# Patient Record
Sex: Male | Born: 1960 | Race: White | Hispanic: No | Marital: Married | Smoking: Former smoker
Health system: Southern US, Community
[De-identification: ages and names within clinical notes are randomized; demographics above are authoritative.]

## PROBLEM LIST (undated history)

## (undated) DIAGNOSIS — E785 Hyperlipidemia, unspecified: Secondary | ICD-10-CM

## (undated) DIAGNOSIS — I10 Essential (primary) hypertension: Secondary | ICD-10-CM

## (undated) DIAGNOSIS — G4730 Sleep apnea, unspecified: Secondary | ICD-10-CM

## (undated) HISTORY — PX: KNEE ARTHROSCOPY: SHX127

## (undated) HISTORY — PX: CARDIOVASCULAR STRESS TEST: SHX262

## (undated) HISTORY — PX: BACK SURGERY: SHX140

## (undated) HISTORY — DX: Sleep apnea, unspecified: G47.30

## (undated) HISTORY — PX: JOINT REPLACEMENT: SHX530

## (undated) HISTORY — PX: KNEE ARTHROSCOPY WITH PATELLAR TENDON REPAIR: SHX5656

---
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
## 2004-11-25 ENCOUNTER — Ambulatory Visit: Payer: Self-pay | Admitting: Cardiology

## 2004-12-01 ENCOUNTER — Ambulatory Visit: Payer: Self-pay

## 2004-12-01 ENCOUNTER — Encounter: Payer: Self-pay | Admitting: Cardiology

## 2005-08-19 ENCOUNTER — Emergency Department (HOSPITAL_COMMUNITY): Admission: EM | Admit: 2005-08-19 | Discharge: 2005-08-19 | Payer: Self-pay | Admitting: Emergency Medicine

## 2006-01-16 HISTORY — PX: OTHER SURGICAL HISTORY: SHX169

## 2006-11-13 ENCOUNTER — Ambulatory Visit: Payer: Self-pay | Admitting: Cardiology

## 2006-11-13 ENCOUNTER — Inpatient Hospital Stay (HOSPITAL_COMMUNITY): Admission: EM | Admit: 2006-11-13 | Discharge: 2006-11-15 | Payer: Self-pay | Admitting: Emergency Medicine

## 2006-11-21 ENCOUNTER — Ambulatory Visit: Payer: Self-pay | Admitting: Cardiology

## 2006-12-06 ENCOUNTER — Ambulatory Visit: Payer: Self-pay | Admitting: Cardiology

## 2007-01-24 ENCOUNTER — Ambulatory Visit: Payer: Self-pay | Admitting: Cardiology

## 2007-03-05 ENCOUNTER — Ambulatory Visit: Payer: Self-pay | Admitting: Cardiology

## 2007-07-09 ENCOUNTER — Ambulatory Visit: Payer: Self-pay | Admitting: Cardiology

## 2007-11-05 ENCOUNTER — Ambulatory Visit: Payer: Self-pay | Admitting: Cardiology

## 2008-01-15 ENCOUNTER — Inpatient Hospital Stay (HOSPITAL_COMMUNITY): Admission: RE | Admit: 2008-01-15 | Discharge: 2008-01-18 | Payer: Self-pay | Admitting: Orthopedic Surgery

## 2008-02-06 ENCOUNTER — Ambulatory Visit: Payer: Self-pay | Admitting: Cardiology

## 2008-02-19 ENCOUNTER — Encounter: Admission: RE | Admit: 2008-02-19 | Discharge: 2008-03-18 | Payer: Self-pay | Admitting: Orthopedic Surgery

## 2008-05-05 ENCOUNTER — Ambulatory Visit: Payer: Self-pay | Admitting: Cardiology

## 2008-05-12 DIAGNOSIS — E785 Hyperlipidemia, unspecified: Secondary | ICD-10-CM | POA: Insufficient documentation

## 2008-05-12 DIAGNOSIS — I10 Essential (primary) hypertension: Secondary | ICD-10-CM | POA: Insufficient documentation

## 2008-11-05 ENCOUNTER — Ambulatory Visit: Payer: Self-pay | Admitting: Cardiology

## 2008-12-18 ENCOUNTER — Encounter (INDEPENDENT_AMBULATORY_CARE_PROVIDER_SITE_OTHER): Payer: Self-pay | Admitting: *Deleted

## 2009-01-16 HISTORY — PX: COLONOSCOPY: SHX174

## 2009-02-02 ENCOUNTER — Ambulatory Visit: Payer: Self-pay | Admitting: Cardiology

## 2009-03-12 ENCOUNTER — Ambulatory Visit: Payer: Self-pay | Admitting: Cardiology

## 2009-03-12 DIAGNOSIS — I214 Non-ST elevation (NSTEMI) myocardial infarction: Secondary | ICD-10-CM | POA: Insufficient documentation

## 2009-03-15 ENCOUNTER — Encounter: Payer: Self-pay | Admitting: Cardiology

## 2009-03-17 ENCOUNTER — Telehealth: Payer: Self-pay | Admitting: Cardiology

## 2009-04-13 ENCOUNTER — Encounter (INDEPENDENT_AMBULATORY_CARE_PROVIDER_SITE_OTHER): Payer: Self-pay | Admitting: *Deleted

## 2010-02-15 NOTE — Progress Notes (Signed)
Summary: questions about medication  Medications Added METOPROLOL SUCCINATE 25 MG XR24H-TAB (METOPROLOL SUCCINATE) Take one tablet by mouth daily       Phone Note Call from Patient Call back at [phone]   Caller: Patient Summary of Call: Pt have questions about medication Metoprolol 100mg  Initial call taken by: Judie Grieve,  March 17, 2009 8:43 AM  Follow-up for Phone Call        The pt states he has been taking Metoprolol succ 25mg  once daily. I explained the wrong dose may have been clicked on in the drop down box in EMR. I will correct this and send the right dose to his pharmacy. He states he will quarter the 100mg  tabs since he has already picked those up.  Follow-up by: Sherri Rad, RN, BSN,  March 17, 2009 10:59 AM    New/Updated Medications: METOPROLOL SUCCINATE 25 MG XR24H-TAB (METOPROLOL SUCCINATE) Take one tablet by mouth daily Prescriptions: METOPROLOL SUCCINATE 25 MG XR24H-TAB (METOPROLOL SUCCINATE) Take one tablet by mouth daily  #30 x 11   Entered by:   Sherri Rad, RN, BSN   Authorized by:   Lenoria Farrier, MD, [facility]   Signed by:   Sherri Rad, RN, BSN on 03/17/2009   Method used:   Electronically to        [facility]  [address] (retail)       [address]       [place], [place]  [address]       Ph: [phone]       Fax: [phone]   RxID:   [id number]

## 2010-02-15 NOTE — Miscellaneous (Signed)
  Clinical Lists Changes  Observations: Added new observation of RS STUDY: TRACER (12/18/2008 12:25)      Research Study Name: TRACER

## 2010-02-15 NOTE — Miscellaneous (Signed)
  Clinical Lists Changes  Observations: Added new observation of RS STUDY: TRACER - study completion 02/02/09 (04/13/2009 11:19)      Research Study Name: TRACER - study completion 02/02/09

## 2010-02-15 NOTE — Assessment & Plan Note (Signed)
Summary: F1Y  Medications Added LOTREL 5-10 MG CAPS (AMLODIPINE BESY-BENAZEPRIL HCL) one tab by mouth once daily ASPIRIN 81 MG TBEC (ASPIRIN) Take one tablet by mouth daily METOPROLOL SUCCINATE 100 MG XR24H-TAB (METOPROLOL SUCCINATE) Take one tablet by mouth daily        Visit Type:  Follow-up Primary Provider:  [facility]  CC:  no complaints.  History of Present Illness: Patient is 50 years old and return for management following a previous non-SLS an MI in 2008. He had catheterization at that time and was thought to have occlusion of a very small vessel. The rest of his arteries were clean. He was enrolled in the pacer trial and he completed this just recently.  Last year he developed a skin rash that we thought may be related to the statin and this was discontinued. We did not put him on another statin because his coronaries were clean.  His other major promise of hypertension.  He's been quite active. 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 He's had no symptoms related to this.  Current Medications (verified): 1)  Lotrel 5-10 Mg Caps (Amlodipine Besy-Benazepril Hcl) .... One Tab By Mouth Once Daily 2)  Aspirin 81 Mg Tbec (Aspirin) .... Take One Tablet By Mouth Daily 3)  Metoprolol Succinate 100 Mg Xr24h-Tab (Metoprolol Succinate) .... Take One Tablet By Mouth Daily  Past History:  Past Medical History: Reviewed history from 05/12/2008 and no changes required. HYPERTENSION, UNSPECIFIED (ICD-401.9) HYPERLIPIDEMIA-MIXED (ICD-272.4)    Review of Systems       ROS is negative except as outlined in HPI.   Vital Signs:  Patient profile:   50 year old male Height:      72 inches Weight:      192 pounds BMI:     26.13 Pulse rate:   69 / minute BP sitting:   116 / 75  (left arm) Cuff size:   regular  Vitals Entered By: Burnett Kanaris, CNA (March 12, 2009 10:36 AM)  Physical Exam  Additional Exam:  Gen.  Well-nourished, in no distress   Neck: No JVD, thyroid not enlarged, no carotid bruits Lungs: No tachypnea, clear without rales, rhonchi or wheezes Cardiovascular: Rhythm regular, PMI not displaced,  heart sounds  normal, no murmurs or gallops, no peripheral edema, pulses normal in all 4 extremities. Abdomen: BS normal, abdomen soft and non-tender without masses or organomegaly, no hepatosplenomegaly. 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 There is a question of occlusion of a very small vessel. He is stable I don't think he requires any further treatment. Question is whether we should treat him as secondary prevention and put him on statins or not. We decided against this previously because he had a reaction to statins. We'll plan to get repeat lab work. His updated medication list for this problem includes:    Lotrel 5-10 Mg Caps (Amlodipine besy-benazepril hcl) ..... One tab by mouth once daily    Aspirin 81 Mg Tbec (Aspirin) .Marland Kitchen... Take one tablet by mouth daily    Metoprolol Succinate 100 Mg Xr24h-tab (Metoprolol succinate) .Marland Kitchen... Take one tablet by mouth daily  Orders: EKG w/ Interpretation (93000)  Problem # 2:  HYPERTENSION, UNSPECIFIED (ICD-401.9)  This  appears controlled on current medications. His updated medication list for this problem includes:    Lotrel 5-10 Mg Caps (Amlodipine besy-benazepril hcl) ..... One tab by mouth once daily    Aspirin 81 Mg Tbec (Aspirin) .Marland Kitchen... Take one tablet by mouth daily    Metoprolol Succinate 100 Mg Xr24h-tab (Metoprolol succinate) .Marland Kitchen... Take one tablet by mouth daily  His updated medication list for this problem includes:    Lotrel 5-10 Mg Caps (Amlodipine besy-benazepril hcl) ..... One tab by mouth once daily     Aspirin 81 Mg Tbec (Aspirin) .Marland Kitchen... Take one tablet by mouth daily    Metoprolol Succinate 100 Mg Xr24h-tab (Metoprolol succinate) .Marland Kitchen... Take one tablet by mouth daily  Patient Instructions: 1)  Your physician recommends that you have FASTING lab work : lipid/liver/cbc/bmet/tsh (414.01;402.10)- @ Dr. Kathi Der office. 2)  We will see you back on an as needed basis. Prescriptions: METOPROLOL SUCCINATE 100 MG XR24H-TAB (METOPROLOL SUCCINATE) Take one tablet by mouth daily  #30 x 11   Entered by:   Sherri Rad, RN, BSN   Authorized by:   Lenoria Farrier, MD, [facility]   Signed by:   Sherri Rad, RN, BSN on 03/12/2009   Method used:   Electronically to        [facility]  [address] (retail)       [address]       [place], [place]  [address]       Ph: [phone]       Fax: [phone]   RxID:   [id number] LOTREL 5-10 MG CAPS (AMLODIPINE BESY-BENAZEPRIL HCL) one tab by mouth once daily  #30 x 11   Entered by:   Sherri Rad, RN, BSN   Authorized by:   Lenoria Farrier, MD, [facility]   Signed by:   Sherri Rad, RN, BSN on 03/12/2009   Method used:   Electronically to        [facility] [address] (retail)       [address]       [place], [place]  [address]       Ph: [phone]       Fax: [phone]   RxID:   [id number]

## 2010-04-13 ENCOUNTER — Encounter: Payer: Self-pay | Admitting: Gastroenterology

## 2010-04-19 NOTE — Letter (Signed)
Summary: Pre Visit Letter Revised  [facility]  [address] [place], [place] [address]   Phone: [phone]  Fax: [phone]        04/13/2010 MRN: [id number]  [name] [address] [place], [place]  [address]             Procedure Date:  05-23-10 9am           Dr Christella Hartigan  Direct Colon   Welcome to the Gastroenterology Division at [facility].    You are scheduled to see a nurse for your pre-procedure visit on 05-09-10 at 9am on the 3rd floor at [facility], [address]  We ask that you try to arrive at our office 15 minutes prior to your appointment time to allow for check-in.  Please take a minute to review the attached form.  If you answer "Yes" to one or more of the questions on the first page, we ask that you call the person listed at your earliest opportunity.  If you answer "No" to all of the questions, please complete the rest of the form and bring it to your appointment.    Your nurse visit will consist of discussing your medical and surgical history, your immediate family medical history, and your medications.   If you are unable to list all of your medications on the form, please bring the medication bottles to your appointment and we will list them.  We will need to be aware of both prescribed and over the counter drugs.  We will need to know exact dosage information as well.    Please be prepared to read and sign documents such as consent forms, a financial agreement, and acknowledgement forms.  If necessary, and with your consent, a friend or relative is welcome to sit-in on the nurse visit with you.  Please bring your insurance card so that we may make a copy of it.  If your insurance requires a referral to see a specialist, please bring your referral form from your primary care physician.  No co-pay is required for this nurse visit.     If you cannot keep your appointment, please call [phone] to cancel or reschedule prior to your  appointment date.  This allows [place] the opportunity to schedule an appointment for another patient in need of care.    Thank you for choosing [facility] for your medical needs.  We appreciate the opportunity to care for you.  Please visit [place] at our website  to learn more about our practice.  Sincerely, The Gastroenterology Division

## 2010-05-02 LAB — BASIC METABOLIC PANEL
BUN: 13 mg/dL (ref 6–23)
BUN: 14 mg/dL (ref 6–23)
CO2: 22 mEq/L (ref 19–32)
CO2: 26 mEq/L (ref 19–32)
Calcium: 8.4 mg/dL (ref 8.4–10.5)
Calcium: 8.5 mg/dL (ref 8.4–10.5)
Chloride: 103 mEq/L (ref 96–112)
Chloride: 105 mEq/L (ref 96–112)
Creatinine, Ser: 0.67 mg/dL (ref 0.4–1.5)
Creatinine, Ser: 0.85 mg/dL (ref 0.4–1.5)
GFR calc Af Amer: >60 mL/min (ref >60)
GFR calc Af Amer: >60 mL/min (ref >60)
GFR calc non Af Amer: >60 mL/min (ref >60)
GFR calc non Af Amer: >60 mL/min (ref >60)
Glucose, Bld: 108 mg/dL — ABNORMAL HIGH (ref 70–99)
Glucose, Bld: 98 mg/dL (ref 70–99)
Potassium: 3.7 mEq/L (ref 3.5–5.1)
Potassium: 4.1 mEq/L (ref 3.5–5.1)
Sodium: 135 mEq/L (ref 135–145)
Sodium: 135 mEq/L (ref 135–145)

## 2010-05-02 LAB — CBC
HCT: 38.5 % — ABNORMAL LOW (ref 39.0–52.0)
HCT: 39.2 % (ref 39.0–52.0)
Hemoglobin: 12.9 g/dL — ABNORMAL LOW (ref 13.0–17.0)
Hemoglobin: 13.2 g/dL (ref 13.0–17.0)
MCHC: 33.6 g/dL (ref 30.0–36.0)
MCHC: 33.7 g/dL (ref 30.0–36.0)
MCV: 90.5 fL (ref 78.0–100.0)
MCV: 91 fL (ref 78.0–100.0)
Platelets: 178 10*3/uL (ref 150–400)
Platelets: 184 10*3/uL (ref 150–400)
RBC: 4.24 MIL/uL (ref 4.22–5.81)
RBC: 4.33 MIL/uL (ref 4.22–5.81)
RDW: 12.6 % (ref 11.5–15.5)
RDW: 12.7 % (ref 11.5–15.5)
WBC: 13.9 10*3/uL — ABNORMAL HIGH (ref 4.0–10.5)
WBC: 14.4 10*3/uL — ABNORMAL HIGH (ref 4.0–10.5)

## 2010-05-02 LAB — PROTIME-INR
INR: 1.7 — ABNORMAL HIGH (ref 0.00–1.49)
INR: 2.1 — ABNORMAL HIGH (ref 0.00–1.49)
Prothrombin Time: 20.7 seconds — ABNORMAL HIGH (ref 11.6–15.2)
Prothrombin Time: 24.9 seconds — ABNORMAL HIGH (ref 11.6–15.2)

## 2010-05-09 ENCOUNTER — Encounter: Payer: BC Managed Care – PPO | Admitting: *Deleted

## 2010-05-23 ENCOUNTER — Other Ambulatory Visit: Payer: Self-pay | Admitting: Gastroenterology

## 2010-05-31 NOTE — H&P (Signed)
NAMETIANDRE, TEALL NO.:  [id number]   MEDICAL RECORD NO.:  [id number]          PATIENT TYPE:  EMS   LOCATION:  MAJO                         FACILITY:  [facility]   PHYSICIAN:  Everardo Beals. Juanda Chance, MD, FACCDATE OF BIRTH:  04-10-1960   DATE OF ADMISSION:  11/13/2006  DATE OF DISCHARGE:                              HISTORY & PHYSICAL   PRIMARY CARE PHYSICIAN:  Dr. Vernon Prey at [facility].   CHIEF COMPLAINT:  Chest pain.   CLINICAL HISTORY:  Mr. Blackwelder is 50 years old and has no prior history  of known heart disease.  While he was riding to work today, he developed  a sudden onset of substernal chest pressure associated with diaphoresis.  His coworkers called EMS, and he came to [facility] via ambulance.  He was  treated with aspirin en route.  His total pain lasted about an hour, and  he was pain free shortly after arrival at [facility] Emergency Room.   He is quite active and works out, and he worked out as recently as  yesterday with no exertional chest pain.   PAST MEDICAL HISTORY:  Hypertension.  He has no past history of diabetes  or hyperlipidemia.   CURRENT MEDICATIONS:  Only Lotrel.   FAMILY HISTORY:  Negative for heart disease.  Both his mother and father  are living and are well.   SOCIAL HISTORY:  Works in the Education officer, community.  He does not  smoke.  He is married, and his wife works as a Patent examiner in  [place].   REVIEW OF SYSTEMS:  Negative.   PHYSICAL EXAMINATION:  VITAL SIGNS:  Blood pressures 160/100 and pulse  76 and regular.  There was no vein distension.  The carotid pulses were  full, and there were no bruits.  CHEST:  Clear without rales or rhonchi.  CARDIAC:  Rhythm was regular.  Heart sounds were normal.  I hear no  murmurs or gallops.  ABDOMEN:  Soft with normal bowel sounds.  There is no  hepatosplenomegaly.  PERIPHERAL PULSES:  Full.  There is no peripheral edema.  MUSCULOSKELETAL:  No  deformities.  SKIN:  Warm and dry.  NEUROLOGIC:  No focal neurological signs.   Electrocardiogram showed 0.5 to 1 mm ST elevation in V1 and V2.   His cardiac markers were positive with an MB of 20 and a CK value with  an MB of 20.   IMPRESSION:  1. Chest pain and positive cardiac markers consistent with a non-ST      elevation myocardial infarction.  2. Hypertension.   PLAN:  We will plan to admit Mr. Jimmye Norman and will plan urgent  catheterization.  We will treat him with Plavix in addition to aspirin,  and we will add unfractionated heparin and a beta blocker.      Bruce Elvera Lennox Juanda Chance, MD, [facility]  Electronically Signed     BRB/MEDQ  D:  11/13/2006  T:  11/13/2006  Job:  [id number]   cc:   Ernestina Penna, M.D.

## 2010-05-31 NOTE — H&P (Signed)
NAMESHONDALE, QUINLEY NO.:  [id number]   MEDICAL RECORD NO.:  [id number]          PATIENT TYPE:  INP   LOCATION:  NA                           FACILITY:  [facility]   PHYSICIAN:  Almedia Balls. [name], M.D. DATE OF BIRTH:  [birth year]   DATE OF ADMISSION:  DATE OF DISCHARGE:                              HISTORY & PHYSICAL   CHIEF COMPLAINTS:  Right knee pain.   HISTORY OF PRESENT ILLNESS:  The patient is a 50 year old male with  worsening right knee pain secondary to osteoarthritis.  The patient has  elected to have a total knee arthroplasty.   PAST MEDICAL HISTORY:  Hypertension and history of possible MI back  (2008).   FAMILY MEDICAL HISTORY:  Coronary artery disease and stroke.   SOCIAL HISTORY:  The patient is married, does not smoke or use alcohol,  and patient of Dr. [name].   DRUG ALLERGIES:  None.   CURRENT MEDICATIONS:  1. Metoprolol 25 mg p.o. daily.  2. Amlodipine and benazepril 5/20 mg p.o. daily.  3. Aspirin 81 mg p.o. daily.  4. Vitamins.   REVIEW OF SYSTEMS:  Pain with ambulation.   PHYSICAL EXAMINATION:  VITAL SIGNS:  Pulse 74, respirations 16, and  blood pressure 160/88.  GENERAL:  The patient is a healthy-appearing 50 year old male in no  acute distress.  Pleasant mood and affect, oriented x3.  HEAD AND NECK:  Cranial nerves II-XII grossly intact.  He has full range  of motion of cervical spine without any tenderness.  CHEST:  Active breath sounds bilaterally.  No wheezes, rhonchi, or  rales.  HEART:  Regular rate and rhythm.  No murmur.  ABDOMEN:  Nontender and nondistended with active bowel sounds.  EXTREMITIES:  Moderate tenderness to right knee with range of motion,  especially in the medial joint line, but neurologically he is intact  with no rashes or edema.   LABORATORY DATA:  X-rays show end-stage osteoarthritis of the right  knee.   IMPRESSION:  End-stage osteoarthritis, right knee.   PLAN OF ACTION:  To have right total  knee arthroplasty by Dr. Malon Kindle.      Thomas B. Durwin Nora, P.A.      Almedia Balls. [name], M.D.  Electronically Signed    TBD/MEDQ  D:  01/02/2008  T:  01/02/2008  Job:  [id number]

## 2010-05-31 NOTE — Assessment & Plan Note (Signed)
[facility]                            CARDIOLOGY OFFICE NOTE   Nicholas, Bray BOHDI LEEDS                       MRN:          [id number]  DATE:02/06/2008                            DOB:          1960-06-29    PRIMARY CARE PHYSICIAN:  Ernestina Penna, MD   [name] is 50 years old returned for management of his coronary  heart disease.  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  In October 2008, he had an episode of diaphoresis and  positive MBs, underwent catheterization, was brought to have occlusion  of very small branch vessel that was not well identified on the  angiogram.  He thought he had a small non-ST-elevation myocardial  infarction.  He was enrolled in the tracer study and has been on tracer  study drug and has done quite well.  Three weeks ago he had total knee  replacement.  He went off his tracer study drug prior to that as well as  aspirin.   He has had no recent chest pain, shortness of breath, or palpitations.   PAST MEDICAL HISTORY:  Significant for hypertension and hyperlipidemia.   CURRENT MEDICATIONS:  1. Lotrel 5/10 daily.  2. Toprol-XL 25 mg every other day.  3. Tracer study drug, which is on hold.  4. Aspirin.  5. Warfarin.   PHYSICAL EXAMINATION:  VITAL SIGNS:  Blood pressure is 113/72 and the  pulse 71 and regular.  There is no venous tension.  Carotid pulses were  full without bruits.  CHEST:  Clear.  CARDIAC:  Rhythm was regular.  There are no murmurs or gallops.  ABDOMEN:  Soft, normal bowel sounds.  EXTREMITIES:  Peripheral pulses are full with no peripheral edema.   Electrocardiogram was normal.   IMPRESSION:  1. Small non-ST-elevation myocardial infarction with probable      occlusion of a small branch vessel by angiography, October 2008.  2. Hypertension.  3. Hyperlipidemia.   RECOMMENDATIONS:  I think [name] is doing well.  He is off the  simvastatin because he had a  rash.  We will get a lipid and liver and  decide about reinstitution of a statin drug.  His coronary arteries were  looked clean with the exception of the proximal small branch occlusion,  so we are really uncertain about whether his problem was indeed related  to cardiovascular disease.  If his cholesterols are close to normal,  then we may be able to get by without it.  If his  cholesterols are  normal, may by without a statin.  I will plan to see him back in  followup in an year and I told him after that I probably turned back  over to Dr. Christell Constant for followup for all his followup preventive care.     Bruce Elvera Lennox Juanda Chance, MD, [facility]  Electronically Signed    BRB/MedQ  DD: 02/06/2008  DT: 02/07/2008  Job #: [id number]

## 2010-05-31 NOTE — Cardiovascular Report (Signed)
[name], [name] NO.:  [id number]   MEDICAL RECORD NO.:  [id number]          PATIENT TYPE:  INP   LOCATION:  2807                         FACILITY:  [facility]   PHYSICIAN:  Veverly Fells. Excell Seltzer, MD  DATE OF BIRTH:  [birth year]   DATE OF PROCEDURE:  DATE OF DISCHARGE:                            CARDIAC CATHETERIZATION   PROCEDURE:  Left heart catheterization, selective coronary angiography,  left ventricular angiography, StarClose of the right femoral artery.   INDICATIONS:  [name] is a 50 year old gentleman who developed  substernal chest pain while driving.  He had an approximate 1 hour  episode of chest pain and diaphoresis.  He  presented to the emergency  room and had elevated CK-MBs.  Of note, he did have normal troponins.  In the setting of worrisome chest pain and abnormal cardiac enzymes, he  was referred for cardiac catheterization.   Risks and indications of procedure were reviewed with the patient and  informed consent was obtained.  Right groin was prepped, draped and  anesthetized with 1% lidocaine using modified Seldinger technique.  A 6-  French sheath was placed in the right femoral artery and 6-French  diagnostic catheters were used for coronary angiography.  An angled  pigtail catheter was inserted into the left ventricle where pressures  were recorded.  Left ventriculogram was performed.  Pullback across the  aortic valve was done at completion of the procedure.  StarClose device  was used to seal femoral arteriotomy.  There were no immediate  complications.   FINDINGS:  Aortic pressure 129/78 with a mean of 104, left ventricular  pressure 133/18.   Coronary angiography:  Left mainstem is angiographically normal and  bifurcates into the LAD and left circumflex.   The LAD is a large-caliber vessel that courses down and wraps around the  left ventricular apex.  It supplies a large first diagonal branch.  There are two perforators at the  origin of the diagonal branch.  There  is a small second diagonal from the midportion of the LAD.  There is no  significant angiographic disease throughout the LAD or diagonal  branches.   The left circumflex is also a large-caliber vessel.  It courses down and  supplies 2 small OM branches followed by a large third OM that  bifurcates into twin vessels and supplies much of the inferolateral  wall.  The AV groove circumflex is small.  There is no significant  angiographic stenosis throughout the left circumflex.   The right coronary artery is dominant and supplies a PDA branch as well  as a small right posterolateral branch.  There is also an acute marginal  branch from the midportion of the vessel.  There is no significant  angiographic stenosis throughout the right coronary artery.   Left ventricular function is normal.  The LVEF is 60%.  There is no  mitral regurgitation.   ASSESSMENT:  1. Normal coronary arteries.  2. Normal left ventricular function.   DISCUSSION:  I suspect noncardiac chest pain.  We will repeat a CPK in  the morning and continue IV fluids  throughout the day.      Veverly Fells. Excell Seltzer, MD  Electronically Signed     MDC/MEDQ  D:  11/13/2006  T:  11/13/2006  Job:  [id number]

## 2010-05-31 NOTE — Discharge Summary (Signed)
[name], [name] NO.:  [id number]   MEDICAL RECORD NO.:  [id number]          PATIENT TYPE:  INP   LOCATION:  5010                         FACILITY:  [facility]   PHYSICIAN:  Almedia Balls. [name], M.D. DATE OF BIRTH:  [birth year]   DATE OF ADMISSION:  01/15/2008  DATE OF DISCHARGE:  01/18/2008                               DISCHARGE SUMMARY   ADMITTING DIAGNOSIS:  End-stage osteoarthritis, right knee.   DISCHARGE DIAGNOSIS:  End-stage osteoarthritis, right knee.   PROCEDURE PERFORMED:  Right total knee replacement performed on January 15, 2008, by Dr. [name].   CONSULTING SERVICES:  Physical Therapy, Occupational Therapy, Discharge  Planning, and Pharmacy.   HISTORY OF PRESENT ILLNESS:  The patient is a 50 year old male with a  history of worsening right knee arthritis.  The patient has had  progressive pain and functional loss.  The patient has failed extensive  period of conservative management including injections, anti-  inflammatories, activity modification, and he now presents for operative  total knee arthroplasty to restore function and eliminate pain.  For  further details of the patient's past medical history and physical  examination, please see the medical record.   HOSPITAL COURSE:  The patient was admitted to Orthopedics on January 15, 2008.  The patient underwent successful total knee arthroplasty  performed on January 15, 2008.  He was taken postoperatively to the  floor where he underwent the total knee protocol with anticoagulation  using Coumadin.  He also had DVT prophylaxis with TED hose and  sequential compression devices.  The patient was mobilized thoroughly  with Physical Therapy and Occupational Therapy for ADLs.  The patient  was maintained on a regular diet.  He was comfortable on oral  medications including Percocet and Robaxin prior to discharge.  He was  discharged to home with Home Health Physical Therapy and Occupational  Therapy as well as Nursing for Coumadin monitoring.  He was discharged  on January 18, 2008, on his preadmission medications as well.  He will be  following up in 2 weeks with [facility].  He is  weightbearing as tolerated.      Almedia Balls. [name], M.D.  Electronically Signed     SRN/MEDQ  D:  02/24/2008  T:  02/24/2008  Job:  [id number]

## 2010-05-31 NOTE — Assessment & Plan Note (Signed)
[facility]                            CARDIOLOGY OFFICE NOTE   CARSTEN, CARSTARPHEN                       MRN:          [id number]  DATE:01/24/2007                            DOB:          February 13, 1960    PRIMARY CARE PHYSICIAN:  [name], M.D.   CLINICAL HISTORY:  [name] is 50 years old and returns for follow-  up management of his coronary heart disease.  He lives in [place] and  works for the [employer], community.  Last October, he developed  an episode of severe diaphoresis and was hospitalized with positive MBs.  He was studied by Dr. Excell Bray and had what was felt to be occlusion of a  very small branch vessel, although this was not very well identified on  the angiogram.  He was treated as a small non-ST-elevation myocardial  infarction.  His LV function was normal.   He was also enrolled in the tracer study and is on a tracer study drug.   PAST MEDICAL HISTORY:  1. Hypertension.  2. Hyperlipidemia.   CURRENT MEDICATIONS:  Include Lotrel simvastatin, Toprol, aspirin, and  traced study drug.   PHYSICAL EXAMINATION:  VITAL SIGNS:  Blood pressure is 146/83, and the  pulse 63 and regular.  NECK:  There was no venous tension.  The carotid pulses were full,  without bruits.  CHEST:  Clear.  CARDIAC:  Rhythm was regular.  He had no murmurs or gallops.  ABDOMEN:  Soft, without organomegaly.  Peripheral pulses were full.  There was no peripheral edema.   An EKG was normal.   IMPRESSION:  1. Small non-ST-elevation myocardial infarction, with probable      occlusion of a small branch vessel by angiography, October 2008.  2. Hypertension.  3. Hyperlipidemia.   RECOMMENDATIONS:  I think [name] is doing quite well.  He has gained  some weight, and his blood pressure is borderline.  I encouraged him to  lose weight and watch the salt in his diet.  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  I will plan to see him back in a year, or sooner if he has any  recurrent difficulties.  We be seen by the tracer study nurses in  followup as well.   ADDENDUM:  He has not had a lipid profile since he started on  simvastatin.  We will arrange for him to have this through Dr. Kathi Bray  office and asked him to fax [place] the results.     Nicholas Elvera Lennox Juanda Chance, MD, [facility]  Electronically Signed    BRB/MedQ  DD: 01/24/2007  DT: 01/25/2007  Job #: [id number]

## 2010-05-31 NOTE — Discharge Summary (Signed)
[name], CORP                ACCOUNT NO.:  [id number]   MEDICAL RECORD NO.:  [id number]          PATIENT TYPE:  INP   LOCATION:  3703                         FACILITY:  [facility]   PHYSICIAN:  Everardo Beals. Juanda Chance, MD, FACCDATE OF BIRTH:  Dec 04, 1960   DATE OF ADMISSION:  11/13/2006  DATE OF DISCHARGE:  11/15/2006                               DISCHARGE SUMMARY   PRIMARY CARE PHYSICIAN:  Dr. [name] at [facility]   PRIMARY CARDIOLOGIST:  Dr. Charlies Constable   PROCEDURES PERFORMED DURING HOSPITALIZATION:  Cardiac catheterization  completed by Dr. Tonny Bollman on November 13, 2006 revealing normal  coronary arteries, normal left ventricular function.   FINAL DISCHARGE DIAGNOSES:  1. Non-ST-elevation myocardial infarction; treat medically.        A:  Status post cardiac catheterization revealing normal coronary  arteries with normal left ventricular systolic function.  Male with no  prior history of known cardiac disease who was riding to work today  developing sudden onset of substernal chest pressure associated with  diaphoresis; his co-workers called EMS and he came to [facility] via  ambulance.  He was treated with aspirin en route.  The pain lasted  approximately one hour and review of cardiac catheterization revealed  late contrast in a tiny branch vessel; suspect that that was etiology of  MI; LV function was found to be normal.  Catheterization was ordered per  Dr. Juanda Chance to evaluate for coronary artery disease.  1. Hypertension.  2. Dyslipidemia.  3. Was completed emergently per Dr. Tonny Bollman revealing normal      coronary arteries, normal left ventricular systolic function.   HOSPITAL COURSE:  Mr. [name] is a 50 year old male with no prior history  of known cardiac disease, who was riding to work day of admission  developing sudden onset of substernal chest pain associated with  diaphoresis; his co-workers called EMS and he was brought to  [facility].  The patient was seen and examined by Dr. Charlies Constable.  Cardiac markers  were positive with a MB of 20, a CK value of 20.  The patient's EKG  showed 0.5 to 1 mm ST elevation in V1 and V2.  The patient was taken  emergently to cardiac catheterization lab where Dr. Tonny Bollman  performed emergent cardiac catheterization.  Please see Dr. Earmon Bray  thorough cardiac catheterization note for more details.  The patient was  found to have normal coronaries and normal left ventricular function,  however after review of cardiac catheterization films, there is late  contrast in a tiny branch vessel; suspect that that is the etiology of  the MI and LV function was found to be normal.  He is to be treated  medically with aspirin, Plavix, statin and 2-D echo was to be performed.   Echocardiogram revealed overall left ventricular systolic function  normal; left ventricular ejection fraction estimated at being 55 to 60%;  there was no diagnostic evidence of left ventricular regional wall  motion abnormalities; left ventricular wall thickness was mildly  increased; features were consistent with mild diastolic dysfunction; the  left atrium was mildly dilated.   The patient recovered well after cardiac catheterization with no  evidence of bleeding, hematoma or infection at the cath site.  The  patient had no recurrence of chest discomfort.  The patient was started  on Lopressor 25 mg b.i.d. along with aspirin and simvastatin 40 mg  daily.  On discharge, the patient was placed on long-acting Toprol, also  on Plavix 75 mg daily.  He was to continue on his Lotrel 5/10 mg once a  day.   DISCHARGE LABS:  Initial point-of-care markers for cardiac enzymes were  negative, however subsequent cardiac markers revealed a troponin of 6.80  and 7.66, respectively.  The patient's CK was 2395 with a CK-MB of 117.1  with followup CK-MB of 65.0 and CK of 3012.  Final cardiac enzymes  revealed a troponin  of 5.30, CK-MB of 24.3 and a CK of 2532.   Hemoglobin 14.6, hematocrit 42.6, white blood cells 11.3, platelets 215.  Sodium 140, potassium 3.5, chloride 106, CO2 27, glucose 89, BUN 8,  creatinine 0.81.  Cholesterol 154, triglycerides 65, HDL 34, LDL 107.  TSH 1.921.  Magnesium 2.4.   EKG on discharge revealing sinus rhythm with first-degree AV block,  incomplete right bundle-branch block, ventricular rate of 64 beats per  minute.   Chest x-ray revealed no acute disease.   Ultrasound of the abdomen secondary to abdominal pain revealed negative  abdominal ultrasound, no gallstones.   DISCHARGE MEDICATIONS:  1. Lotrel 5/10 mg once a day.  2. Plavix 75 mg daily.  3. Aspirin 325 mg daily.  4. Simvastatin 40 mg daily.  5. Toprol XL 25 mg daily.  6. Nitroglycerin sublingual p.r.n. chest pain.   ALLERGIES:  No known drug allergies.   FOLLOWUP PLANS AND APPOINTMENTS:  1. The patient is to follow up with Dr. Charlies Constable on November 5 at      2:15 p.m. for continued cardiac management.  2. The patient is to follow up with his primary care physician listed      above for continued medical management.  3. The patient has been given post cardiac catheterization      instructions with particular emphasis on the right groin site for      evidence of hematoma, bleeding, signs of infection or severe pain.   Time spent with the patient to include physician time:  45 minutes.     [name]. Nicholas Bishop, NP      Everardo Beals. Juanda Chance, MD, [facility]  Electronically Signed   KML/MEDQ  D:  11/15/2006  T:  11/15/2006  Job:  [id number]   cc:   Ernestina Penna, M.D.

## 2010-05-31 NOTE — Assessment & Plan Note (Signed)
[facility]                            CARDIOLOGY OFFICE NOTE   [name], [name]                       MRN:          [id number]  DATE:11/21/2006                            DOB:          1960/05/03    PRIMARY CARE PHYSICIAN:  Ernestina Penna, M.D.   CLINICAL HISTORY:  The patient is a 50 year old gentleman who is from  [place] and works in the Education officer, community.  On October 28 he  had an episode of severe diaphoresis while at work and was brought to  [facility] by ambulance.  His electrocardiogram showed minimal  changes, but his CK-MB's were positive and he was studied by Dr. Excell Seltzer.  His coronaries were normal with the exception of a staining in one area  on later films which we thought represented occlusion of a small branch.  Based on this, we thought he did have a small non-ST elevation  infarction.  He has done well since discharge with no recurrent chest  pain, shortness of breath, or palpitations.   PAST MEDICAL HISTORY:  Significant for hyperlipidemia.  While he was in  the hospital his cholesterol was 154, HDL 34, and LDL 107.  He also had  a history of hypertension.   PHYSICAL EXAMINATION:  VITAL SIGNS:  The blood pressure was 131/83 and  the pulse 68 and regular.  NECK:  There was no venous distention.  The carotid pulses were full  without bruits.  CHEST:  Clear.  HEART:  Rhythm was regular.  No murmurs or gallops.  ABDOMEN:  Soft with normal bowel sounds.  EXTREMITIES:  Peripheral pulses were full and there was no peripheral  edema.   An electrocardiogram was normal.   IMPRESSION:  1. Recent small non-ST elevation myocardial infarction.  2. Hypertension.  3. Hyperlipidemia.   RECOMMENDATIONS:  I think the patient is doing quite well.  I think he  can go to work next week which will be two weeks post his very small  infarct.  We will plan to continue the Plavix for six months and then  probably stop that  after that.  He is also on the Tracer study drug as  well as Toprol, Simvastatin, aspirin, and Lotrel.  I will plan to see  him back in two months.  I told him he could ease back into his exercise  program beginning next week.    Bruce Elvera Lennox Juanda Chance, MD, [facility]  Electronically Signed   BRB/MedQ  DD: 11/21/2006  DT: 11/22/2006  Job #: [id number]   cc:   Ernestina Penna, M.D.

## 2010-05-31 NOTE — H&P (Signed)
[name], [name]                ACCOUNT NO.:  [id number]   MEDICAL RECORD NO.:  [id number]          PATIENT TYPE:  INP   LOCATION:  3703                         FACILITY:  [facility]   PHYSICIAN:  Everardo Beals. Juanda Chance, MD, FACCDATE OF BIRTH:  [birth year]   DATE OF ADMISSION:  11/13/2006  DATE OF DISCHARGE:                              HISTORY & PHYSICAL   No dictation for this job.      Bruce Elvera Lennox Juanda Chance, MD, [facility]  Electronically Signed     BRB/MEDQ  D:  11/13/2006  T:  11/13/2006  Job:  [id number]

## 2010-05-31 NOTE — Op Note (Signed)
NAMEBASTIEN, STRAWSER NO.:  [id number]   MEDICAL RECORD NO.:  [id number]          PATIENT TYPE:  INP   LOCATION:  2550                         FACILITY:  [facility]   PHYSICIAN:  Almedia Balls. [name], M.D. DATE OF BIRTH:  01-11-1961   DATE OF PROCEDURE:  01/15/2008  DATE OF DISCHARGE:                               OPERATIVE REPORT   PREOPERATIVE DIAGNOSIS:  Right knee end-stage osteoarthritis.   POSTOPERATIVE DIAGNOSIS:  Right knee end-stage osteoarthritis.   PROCEDURE PERFORMED:  Right total knee replacement using DePuy Sigma  rotating platform prosthesis.   SURGEON:  Almedia Balls. [name], MD   ASSISTANT:  Donnie Coffin. Dixon, PAC.   ANESTHESIA:  General anesthesia plus femoral block anesthesia was used.   ESTIMATED BLOOD LOSS:  Minimal.   TOURNIQUET TIME:  1 hour 30 minutes at 300 mmHg.   URINE OUTPUT:  450 mL.   FLUID REPLACEMENT:  2300 mL crystalloid.   INDICATIONS:  The patient is a 50-year-old male with worsening arthritis  in the right knee.  The patient has had progressive joint space  narrowing, marginal osteophyte formation and functional loss.  He has  failed conservative management consisting injections,  antiinflammatories, and activity modifications.  The patient presents  now for operative treatment to restore function and eliminate pain.  Informed consent was obtained.   DESCRIPTION OF PROCEDURE:  After an adequate level of anesthesia was  achieved, the patient was positioned supine on the operating table.  A  nonsterile tourniquet was placed on right proximal thigh.  The right leg  was sterilely prepped and draped in the usual manner.  __________  exsanguination of the limb using an Esmarch bandage, we elevated the  tourniquet to 300 mmHg.  A longitudinal midline incision was created  with the knee in flexion.  Medial parapatellar arthrotomy was created  with inverted patella to right lateral patellofemoral ligament.  Distal  femur entered using a  step-cut drill.  A 10 mm of disk resection was  performed set on 5 degrees right.  We then incised the femur to a size 4  and performed anterior, posterior, and chamfer cuts using a 4-in-1  block.  We removed excess posterior bone using 1/2 inch curved osteotome  from the posterior aspect of the femur and divided PCL, ACL, and removed  meniscal tissue.  Subluxed the tibia anteriorly.  We then performed a 90-  degree perpendicular cut to the long axis of the tibia with 0 degree  posterior slope.  Next, we checked gaps.  Both flexion and extension  gaps were symmetric at 10 mm.  We then went and incised her tibia to  size 4 and then performed a modular drilling keel punch and placed the  tibial component in place.  We were happy with our alignment,  perpendicular to the long axis of tibia.  We then again checked for  extra posterior bone on the posterior aspect of the femur to make sure  that was clear.  Posterior capsule released off the notch posteriorly.  We then went ahead and performed our box cut using box cut  guide for the  size 4 right and then placed a 4 right femur in place.  We were happy  with 12.5 insert in place with full extension.  We then resurfaced the  patella going up from 26 down to 16 mm thickness and placing of 38  patellar button.  Once that was in place, we checked the tracking.  We  felt the tracking was excellent, no hand technique.  It was a little big  tight in the lateral gutter but it did not affect tracking.  I went  ahead and did a lateral release to make sure that we would not have any  undue tension over there, more distal than proximal.  This seemed to  loosen up the patella and it tracked even better even though there were  particularly no tracking issues noted other than just some tightness  subjectively on that lateral side.  At this point, I went ahead and  removed all trial components.  We plugged in the femur with available  bone.  We then thoroughly  pulse irrigated and then dried the femur.  We  cemented the components into place using third generation cement  techniques.  I placed the knee in full extension with 12.5 insert and  allowed the cement to harden.  I then removed excess bone using quarter-  inch curved osteotome and trialed again with 12.5.  We were happy with  our extension and flexion stability and then replaced the trial 12.5  with the real 12.5 polyinsert.  We reduced the knee and again took it  through a full range of motion.  Excellent tracking and balance was  noted.  We closed the parapatellar arthrotomy with #1 Vicryl suture in  figure-of-eight, followed by 0 Vicryl and 2-0 Vicryl layered  subcutaneous closure and 4-0 Monocryl for skin.  Steri-strips applied  followed by sterile dressing.  The patient tolerated the surgery well.      Almedia Balls. [name], M.D.  Electronically Signed     SRN/MEDQ  D:  01/15/2008  T:  01/16/2008  Job:  [id number]

## 2010-10-21 LAB — BASIC METABOLIC PANEL
BUN: 11 mg/dL (ref 6–23)
CO2: 26 mEq/L (ref 19–32)
Calcium: 9.8 mg/dL (ref 8.4–10.5)
Chloride: 104 mEq/L (ref 96–112)
Creatinine, Ser: 0.81 mg/dL (ref 0.4–1.5)
GFR calc Af Amer: >60 mL/min (ref >60)
GFR calc non Af Amer: >60 mL/min (ref >60)
Glucose, Bld: 104 mg/dL — ABNORMAL HIGH (ref 70–99)
Potassium: 4.3 mEq/L (ref 3.5–5.1)
Sodium: 140 mEq/L (ref 135–145)

## 2010-10-21 LAB — CBC
HCT: 41.6 % (ref 39.0–52.0)
HCT: 47.5 % (ref 39.0–52.0)
Hemoglobin: 14 g/dL (ref 13.0–17.0)
Hemoglobin: 16.2 g/dL (ref 13.0–17.0)
MCHC: 33.7 g/dL (ref 30.0–36.0)
MCHC: 34.1 g/dL (ref 30.0–36.0)
MCV: 88.9 fL (ref 78.0–100.0)
MCV: 90.2 fL (ref 78.0–100.0)
Platelets: 200 10*3/uL (ref 150–400)
Platelets: 215 10*3/uL (ref 150–400)
RBC: 4.61 MIL/uL (ref 4.22–5.81)
RBC: 5.35 MIL/uL (ref 4.22–5.81)
RDW: 12.4 % (ref 11.5–15.5)
RDW: 12.9 % (ref 11.5–15.5)
WBC: 13.9 10*3/uL — ABNORMAL HIGH (ref 4.0–10.5)
WBC: 6.2 10*3/uL (ref 4.0–10.5)

## 2010-10-21 LAB — BASIC METABOLIC PANEL WITH GFR
BUN: 10 mg/dL (ref 6–23)
CO2: 26 meq/L (ref 19–32)
Calcium: 8.4 mg/dL (ref 8.4–10.5)
Chloride: 99 meq/L (ref 96–112)
Creatinine, Ser: 0.85 mg/dL (ref 0.4–1.5)
GFR calc non Af Amer: >60 mL/min
Glucose, Bld: 133 mg/dL — ABNORMAL HIGH (ref 70–99)
Potassium: 3.9 meq/L (ref 3.5–5.1)
Sodium: 132 meq/L — ABNORMAL LOW (ref 135–145)

## 2010-10-21 LAB — DIFFERENTIAL
Basophils Absolute: 0 10*3/uL (ref 0.0–0.1)
Basophils Relative: 0 % (ref 0–1)
Eosinophils Absolute: 0.1 10*3/uL (ref 0.0–0.7)
Eosinophils Relative: 2 % (ref 0–5)
Lymphocytes Relative: 29 % (ref 12–46)
Lymphs Abs: 1.8 10*3/uL (ref 0.7–4.0)
Monocytes Absolute: 0.8 10*3/uL (ref 0.1–1.0)
Monocytes Relative: 12 % (ref 3–12)
Neutro Abs: 3.5 10*3/uL (ref 1.7–7.7)
Neutrophils Relative %: 57 % (ref 43–77)

## 2010-10-21 LAB — TYPE AND SCREEN
ABO/RH(D): O POS
Antibody Screen: NEGATIVE

## 2010-10-21 LAB — URINALYSIS, ROUTINE W REFLEX MICROSCOPIC
Bilirubin Urine: NEGATIVE
Glucose, UA: NEGATIVE mg/dL
Hgb urine dipstick: NEGATIVE
Ketones, ur: NEGATIVE mg/dL
Nitrite: NEGATIVE
Protein, ur: NEGATIVE mg/dL
Specific Gravity, Urine: 1.006 (ref 1.005–1.030)
Urobilinogen, UA: 0.2 mg/dL (ref 0.0–1.0)
pH: 7 (ref 5.0–8.0)

## 2010-10-21 LAB — PROTIME-INR
INR: 0.9 (ref 0.00–1.49)
INR: 1 (ref 0.00–1.49)
Prothrombin Time: 12.1 s (ref 11.6–15.2)
Prothrombin Time: 13.8 s (ref 11.6–15.2)

## 2010-10-21 LAB — APTT: aPTT: 26 seconds (ref 24–37)

## 2010-10-21 LAB — ABO/RH: ABO/RH(D): O POS

## 2010-10-26 LAB — CBC
HCT: 41.9
HCT: 42.6
HCT: 42.7
HCT: 43.6
Hemoglobin: 14.2
Hemoglobin: 14.6
Hemoglobin: 14.8
Hemoglobin: 14.8
MCHC: 33.8
MCHC: 34
MCHC: 34.2
MCHC: 34.7
MCV: 88.3
MCV: 88.5
MCV: 88.9
MCV: 89.4
Platelets: 203
Platelets: 209
Platelets: 215
Platelets: 222
RBC: 4.69
RBC: 4.79
RBC: 4.84
RBC: 4.93
RDW: 12.6
RDW: 12.6
RDW: 12.7
RDW: 12.9
WBC: 10
WBC: 10.3
WBC: 11.3 — ABNORMAL HIGH
WBC: 9.2

## 2010-10-26 LAB — DIFFERENTIAL
Basophils Absolute: 0
Basophils Absolute: 0.1
Basophils Absolute: 0.1
Basophils Relative: 0
Basophils Relative: 1
Basophils Relative: 1
Eosinophils Absolute: 0
Eosinophils Absolute: 0.1
Eosinophils Absolute: 0.1
Eosinophils Relative: 1
Eosinophils Relative: 1
Eosinophils Relative: 1
Lymphocytes Relative: 15
Lymphocytes Relative: 26
Lymphocytes Relative: 28
Lymphs Abs: 1.5
Lymphs Abs: 2.4
Lymphs Abs: 3.2
Monocytes Absolute: 0.8 — ABNORMAL HIGH
Monocytes Absolute: 1 — ABNORMAL HIGH
Monocytes Absolute: 1.2 — ABNORMAL HIGH
Monocytes Relative: 10
Monocytes Relative: 11
Monocytes Relative: 9
Neutro Abs: 5.8
Neutro Abs: 6.8
Neutro Abs: 7.4
Neutrophils Relative %: 60
Neutrophils Relative %: 63
Neutrophils Relative %: 74

## 2010-10-26 LAB — POCT CARDIAC MARKERS
CKMB, poc: 12.6
CKMB, poc: 16
Myoglobin, poc: >500
Myoglobin, poc: >500
Operator id: 294501
Operator id: 294501
Troponin i, poc: <0.05
Troponin i, poc: <0.05

## 2010-10-26 LAB — URINALYSIS, ROUTINE W REFLEX MICROSCOPIC
Bilirubin Urine: NEGATIVE
Glucose, UA: NEGATIVE
Hgb urine dipstick: NEGATIVE
Ketones, ur: 15 — AB
Nitrite: NEGATIVE
Protein, ur: NEGATIVE
Specific Gravity, Urine: 1.012
Urobilinogen, UA: 0.2
pH: 6.5

## 2010-10-26 LAB — COMPREHENSIVE METABOLIC PANEL
ALT: 29
AST: 76 — ABNORMAL HIGH
Albumin: 4.2
Alkaline Phosphatase: 62
BUN: 13
CO2: 26
Calcium: 9
Chloride: 102
Creatinine, Ser: 0.9
GFR calc Af Amer: >60
GFR calc non Af Amer: >60
Glucose, Bld: 103 — ABNORMAL HIGH
Potassium: 3.7
Sodium: 138
Total Bilirubin: 1.1
Total Protein: 6.7

## 2010-10-26 LAB — BASIC METABOLIC PANEL
BUN: 8
CO2: 27
Calcium: 8.7
Chloride: 106
Creatinine, Ser: 0.81
GFR calc Af Amer: >60
GFR calc non Af Amer: >60
Glucose, Bld: 89
Potassium: 3.5
Sodium: 140

## 2010-10-26 LAB — MAGNESIUM: Magnesium: 2.4

## 2010-10-26 LAB — LIPID PANEL
Cholesterol: 154
HDL: 34 — ABNORMAL LOW
LDL Cholesterol: 107 — ABNORMAL HIGH
Total CHOL/HDL Ratio: 4.5
Triglycerides: 65
VLDL: 13

## 2010-10-26 LAB — CARDIAC PANEL(CRET KIN+CKTOT+MB+TROPI)
CK, MB: 117.1 — ABNORMAL HIGH
CK, MB: 24.3 — ABNORMAL HIGH
CK, MB: 65 — ABNORMAL HIGH
Relative Index: 1
Relative Index: 2.2
Relative Index: 4.9 — ABNORMAL HIGH
Total CK: 2395 — ABNORMAL HIGH
Total CK: 2532 — ABNORMAL HIGH
Total CK: 3012 — ABNORMAL HIGH
Troponin I: 16.8
Troponin I: 5.3
Troponin I: 7.66

## 2010-10-26 LAB — TROPONIN I: Troponin I: 0.03

## 2010-10-26 LAB — CK TOTAL AND CKMB (NOT AT ARMC)
CK, MB: 20.6 — ABNORMAL HIGH
Relative Index: 0.8
Total CK: 2490 — ABNORMAL HIGH

## 2010-10-26 LAB — TSH: TSH: 1.921

## 2010-10-26 LAB — PROTIME-INR
INR: 1
Prothrombin Time: 12.9

## 2010-10-26 LAB — APTT: aPTT: 27

## 2012-02-02 ENCOUNTER — Encounter (HOSPITAL_COMMUNITY): Payer: Self-pay | Admitting: Pharmacy Technician

## 2012-02-07 NOTE — Pre-Procedure Instructions (Signed)
[name]  02/07/2012   Your procedure is scheduled on:  Friday, January 31st.  Report to [facility] at 8:00 AM.  Call this number if you have problems the morning of surgery: [phone]   Remember:   Do not eat food or drink liquids after midnight.    Take these medicines the morning of surgery with A SIP OF WATER: Metoprolol (Toprol XL).   Do not wear jewelry, make-up or nail polish.  Do not wear lotions, powders, or perfumes. You may wear deodorant.  Do not shave 48 hours prior to surgery. Men may shave face and neck.  Do not bring valuables to the hospital.  Contacts, dentures or bridgework may not be worn into surgery.  Leave suitcase in the car. After surgery it may be brought to your room.  For patients admitted to the hospital, checkout time is 11:00 AM the day of  discharge.   Patients discharged the day of surgery will not be allowed to drive  home.  Name and phone number of your driver: NA     Special Instructions: Shower using CHG 2 nights before surgery and the night before surgery.  If you shower the day of surgery use CHG.  Use special wash - you have one bottle of CHG for all showers.  You should use approximately 1/3 of the bottle for each shower.    Please read over the following fact sheets that you were given: Pain Booklet, Coughing and Deep Breathing, Blood Transfusion Information and Surgical Site Infection Prevention

## 2012-02-08 ENCOUNTER — Encounter (HOSPITAL_COMMUNITY)
Admission: RE | Admit: 2012-02-08 | Discharge: 2012-02-08 | Disposition: A | Discharge status: Home / Self Care | Payer: BC Managed Care – PPO | Source: Ambulatory Visit | Attending: Orthopedic Surgery | Admitting: Orthopedic Surgery

## 2012-02-08 ENCOUNTER — Encounter (HOSPITAL_COMMUNITY): Payer: Self-pay

## 2012-02-08 ENCOUNTER — Other Ambulatory Visit (HOSPITAL_COMMUNITY): Payer: BC Managed Care – PPO

## 2012-02-08 ENCOUNTER — Encounter (HOSPITAL_COMMUNITY)
Admission: RE | Admit: 2012-02-08 | Discharge: 2012-02-08 | Disposition: A | Discharge status: Home / Self Care | Payer: BC Managed Care – PPO | Source: Ambulatory Visit | Attending: Anesthesiology | Admitting: Anesthesiology

## 2012-02-08 HISTORY — DX: Essential (primary) hypertension: I10

## 2012-02-08 HISTORY — DX: Hyperlipidemia, unspecified: E78.5

## 2012-02-08 LAB — BASIC METABOLIC PANEL
BUN: 13 mg/dL (ref 6–23)
CO2: 27 mEq/L (ref 19–32)
Calcium: 9.7 mg/dL (ref 8.4–10.5)
Chloride: 104 mEq/L (ref 96–112)
Creatinine, Ser: 0.86 mg/dL (ref 0.50–1.35)
GFR calc Af Amer: >90 mL/min (ref >90)
GFR calc non Af Amer: >90 mL/min (ref >90)
Glucose, Bld: 80 mg/dL (ref 70–99)
Potassium: 4.3 mEq/L (ref 3.5–5.1)
Sodium: 141 mEq/L (ref 135–145)

## 2012-02-08 LAB — CBC
HCT: 46.5 % (ref 39.0–52.0)
Hemoglobin: 16.3 g/dL (ref 13.0–17.0)
MCH: 31.2 pg (ref 26.0–34.0)
MCHC: 35.1 g/dL (ref 30.0–36.0)
MCV: 89.1 fL (ref 78.0–100.0)
Platelets: 217 10*3/uL (ref 150–400)
RBC: 5.22 MIL/uL (ref 4.22–5.81)
RDW: 12.7 % (ref 11.5–15.5)
WBC: 9 10*3/uL (ref 4.0–10.5)

## 2012-02-08 LAB — SURGICAL PCR SCREEN
MRSA, PCR: NEGATIVE
Staphylococcus aureus: NEGATIVE

## 2012-02-08 LAB — TYPE AND SCREEN
ABO/RH(D): O POS
Antibody Screen: NEGATIVE

## 2012-02-13 NOTE — H&P (Signed)
  [name]. is an 52 y.o. male.    Chief Complaint: left knee pain    HPI: Pt is a 52 y.o. male complaining of left pain for multiple years. Pain had continually increased since the beginning. X-rays in the clinic show end-stage arthritic changes of the left knee. Pt has tried various conservative treatments which have failed to alleviate their symptoms, including shots and therapy. Various options are discussed with the patient. Risks, benefits and expectations were discussed with the patient. Patient understand the risks, benefits and expectations and wishes to proceed with surgery.   PCP:  [name], MD  D/C Plans:  Home with HHPT on Monday  PMH: Past Medical History  Diagnosis Date  . Hypertension     on meds for 8 years  . Hyperlipemia     PSH: Past Surgical History  Procedure Date  . Joint replacement   . Rt knee replacement 2008  . Knee arthroscopy   . Knee arthroscopy with patellar tendon repair   . Cardiovascular stress test     Social History:  reports that he has quit smoking. His smoking use included Cigarettes. He has a 1 pack-year smoking history. He does not have any smokeless tobacco history on file. He reports that he does not drink alcohol or use illicit drugs.  Allergies:  No Known Allergies  Medications: No current facility-administered medications for this encounter.   Current Outpatient Prescriptions  Medication Sig Dispense Refill  . amLODipine-benazepril (LOTREL) 5-10 MG per capsule Take 1 capsule by mouth daily.      Marland Kitchen aspirin EC 81 MG tablet Take 81 mg by mouth daily.      . calcium citrate-vitamin D (CITRACAL+D) 315-200 MG-UNIT per tablet Take 1 tablet by mouth 2 (two) times daily.      . metoprolol succinate (TOPROL-XL) 25 MG 24 hr tablet Take 25 mg by mouth daily.      . niacin 50 MG tablet Take 50 mg by mouth daily with breakfast.        No results found for this or any previous visit (from the past 48 hour(s)). No results  found.  ROS: Pain with rom of the left lower extremity Pain with ambulation Otherwise ros negative  Physical Exam: BP:   152/82  ;  HR:   70  ; Resp:   14  : Alert and oriented 52 y.o. male in no acute distress Cranial nerves 2-12 intact Cervical spine: full rom with no tenderness, nv intact distally Chest: active breath sounds bilaterally, no wheeze rhonchi or rales Heart: regular rate and rhythm, no murmur Abd: non tender non distended with active bowel sounds Hip is stable with rom  Left knee with moderate pain with rom and crepitus nv intact distally Normal heel toe gait  Assessment/Plan Assessment: left knee endstage osteoarthritis    Plan: Patient will undergo a left total knee arthroplasty by Dr. [name] at [facility]. Risks benefits and expectations were discussed with the patient. Patient understand risks, benefits and expectations and wishes to proceed.

## 2012-02-15 MED ORDER — CEFAZOLIN SODIUM-DEXTROSE 2-3 GM-% IV SOLR
2.0000 g | INTRAVENOUS | Status: AC
  Administered 2012-02-16: 2 g via INTRAVENOUS
  Filled 2012-02-15: qty 50

## 2012-02-15 MED ORDER — CHLORHEXIDINE GLUCONATE 4 % EX LIQD: 60.0000 mL | Freq: Once | CUTANEOUS | Status: DC

## 2012-02-16 ENCOUNTER — Ambulatory Visit (HOSPITAL_COMMUNITY): Payer: BC Managed Care – PPO | Admitting: Anesthesiology

## 2012-02-16 ENCOUNTER — Encounter (HOSPITAL_COMMUNITY): Payer: Self-pay | Admitting: Anesthesiology

## 2012-02-16 ENCOUNTER — Encounter (HOSPITAL_COMMUNITY)
Admission: RE | Disposition: A | Discharge status: Home Health | Payer: Self-pay | Source: Ambulatory Visit | Attending: Orthopedic Surgery

## 2012-02-16 ENCOUNTER — Inpatient Hospital Stay (HOSPITAL_COMMUNITY): Payer: BC Managed Care – PPO

## 2012-02-16 ENCOUNTER — Encounter (HOSPITAL_COMMUNITY): Payer: Self-pay | Admitting: *Deleted

## 2012-02-16 ENCOUNTER — Inpatient Hospital Stay (HOSPITAL_COMMUNITY)
Admission: RE | Admit: 2012-02-16 | Discharge: 2012-02-19 | DRG: 209 | Disposition: A | Discharge status: Home Health | Payer: BC Managed Care – PPO | Source: Ambulatory Visit | Attending: Orthopedic Surgery | Admitting: Orthopedic Surgery

## 2012-02-16 DIAGNOSIS — Z01818 Encounter for other preprocedural examination: Secondary | ICD-10-CM

## 2012-02-16 DIAGNOSIS — IMO0002 Reserved for concepts with insufficient information to code with codable children: Secondary | ICD-10-CM | POA: Diagnosis present

## 2012-02-16 DIAGNOSIS — Z7982 Long term (current) use of aspirin: Secondary | ICD-10-CM

## 2012-02-16 DIAGNOSIS — Z01812 Encounter for preprocedural laboratory examination: Secondary | ICD-10-CM

## 2012-02-16 DIAGNOSIS — Z96659 Presence of unspecified artificial knee joint: Secondary | ICD-10-CM

## 2012-02-16 DIAGNOSIS — M1710 Unilateral primary osteoarthritis, unspecified knee: Principal | ICD-10-CM | POA: Diagnosis present

## 2012-02-16 DIAGNOSIS — Z01810 Encounter for preprocedural cardiovascular examination: Secondary | ICD-10-CM

## 2012-02-16 DIAGNOSIS — I10 Essential (primary) hypertension: Secondary | ICD-10-CM | POA: Diagnosis present

## 2012-02-16 DIAGNOSIS — Z87891 Personal history of nicotine dependence: Secondary | ICD-10-CM

## 2012-02-16 DIAGNOSIS — Z79899 Other long term (current) drug therapy: Secondary | ICD-10-CM

## 2012-02-16 DIAGNOSIS — M1990 Unspecified osteoarthritis, unspecified site: Secondary | ICD-10-CM | POA: Diagnosis present

## 2012-02-16 DIAGNOSIS — E785 Hyperlipidemia, unspecified: Secondary | ICD-10-CM | POA: Diagnosis present

## 2012-02-16 HISTORY — PX: TOTAL KNEE ARTHROPLASTY: SHX125

## 2012-02-16 SURGERY — ARTHROPLASTY, KNEE, TOTAL
Anesthesia: General | Site: Knee | Laterality: Left | Wound class: Clean

## 2012-02-16 MED ORDER — ONDANSETRON HCL 4 MG PO TABS
4.0000 mg | ORAL_TABLET | Freq: Four times a day (QID) | ORAL | Status: DC | PRN
  Administered 2012-02-17 – 2012-02-19 (×6): 4 mg via ORAL
  Filled 2012-02-16 (×6): qty 1

## 2012-02-16 MED ORDER — ACETAMINOPHEN 10 MG/ML IV SOLN
INTRAVENOUS | Status: AC
  Filled 2012-02-16: qty 100

## 2012-02-16 MED ORDER — METOPROLOL SUCCINATE ER 25 MG PO TB24
25.0000 mg | ORAL_TABLET | Freq: Every day | ORAL | Status: DC
Start: 2012-02-17 — End: 2012-02-19
  Administered 2012-02-17 – 2012-02-19 (×3): 25 mg via ORAL
  Filled 2012-02-16 (×3): qty 1

## 2012-02-16 MED ORDER — SODIUM CHLORIDE 0.9 % IR SOLN
Status: DC | PRN
  Administered 2012-02-16: 3000 mL

## 2012-02-16 MED ORDER — HYDROMORPHONE HCL PF 2 MG/ML IJ SOLN: 2.0000 mg | INTRAMUSCULAR | Status: DC | PRN

## 2012-02-16 MED ORDER — LIDOCAINE HCL (CARDIAC) 20 MG/ML IV SOLN
INTRAVENOUS | Status: DC | PRN
  Administered 2012-02-16: 40 mg via INTRAVENOUS

## 2012-02-16 MED ORDER — OXYCODONE HCL 5 MG/5ML PO SOLN: 5.0000 mg | Freq: Once | ORAL | Status: DC | PRN

## 2012-02-16 MED ORDER — SODIUM CHLORIDE 0.9 % IV SOLN
INTRAVENOUS | Status: DC
  Administered 2012-02-16: 22:00:00 via INTRAVENOUS

## 2012-02-16 MED ORDER — ACETAMINOPHEN 10 MG/ML IV SOLN
1000.0000 mg | Freq: Once | INTRAVENOUS | Status: AC
  Administered 2012-02-16: 1000 mg via INTRAVENOUS
  Filled 2012-02-16: qty 100

## 2012-02-16 MED ORDER — METOCLOPRAMIDE HCL 5 MG/ML IJ SOLN: 5.0000 mg | Freq: Three times a day (TID) | INTRAMUSCULAR | Status: DC | PRN

## 2012-02-16 MED ORDER — ONDANSETRON HCL 4 MG/2ML IJ SOLN
4.0000 mg | Freq: Once | INTRAMUSCULAR | Status: DC | PRN
Start: 2012-02-16 — End: 2012-02-16

## 2012-02-16 MED ORDER — METHOCARBAMOL 100 MG/ML IJ SOLN
500.0000 mg | Freq: Four times a day (QID) | INTRAVENOUS | Status: DC | PRN
  Filled 2012-02-16: qty 5

## 2012-02-16 MED ORDER — WARFARIN - PHARMACIST DOSING INPATIENT: Freq: Every day | Status: DC

## 2012-02-16 MED ORDER — ASPIRIN EC 81 MG PO TBEC
81.0000 mg | DELAYED_RELEASE_TABLET | Freq: Every day | ORAL | Status: DC
  Administered 2012-02-16 – 2012-02-19 (×4): 81 mg via ORAL
  Filled 2012-02-16 (×4): qty 1

## 2012-02-16 MED ORDER — WARFARIN SODIUM 5 MG PO TABS: 5.0000 mg | ORAL_TABLET | Freq: Every day | ORAL | Status: DC

## 2012-02-16 MED ORDER — ONDANSETRON HCL 4 MG/2ML IJ SOLN
INTRAMUSCULAR | Status: DC | PRN
  Administered 2012-02-16: 4 mg via INTRAVENOUS

## 2012-02-16 MED ORDER — PROPOFOL 10 MG/ML IV BOLUS
INTRAVENOUS | Status: DC | PRN
  Administered 2012-02-16: 50 mg via INTRAVENOUS
  Administered 2012-02-16: 150 mg via INTRAVENOUS

## 2012-02-16 MED ORDER — 0.9 % SODIUM CHLORIDE (POUR BTL) OPTIME
TOPICAL | Status: DC | PRN
  Administered 2012-02-16: 1000 mL

## 2012-02-16 MED ORDER — BUPIVACAINE-EPINEPHRINE PF 0.5-1:200000 % IJ SOLN
INTRAMUSCULAR | Status: DC | PRN
  Administered 2012-02-16: 30 mL

## 2012-02-16 MED ORDER — MEPERIDINE HCL 25 MG/ML IJ SOLN: 6.2500 mg | INTRAMUSCULAR | Status: DC | PRN

## 2012-02-16 MED ORDER — COUMADIN BOOK
Freq: Once | Status: DC
  Filled 2012-02-16: qty 1

## 2012-02-16 MED ORDER — ONDANSETRON HCL 4 MG/2ML IJ SOLN
4.0000 mg | Freq: Four times a day (QID) | INTRAMUSCULAR | Status: DC | PRN
  Administered 2012-02-16: 4 mg via INTRAVENOUS
  Filled 2012-02-16 (×2): qty 2

## 2012-02-16 MED ORDER — METHOCARBAMOL 500 MG PO TABS: 500.0000 mg | ORAL_TABLET | Freq: Three times a day (TID) | ORAL | Status: DC | PRN

## 2012-02-16 MED ORDER — AMLODIPINE BESYLATE 5 MG PO TABS
5.0000 mg | ORAL_TABLET | Freq: Every day | ORAL | Status: DC
  Administered 2012-02-16 – 2012-02-19 (×4): 5 mg via ORAL
  Filled 2012-02-16 (×4): qty 1

## 2012-02-16 MED ORDER — HYDROMORPHONE HCL PF 1 MG/ML IJ SOLN
INTRAMUSCULAR | Status: AC
  Filled 2012-02-16: qty 1

## 2012-02-16 MED ORDER — FENTANYL CITRATE 0.05 MG/ML IJ SOLN
INTRAMUSCULAR | Status: DC | PRN
  Administered 2012-02-16 (×6): 50 ug via INTRAVENOUS
  Administered 2012-02-16 (×2): 100 ug via INTRAVENOUS

## 2012-02-16 MED ORDER — FERROUS SULFATE 325 (65 FE) MG PO TABS
325.0000 mg | ORAL_TABLET | Freq: Three times a day (TID) | ORAL | Status: DC
  Administered 2012-02-17 – 2012-02-19 (×7): 325 mg via ORAL
  Filled 2012-02-16 (×11): qty 1

## 2012-02-16 MED ORDER — CEFAZOLIN SODIUM-DEXTROSE 2-3 GM-% IV SOLR
2.0000 g | Freq: Four times a day (QID) | INTRAVENOUS | Status: AC
  Administered 2012-02-16 (×2): 2 g via INTRAVENOUS
  Filled 2012-02-16 (×2): qty 50

## 2012-02-16 MED ORDER — CALCIUM CITRATE-VITAMIN D 315-200 MG-UNIT PO TABS: 1.0000 | ORAL_TABLET | Freq: Two times a day (BID) | ORAL | Status: DC

## 2012-02-16 MED ORDER — NIACIN 50 MG PO TABS
50.0000 mg | ORAL_TABLET | Freq: Every day | ORAL | Status: DC
  Filled 2012-02-16: qty 1

## 2012-02-16 MED ORDER — BENAZEPRIL HCL 10 MG PO TABS
10.0000 mg | ORAL_TABLET | Freq: Every day | ORAL | Status: DC
  Administered 2012-02-16 – 2012-02-19 (×4): 10 mg via ORAL
  Filled 2012-02-16 (×4): qty 1

## 2012-02-16 MED ORDER — ACETAMINOPHEN 325 MG PO TABS
650.0000 mg | ORAL_TABLET | Freq: Four times a day (QID) | ORAL | Status: DC | PRN
  Administered 2012-02-17 – 2012-02-18 (×4): 650 mg via ORAL
  Filled 2012-02-16 (×4): qty 2

## 2012-02-16 MED ORDER — ROCURONIUM BROMIDE 100 MG/10ML IV SOLN
INTRAVENOUS | Status: DC | PRN
  Administered 2012-02-16: 50 mg via INTRAVENOUS

## 2012-02-16 MED ORDER — MIDAZOLAM HCL 5 MG/5ML IJ SOLN
INTRAMUSCULAR | Status: DC | PRN
  Administered 2012-02-16: 2 mg via INTRAVENOUS

## 2012-02-16 MED ORDER — HYDROMORPHONE HCL PF 1 MG/ML IJ SOLN
0.2500 mg | INTRAMUSCULAR | Status: DC | PRN
  Administered 2012-02-16 (×3): 0.5 mg via INTRAVENOUS

## 2012-02-16 MED ORDER — OXYCODONE-ACETAMINOPHEN 5-325 MG PO TABS: 1.0000 | ORAL_TABLET | ORAL | Status: DC | PRN

## 2012-02-16 MED ORDER — NEOSTIGMINE METHYLSULFATE 1 MG/ML IJ SOLN
INTRAMUSCULAR | Status: DC | PRN
  Administered 2012-02-16: 2 mg via INTRAVENOUS

## 2012-02-16 MED ORDER — AMLODIPINE BESY-BENAZEPRIL HCL 5-10 MG PO CAPS: 1.0000 | ORAL_CAPSULE | Freq: Every day | ORAL | Status: DC

## 2012-02-16 MED ORDER — METOCLOPRAMIDE HCL 10 MG PO TABS: 5.0000 mg | ORAL_TABLET | Freq: Three times a day (TID) | ORAL | Status: DC | PRN

## 2012-02-16 MED ORDER — ACETAMINOPHEN 650 MG RE SUPP: 650.0000 mg | Freq: Four times a day (QID) | RECTAL | Status: DC | PRN

## 2012-02-16 MED ORDER — CALCIUM CARBONATE-VITAMIN D 500-200 MG-UNIT PO TABS
1.0000 | ORAL_TABLET | Freq: Two times a day (BID) | ORAL | Status: DC
  Administered 2012-02-16 – 2012-02-19 (×6): 1 via ORAL
  Filled 2012-02-16 (×8): qty 1

## 2012-02-16 MED ORDER — WARFARIN VIDEO: Freq: Once | Status: DC

## 2012-02-16 MED ORDER — MENTHOL 3 MG MT LOZG: 1.0000 | LOZENGE | OROMUCOSAL | Status: DC | PRN

## 2012-02-16 MED ORDER — GLYCOPYRROLATE 0.2 MG/ML IJ SOLN
INTRAMUSCULAR | Status: DC | PRN
  Administered 2012-02-16: 0.3 mg via INTRAVENOUS

## 2012-02-16 MED ORDER — LACTATED RINGERS IV SOLN
INTRAVENOUS | Status: DC | PRN
  Administered 2012-02-16 (×2): via INTRAVENOUS

## 2012-02-16 MED ORDER — OXYCODONE HCL 5 MG PO TABS
5.0000 mg | ORAL_TABLET | ORAL | Status: DC | PRN
  Administered 2012-02-17 – 2012-02-18 (×4): 10 mg via ORAL
  Administered 2012-02-18 (×2): 5 mg via ORAL
  Administered 2012-02-19 (×2): 10 mg via ORAL
  Filled 2012-02-16 (×6): qty 2
  Filled 2012-02-16 (×2): qty 1

## 2012-02-16 MED ORDER — PHENOL 1.4 % MT LIQD: 1.0000 | OROMUCOSAL | Status: DC | PRN

## 2012-02-16 MED ORDER — WARFARIN SODIUM 7.5 MG PO TABS
7.5000 mg | ORAL_TABLET | Freq: Once | ORAL | Status: AC
  Administered 2012-02-16: 7.5 mg via ORAL
  Filled 2012-02-16: qty 1

## 2012-02-16 MED ORDER — OXYCODONE HCL 5 MG PO TABS: 5.0000 mg | ORAL_TABLET | Freq: Once | ORAL | Status: DC | PRN

## 2012-02-16 MED ORDER — METHOCARBAMOL 500 MG PO TABS
500.0000 mg | ORAL_TABLET | Freq: Four times a day (QID) | ORAL | Status: DC | PRN
  Administered 2012-02-17: 500 mg via ORAL
  Filled 2012-02-16: qty 1

## 2012-02-16 SURGICAL SUPPLY — 60 items
BANDAGE ELASTIC 4 VELCRO ST LF (GAUZE/BANDAGES/DRESSINGS) IMPLANT
BANDAGE ELASTIC 6 VELCRO ST LF (GAUZE/BANDAGES/DRESSINGS) ×2 IMPLANT
BANDAGE ESMARK 6X9 LF (GAUZE/BANDAGES/DRESSINGS) ×1 IMPLANT
BANDAGE GAUZE ELAST BULKY 4 IN (GAUZE/BANDAGES/DRESSINGS) ×4 IMPLANT
BLADE SAG 18X100X1.27 (BLADE) ×2 IMPLANT
BLADE SAW SGTL 13.0X1.19X90.0M (BLADE) ×2 IMPLANT
BNDG CMPR 9X6 STRL LF SNTH (GAUZE/BANDAGES/DRESSINGS) ×1
BNDG CMPR MED 10X6 ELC LF (GAUZE/BANDAGES/DRESSINGS) ×1
BNDG ELASTIC 6X10 VLCR STRL LF (GAUZE/BANDAGES/DRESSINGS) ×2 IMPLANT
BNDG ESMARK 6X9 LF (GAUZE/BANDAGES/DRESSINGS) ×2
BOWL SMART MIX CTS (DISPOSABLE) ×2 IMPLANT
CEMENT HV SMART SET (Cement) ×4 IMPLANT
CLOTH BEACON ORANGE TIMEOUT ST (SAFETY) ×2 IMPLANT
CLSR STERI-STRIP ANTIMIC 1/2X4 (GAUZE/BANDAGES/DRESSINGS) ×2 IMPLANT
COVER BACK TABLE 24X17X13 BIG (DRAPES) IMPLANT
COVER SURGICAL LIGHT HANDLE (MISCELLANEOUS) ×2 IMPLANT
CUFF TOURNIQUET SINGLE 34IN LL (TOURNIQUET CUFF) ×2 IMPLANT
CUFF TOURNIQUET SINGLE 44IN (TOURNIQUET CUFF) IMPLANT
DRAPE EXTREMITY T 121X128X90 (DRAPE) ×2 IMPLANT
DRAPE PROXIMA HALF (DRAPES) ×2 IMPLANT
DRAPE U-SHAPE 47X51 STRL (DRAPES) ×2 IMPLANT
DRSG ADAPTIC 3X8 NADH LF (GAUZE/BANDAGES/DRESSINGS) ×2 IMPLANT
DRSG PAD ABDOMINAL 8X10 ST (GAUZE/BANDAGES/DRESSINGS) ×2 IMPLANT
DURAPREP 26ML APPLICATOR (WOUND CARE) ×2 IMPLANT
ELECT CAUTERY BLADE 6.4 (BLADE) ×2 IMPLANT
ELECT REM PT RETURN 9FT ADLT (ELECTROSURGICAL) ×2
ELECTRODE REM PT RTRN 9FT ADLT (ELECTROSURGICAL) ×1 IMPLANT
FACESHIELD LNG OPTICON STERILE (SAFETY) ×6 IMPLANT
GLOVE BIOGEL PI ORTHO PRO 7.5 (GLOVE) ×1
GLOVE BIOGEL PI ORTHO PRO SZ8 (GLOVE) ×1
GLOVE ORTHO TXT STRL SZ7.5 (GLOVE) ×2 IMPLANT
GLOVE PI ORTHO PRO STRL 7.5 (GLOVE) ×1 IMPLANT
GLOVE PI ORTHO PRO STRL SZ8 (GLOVE) ×1 IMPLANT
GLOVE SURG ORTHO 8.5 STRL (GLOVE) ×2 IMPLANT
GOWN STRL NON-REIN LRG LVL3 (GOWN DISPOSABLE) ×2 IMPLANT
GOWN STRL REIN XL XLG (GOWN DISPOSABLE) ×8 IMPLANT
HANDPIECE INTERPULSE COAX TIP (DISPOSABLE) ×2
IMMOBILIZER KNEE 22 UNIV (SOFTGOODS) IMPLANT
KIT BASIN OR (CUSTOM PROCEDURE TRAY) ×2 IMPLANT
KIT MANIFOLD (MISCELLANEOUS) ×2 IMPLANT
KIT ROOM TURNOVER OR (KITS) ×2 IMPLANT
MANIFOLD NEPTUNE II (INSTRUMENTS) ×2 IMPLANT
NS IRRIG 1000ML POUR BTL (IV SOLUTION) ×2 IMPLANT
PACK TOTAL JOINT (CUSTOM PROCEDURE TRAY) ×2 IMPLANT
PAD ARMBOARD 7.5X6 YLW CONV (MISCELLANEOUS) ×4 IMPLANT
SET HNDPC FAN SPRY TIP SCT (DISPOSABLE) ×1 IMPLANT
SPONGE GAUZE 4X4 12PLY (GAUZE/BANDAGES/DRESSINGS) ×2 IMPLANT
STRIP CLOSURE SKIN 1/2X4 (GAUZE/BANDAGES/DRESSINGS) ×4 IMPLANT
SUCTION FRAZIER TIP 10 FR DISP (SUCTIONS) ×2 IMPLANT
SUT MNCRL AB 3-0 PS2 18 (SUTURE) ×2 IMPLANT
SUT VIC AB 0 CT1 27 (SUTURE) ×2
SUT VIC AB 0 CT1 27XBRD ANBCTR (SUTURE) ×1 IMPLANT
SUT VIC AB 1 CT1 27 (SUTURE) ×8
SUT VIC AB 1 CT1 27XBRD ANBCTR (SUTURE) ×4 IMPLANT
SUT VIC AB 2-0 CT1 27 (SUTURE) ×4
SUT VIC AB 2-0 CT1 TAPERPNT 27 (SUTURE) ×2 IMPLANT
TOWEL OR 17X24 6PK STRL BLUE (TOWEL DISPOSABLE) ×2 IMPLANT
TOWEL OR 17X26 10 PK STRL BLUE (TOWEL DISPOSABLE) ×2 IMPLANT
TRAY FOLEY CATH 14FR (SET/KITS/TRAYS/PACK) ×2 IMPLANT
WATER STERILE IRR 1000ML POUR (IV SOLUTION) ×8 IMPLANT

## 2012-02-16 NOTE — Anesthesia Preprocedure Evaluation (Addendum)
Anesthesia Evaluation  Patient identified by MRN, date of birth, ID band Patient awake    Reviewed: Allergy & Precautions, H&P , NPO status , Patient's Chart, lab work & pertinent test results, reviewed documented beta blocker date and time   Airway Mallampati: I TM Distance: >3 FB Neck ROM: Full    Dental  (+) Teeth Intact   Pulmonary  breath sounds clear to auscultation        Cardiovascular hypertension, Pt. on medications and Pt. on home beta blockers + Past MI     Neuro/Psych    GI/Hepatic   Endo/Other    Renal/GU      Musculoskeletal   Abdominal   Peds  Hematology   Anesthesia Other Findings   Reproductive/Obstetrics                         Anesthesia Physical Anesthesia Plan  ASA: II  Anesthesia Plan: General   Post-op Pain Management:    Induction: Intravenous  Airway Management Planned: Oral ETT  Additional Equipment:   Intra-op Plan:   Post-operative Plan: Extubation in OR  Informed Consent: I have reviewed the patients History and Physical, chart, labs and discussed the procedure including the risks, benefits and alternatives for the proposed anesthesia with the patient or authorized representative who has indicated his/her understanding and acceptance.   Dental advisory given  Plan Discussed with: CRNA and Surgeon  Anesthesia Plan Comments:        Anesthesia Quick Evaluation

## 2012-02-16 NOTE — Transfer of Care (Signed)
Immediate Anesthesia Transfer of Care Note  Patient: [name].  Procedure(s) Performed: Procedure(s) (LRB) with comments: TOTAL KNEE ARTHROPLASTY (Left)  Patient Location: PACU  Anesthesia Type:General  Level of Consciousness: awake, alert  and oriented  Airway & Oxygen Therapy: Patient Spontanous Breathing and Patient connected to nasal cannula oxygen  Post-op Assessment: Report given to PACU RN, Post -op Vital signs reviewed and stable and Patient moving all extremities X 4  Post vital signs: Reviewed and stable  Complications: No apparent anesthesia complications

## 2012-02-16 NOTE — Progress Notes (Signed)
cpm removed for xray

## 2012-02-16 NOTE — Anesthesia Procedure Notes (Addendum)
Anesthesia Regional Block:  Femoral nerve block  Pre-Anesthetic Checklist: ,, timeout performed, Correct Patient, Correct Site, Correct Laterality, Correct Procedure, Correct Position, site marked, Risks and benefits discussed,  Surgical consent,  Pre-op evaluation,  At surgeon's request and post-op pain management  Laterality: Left  Prep: chloraprep       Needles:  Injection technique: Single-shot  Needle Type: Echogenic Stimulator Needle          Additional Needles:  Procedures: ultrasound guided (picture in chart) and nerve stimulator Femoral nerve block  Nerve Stimulator or Paresthesia:  Response: 0.4 mA,   Additional Responses:   Narrative:  Start time: 02/16/2012 9:10 AM End time: 02/16/2012 9:22 AM Injection made incrementally with aspirations every 5 mL.  Performed by: Personally  Anesthesiologist: Arta Bruce MD  Additional Notes: Monitors applied. Patient sedated. Sterile prep and drape,hand hygiene and sterile gloves were used. Relevant anatomy identified.Needle position confirmed.Local anesthetic injected incrementally after negative aspiration. Local anesthetic spread visualized around nerve(s). Vascular puncture avoided. No complications. Image printed for medical record.The patient tolerated the procedure well.       Femoral nerve block Procedure Name: Intubation Date/Time: 02/16/2012 9:37 AM Performed by: Sharlene Dory E Pre-anesthesia Checklist: Patient identified, Emergency Drugs available, Suction available, Patient being monitored and Timeout performed Patient Re-evaluated:Patient Re-evaluated prior to inductionOxygen Delivery Method: Circle system utilized Preoxygenation: Pre-oxygenation with 100% oxygen Intubation Type: IV induction Ventilation: Mask ventilation without difficulty and Oral airway inserted - appropriate to patient size Laryngoscope Size: Mac and 3 Grade View: Grade II Tube type: Oral Tube size: 7.5 mm Number of attempts:  1 Airway Equipment and Method: Stylet Placement Confirmation: ETT inserted through vocal cords under direct vision,  positive ETCO2 and breath sounds checked- equal and bilateral Secured at: 21 cm Tube secured with: Tape Dental Injury: Teeth and Oropharynx as per pre-operative assessment

## 2012-02-16 NOTE — Progress Notes (Signed)
Utilization review completed. Katiejo Gilroy, RN, BSN. 

## 2012-02-16 NOTE — Interval H&P Note (Signed)
History and Physical Interval Note:  02/16/2012 9:22 AM  [name].  has presented today for surgery, with the diagnosis of Left Knee Osteoarthritis  The various methods of treatment have been discussed with the patient and family. After consideration of risks, benefits and other options for treatment, the patient has consented to  Procedure(s) (LRB) with comments: TOTAL KNEE ARTHROPLASTY (Left) as a surgical intervention .  The patient's history has been reviewed, patient examined, no change in status, stable for surgery.  I have reviewed the patient's chart and labs.  Questions were answered to the patient's satisfaction.     Maximillian Habibi,STEVEN R

## 2012-02-16 NOTE — Progress Notes (Signed)
Orthopedic Tech Progress Note Patient Details:  [name]. 11/09/1960 [id number] CPM applied to Left LE with appropriate settings then taken off because xray had not been performed yet. PACU nurse to reapply CPM after xray. Settings already adjusted; nurse to put patient in CPM and start machine. OHF applied to bed.  CPM Left Knee CPM Left Knee: On Left Knee Flexion (Degrees): 60  Left Knee Extension (Degrees): 0    Asia R Thompson 02/16/2012, 12:53 PM

## 2012-02-16 NOTE — Anesthesia Postprocedure Evaluation (Signed)
Anesthesia Post Note  Patient: [name].  Procedure(s) Performed: Procedure(s) (LRB): TOTAL KNEE ARTHROPLASTY (Left)  Anesthesia type: general  Patient location: PACU  Post pain: Pain level controlled  Post assessment: Patient's Cardiovascular Status Stable  Last Vitals:  Filed Vitals:   02/16/12 1350  BP:   Pulse: 65  Temp:   Resp: 19    Post vital signs: Reviewed and stable  Level of consciousness: sedated  Complications: No apparent anesthesia complications

## 2012-02-16 NOTE — Preoperative (Signed)
Beta Blockers   Reason not to administer Beta Blockers:Not Applicable 

## 2012-02-16 NOTE — Brief Op Note (Signed)
02/16/2012  11:50 AM  PATIENT:  Olene Craven.  52 y.o. male  PRE-OPERATIVE DIAGNOSIS:  Left Knee Osteoarthritis, end staged  POST-OPERATIVE DIAGNOSIS:  Left Knee Osteoarthritis, end staged  PROCEDURE:  Procedure(s) (LRB) with comments: TOTAL KNEE ARTHROPLASTY (Left), DePuy Sigma RP  SURGEON:  Surgeon(s) and Role:    * Verlee Rossetti, MD - Primary  PHYSICIAN ASSISTANT:   ASSISTANTS: Thea Gist, PA-C   ANESTHESIA:   local and general  EBL:  Total I/O In: 1000 [I.V.:1000] Out: 800 [Urine:800]  BLOOD ADMINISTERED:none  DRAINS: none   LOCAL MEDICATIONS USED:  NONE  SPECIMEN:  No Specimen  DISPOSITION OF SPECIMEN:  N/A  COUNTS:  YES  TOURNIQUET:  * Missing tourniquet times found for documented tourniquets in log:  16109 *  DICTATION: .Other Dictation: Dictation Number [id number]  PLAN OF CARE: Admit to inpatient   PATIENT DISPOSITION:  PACU - hemodynamically stable.   Delay start of Pharmacological VTE agent (>24hrs) due to surgical blood loss or risk of bleeding: not applicable

## 2012-02-16 NOTE — Op Note (Signed)
[name], [name] NO.:  [id number]  MEDICAL RECORD NO.:  [id number]  LOCATION:  [facility]                         FACILITY:  [facility]  PHYSICIAN:  Almedia Balls. [name], M.D. DATE OF BIRTH:  October 24, 1960  DATE OF PROCEDURE:  02/16/2012 DATE OF DISCHARGE:                              OPERATIVE REPORT   PREOPERATIVE DIAGNOSIS:  Left knee end-stage osteoarthritis.  POSTOPERATIVE DIAGNOSIS:  Left knee end-stage osteoarthritis.  PROCEDURE PERFORMED:  Left knee total knee replacement using DePuy Sigma rotating platform prosthesis.  ATTENDING SURGEON:  Almedia Balls. [name], MD  ASSISTANT:  Donnie Coffin. Dixon, PA-C, who scrubbed the entire procedure necessary for satisfactory completion for surgery.  ANESTHESIA:  General anesthesia was used plus femoral block.  ESTIMATED BLOOD LOSS:  Minimal.  FLUID REPLACED:  1500 mL crystalloid.  INSTRUMENT COUNTS:  Correct.  COMPLICATIONS:  No complications.  TOURNIQUET:  300 mmHg for 1 hour and 15 minutes.  INDICATIONS:  The patient is a 52 year old male with history of worsening left knee pain secondary to end-stage arthritis.  The patient has had progressive pain despite conservative management including injections, modification of activity, anti-inflammatory medications. This is interfering with his ADLs.  The patient does have to sit down after block walking due to pain in his knee.  The patient presents for surgical management, having been counseled regarding options, both surgical and nonsurgical, for continued treatment for his knee arthritis.  The patient has previously had a right total knee replacement, done very well with that and informed consent was obtained.  DESCRIPTION OF PROCEDURE:  After adequate level of anesthesia was achieved, the patient was positioned supine on the operating room table. Left leg correctly identified.  The patient had a nonsterile tourniquet placed on left proximal thigh.  Left leg sterilely  prepped and draped in usual manner.  Time-out was called.  We then elevated the leg, exsanguinated using Esmarch bandage, and elevated the tourniquet to 300 mmHg.  Longitudinal midline incision was created with the knee in flexion.  A medial parapatellar arthrotomy was developed.  Patient had prior quadriceps tendon repair which was intact.  There was some heterotopic ossification in the medial retinaculum which we had removed. We were able to release lateral patellofemoral ligaments and some scar tissue and evert the patella, tubercle was not in jeopardy in terms of the insertion of patellar tendon.  We removed excess spurs with some synovitis.  We went into the distal femur with step-cut drill.  We went ahead and placed intramedullary distal femoral resection guide set on 5 degrees left, 10 mm resection.  We resected that and sized the femur to size 4 anterior down, placed our formalin block in place and cut our anterior, posterior, and chamfer cuts.  We released ACL, PCL meniscal tissue, subluxed the tibia anteriorly.  We then cut the tibia perpendicular long axis, tibia 90 degrees with minimal posterior slope using external alignment jig.  We resected 4-mm off the affected medial side.  We then removed posterior osteophytes off the posterior femoral condyles using 3-1/4 inch curved osteotome with a lamina spreader, released also some posterior capsule.  We were able to get 10 mm spacer blocks in both  in flexion extension with nice balance.  We then continued resurfacing the tibia.  We did our modular drilling keel punch to complete the tibial preparation bleeding the tibial trial in place, and then we went ahead and did our box cut for femoral posterior cruciate substituting prosthesis, diffuse Sigma rotating platform.  Once we completed that resection, we impacted our size 4 left femur in place. We placed 10 spacer.  We went ahead and placed the knee in full extension.  The patella  was 29 mm in thickness.  We went and resected down to 20 using a patellar resection guide, drilled our lugs for 38 patellar button was replaced and we took the knee through a full range of motion high stability and normal patellar tracking.  No-touch technique.  We then removed all trial components, pulse irrigated the knee and then cemented the components into place using DePuy high viscosity cement with vacuum mixing.  Once the cement was in place, we went and reduced the knee with a size 10 polyethylene insert, placed the knee in extension, cemented the polyethylene patellar component, and then clamped that with a patellar clamp.  Once the cement was allowed to harden, we removed excess cement with quarter-inch curved osteotome.  We were pleased with alignment, we thought we did a 12.5 in which we did try the 12.5, we are happy with that, we could get the full extension with nice stability mid flexion, removed the trial components, thoroughly irrigated the knee, inspecting the posterior aspect of the knee for any excess cement or bone.  We then placed our 12.5 polyethylene RP component in place, reduced the knee, we are happy again with soft tissue alignment balancing and patellar tracking.  We then repaired the patellar retinaculum with interrupted #1 Vicryl suture, followed by 0 and 2-0 Vicryl subcutaneous closure and 4-0 Monocryl for skin.  Steri-Strips applied followed by sterile dressing.  The patient tolerated the surgery well.     Almedia Balls. [name], M.D.     SRN/MEDQ  D:  02/16/2012  T:  02/16/2012  Job:  [id number]

## 2012-02-16 NOTE — Progress Notes (Signed)
cpm restarted

## 2012-02-16 NOTE — Progress Notes (Signed)
ANTICOAGULATION CONSULT NOTE - Initial Consult  Pharmacy Consult for Coumadin Indication: VTE prophylaxis  No Known Allergies  Patient Measurements:   Height = 72 inches Weight = 96.3 kg  Vital Signs: Temp: 97.4 F (36.3 C) (01/31 1345) Temp src: Oral (01/31 0759) BP: 138/72 mmHg (01/31 1339) Pulse Rate: 65  (01/31 1350)  Labs:  From 02/08/12 K 4.3, SCr 0.86 H/H/PLT 16.3/46.5/217   The CrCl is unknown because both a height and weight (above a minimum accepted value) are required for this calculation.   Medical History: Past Medical History  Diagnosis Date  . Hypertension     on meds for 8 years  . Hyperlipemia     Medications:  Prescriptions prior to admission  Medication Sig Dispense Refill  . amLODipine-benazepril (LOTREL) 5-10 MG per capsule Take 1 capsule by mouth daily.      Marland Kitchen aspirin EC 81 MG tablet Take 81 mg by mouth daily.      . calcium citrate-vitamin D (CITRACAL+D) 315-200 MG-UNIT per tablet Take 1 tablet by mouth 2 (two) times daily.      . metoprolol succinate (TOPROL-XL) 25 MG 24 hr tablet Take 25 mg by mouth daily.      . niacin 50 MG tablet Take 50 mg by mouth daily with breakfast.        Assessment: 52 yo M admitted 02/16/2012 for left TKA.  Pt was on Coumadin in 2010 with R TKA.  Doses and corresponding INRs below.  12/28 INR 0.9 12/30   Coumadin 10 mg x 1 12/31 INR 1  Coumadin 10 mg x 1 01/01 INR 2.1  No Coumadin 01/02 INR 1.7  Coumadin 5 mg x 1  Based on previous dosing regimen and INR response, will give patient reduced Coumadin dose tonight.  Goal of Therapy:  INR 2-3   Plan:  Coumadin 7.5 mg PO x 1 tonight Daily INR. Coumadin book and video.  [employer], Pharm.D., BCPS Clinical Pharmacist Pager [phone] 02/16/2012 4:35 PM

## 2012-02-17 LAB — CBC
HCT: 41.5 % (ref 39.0–52.0)
Hemoglobin: 14 g/dL (ref 13.0–17.0)
MCH: 30.6 pg (ref 26.0–34.0)
MCHC: 33.7 g/dL (ref 30.0–36.0)
MCV: 90.8 fL (ref 78.0–100.0)
Platelets: 173 10*3/uL (ref 150–400)
RBC: 4.57 MIL/uL (ref 4.22–5.81)
RDW: 13 % (ref 11.5–15.5)
WBC: 13.2 10*3/uL — ABNORMAL HIGH (ref 4.0–10.5)

## 2012-02-17 LAB — BASIC METABOLIC PANEL
BUN: 8 mg/dL (ref 6–23)
CO2: 28 mEq/L (ref 19–32)
Calcium: 8.6 mg/dL (ref 8.4–10.5)
Chloride: 103 mEq/L (ref 96–112)
Creatinine, Ser: 0.7 mg/dL (ref 0.50–1.35)
GFR calc Af Amer: >90 mL/min (ref >90)
GFR calc non Af Amer: >90 mL/min (ref >90)
Glucose, Bld: 112 mg/dL — ABNORMAL HIGH (ref 70–99)
Potassium: 4.1 mEq/L (ref 3.5–5.1)
Sodium: 138 mEq/L (ref 135–145)

## 2012-02-17 LAB — PROTIME-INR
INR: 1.01 (ref 0.00–1.49)
Prothrombin Time: 13.2 seconds (ref 11.6–15.2)

## 2012-02-17 MED ORDER — WARFARIN SODIUM 7.5 MG PO TABS
7.5000 mg | ORAL_TABLET | Freq: Once | ORAL | Status: AC
  Administered 2012-02-17: 7.5 mg via ORAL
  Filled 2012-02-17: qty 1

## 2012-02-17 MED ORDER — ENOXAPARIN SODIUM 40 MG/0.4ML ~~LOC~~ SOLN
40.0000 mg | SUBCUTANEOUS | Status: DC
  Administered 2012-02-17 – 2012-02-18 (×2): 40 mg via SUBCUTANEOUS
  Filled 2012-02-17 (×3): qty 0.4

## 2012-02-17 NOTE — Progress Notes (Signed)
Subjective: 1 Day Post-Op Procedure(s) (LRB): TOTAL KNEE ARTHROPLASTY (Left) Patient reports pain as moderate.   Reports some nausea this AM when getting up with PT. Doing well now no complaints otherwise. Objective: Vital signs in last 24 hours: Temp:  [97 F (36.1 C)-98.1 F (36.7 C)] 98.1 F (36.7 C) (02/01 0533) Pulse Rate:  [49-80] 57  (02/01 0837) Resp:  [12-20] 18  (02/01 0800) BP: (80-141)/(45-77) 124/65 mmHg (02/01 0837) SpO2:  [96 %-99 %] 99 % (02/01 0800)  Intake/Output from previous day: 01/31 0701 - 02/01 0700 In: 1879.5 [I.V.:1877.5; IV Piggyback:2] Out: 4075 [Urine:4025; Blood:50] Intake/Output this shift:     Basename 02/17/12 0615  HGB 14.0    Basename 02/17/12 0615  WBC 13.2*  RBC 4.57  HCT 41.5  PLT 173    Basename 02/17/12 0615  NA 138  K 4.1  CL 103  CO2 28  BUN 8  CREATININE 0.70  GLUCOSE 112*  CALCIUM 8.6    Basename 02/17/12 0615  LABPT --  INR 1.01    General: awake and alert no acute distress Left lower leg: Dorsal pedal pulse present, calf supple non tender, Sensation intact to light touch. Dressing clean dry and intact.  Assessment/Plan: 1 Day Post-Op Procedure(s) (LRB): TOTAL KNEE ARTHROPLASTY (Left) Up with therapy On coumadin for DVT prophylaxis  Jevin Camino 02/17/2012, 10:54 AM

## 2012-02-17 NOTE — Progress Notes (Signed)
Physical Therapy Treatment Patient Details Name: [name]. MRN: [id number] DOB: 07-08-1960 Today's Date: 02/17/2012 Time: 3086-5784 PT Time Calculation (min): 29 min  PT Assessment / Plan / Recommendation Comments on Treatment Session  Pt able to tolerate therapy with no c/o nausea or dizziness.  Doing well with mobility    Follow Up Recommendations  Home health PT;Supervision - Intermittent     Does the patient have the potential to tolerate intense rehabilitation     Barriers to Discharge        Equipment Recommendations  None recommended by PT    Recommendations for Other Services    Frequency 7X/week   Plan Discharge plan remains appropriate;Frequency remains appropriate    Precautions / Restrictions Precautions Precautions: Knee Required Braces or Orthoses: Knee Immobilizer - Left Knee Immobilizer - Left: On except when in CPM Restrictions Weight Bearing Restrictions: Yes LLE Weight Bearing: Weight bearing as tolerated   Pertinent Vitals/Pain 2-3/10 pain in knee. Pt medicated prior to session.     Mobility  Bed Mobility Bed Mobility: Supine to Sit;Sit to Supine Supine to Sit: 6: Modified independent (Device/Increase time);HOB elevated Transfers Transfers: Sit to Stand;Stand to Sit Sit to Stand: 5: Supervision;From bed;With upper extremity assist Stand to Sit: 5: Supervision;To chair/3-in-1;With upper extremity assist Details for Transfer Assistance: vc for technique. Ambulation/Gait Ambulation/Gait Assistance: 5: Supervision Ambulation Distance (Feet): 100 Feet Assistive device: Rolling walker Ambulation/Gait Assistance Details: vc for gait sequencing.  Gait Pattern: Step-to pattern Stairs: No Wheelchair Mobility Wheelchair Mobility: No    Exercises Total Joint Exercises Ankle Circles/Pumps: Both;10 reps Quad Sets: Left;5 reps   PT Diagnosis:    PT Problem List:   PT Treatment Interventions:     PT Goals Acute Rehab PT Goals PT Goal  Formulation: With patient Time For Goal Achievement: 02/24/12 Potential to Achieve Goals: Good Pt will go Sit to Stand: with modified independence PT Goal: Sit to Stand - Progress: Progressing toward goal Pt will go Stand to Sit: with modified independence Pt will Ambulate: 51 - 150 feet;with modified independence;with rolling walker PT Goal: Ambulate - Progress: Progressing toward goal Pt will Go Up / Down Stairs: 3-5 stairs;with supervision;with least restrictive assistive device Pt will Perform Home Exercise Program: Independently PT Goal: Perform Home Exercise Program - Progress: Progressing toward goal  Visit Information  Last PT Received On: 02/17/12 Assistance Needed: +1    Subjective Data  Subjective: i feel better   Cognition  Overall Cognitive Status: Appears within functional limits for tasks assessed/performed Arousal/Alertness: Awake/alert Orientation Level: Appears intact for tasks assessed Behavior During Session: [facility] for tasks performed    Balance     End of Session PT - End of Session Equipment Utilized During Treatment: Gait belt;Left knee immobilizer Activity Tolerance: Patient tolerated treatment well Patient left: in chair;with call bell/phone within reach Nurse Communication: Mobility status CPM Left Knee CPM Left Knee: On Left Knee Flexion (Degrees): 60  Left Knee Extension (Degrees): 0    GP     [name] 02/17/2012, 4:29 PM [name] L. Eduardo Wurth DPT [phone]

## 2012-02-17 NOTE — Progress Notes (Signed)
Nurse assessed pt d/t feeling dizzy and nausea during pt.  VS wnl, no SOB, chest pains.  Pt stated "this happens to me everytime that I have surgery".  Will continue to monitor for status changes.

## 2012-02-17 NOTE — Evaluation (Signed)
Physical Therapy Evaluation Patient Details Name: [name]. MRN: [id number] DOB: 03-Feb-1960 Today's Date: 02/17/2012 Time: 4098-1191 PT Time Calculation (min): 26 min  PT Assessment / Plan / Recommendation Clinical Impression  Pt is a 52 y/o male s/p L TKA. Pt was unable to tolerate activity this am secondary to c/o nausea and dizziness in sitting.  Pt"s BP dropped to 80/45 in sitting with nausea and profuse sweating.  Returned pt to supine where symptoms quickly resolved.  Acute PT to follow pt to progress mobility and activity tolerance.      PT Assessment  Patient needs continued PT services    Follow Up Recommendations  Home health PT;Supervision - Intermittent    Does the patient have the potential to tolerate intense rehabilitation      Barriers to Discharge        Equipment Recommendations  None recommended by PT    Recommendations for Other Services     Frequency 7X/week    Precautions / Restrictions Precautions Precautions: Knee Required Braces or Orthoses: Knee Immobilizer - Left Knee Immobilizer - Left: On except when in CPM Restrictions Weight Bearing Restrictions: Yes LLE Weight Bearing: Weight bearing as tolerated    Pertinent Vitals/Pain 3/10 pain in knee. premedicated  02/17/12 0835 02/17/12 0837  Vital Signs  Pulse Rate ! 49  ! 57   BP ! 80/45 mmHg 124/65 mmHg  BP Location Right arm Right arm  BP Method Automatic Automatic  Patient Position, if appropriate Sitting Lying        Mobility  Bed Mobility Bed Mobility: Supine to Sit;Sit to Supine Supine to Sit: 5: Supervision Sit to Supine: 5: Supervision Transfers Transfers: Not assessed Ambulation/Gait Ambulation/Gait Assistance: Not tested (comment)    Shoulder Instructions     Exercises     PT Diagnosis: Generalized weakness;Difficulty walking;Acute pain  PT Problem List: Decreased strength;Decreased range of motion;Decreased activity tolerance;Decreased mobility;Decreased  knowledge of use of DME;Pain PT Treatment Interventions: Gait training;Stair training;DME instruction;Therapeutic activities;Therapeutic exercise;Patient/family education   PT Goals Acute Rehab PT Goals PT Goal Formulation: With patient Time For Goal Achievement: 02/24/12 Potential to Achieve Goals: Good Pt will go Sit to Stand: with modified independence PT Goal: Sit to Stand - Progress: Goal set today Pt will go Stand to Sit: with modified independence PT Goal: Stand to Sit - Progress: Goal set today Pt will Ambulate: 51 - 150 feet;with modified independence;with rolling walker PT Goal: Ambulate - Progress: Goal set today Pt will Go Up / Down Stairs: 3-5 stairs;with supervision;with least restrictive assistive device PT Goal: Up/Down Stairs - Progress: Goal set today Pt will Perform Home Exercise Program: Independently PT Goal: Perform Home Exercise Program - Progress: Goal set today  Visit Information  Last PT Received On: 02/17/12 Assistance Needed: +1    Subjective Data  Subjective: Agree to PT eval.     Prior Functioning  Home Living Lives With: Spouse Available Help at Discharge: Family;Available 24 hours/day Type of Home: House Home Access: Stairs to enter [employer] of Steps: 5 Entrance Stairs-Rails: Right;Left Home Layout: One level Bathroom Shower/Tub: Walk-in Contractor: Standard Bathroom Accessibility: Yes Home Adaptive Equipment: Built-in shower seat;Straight cane;Walker - rolling Prior Function Level of Independence: Independent Able to Take Stairs?: Yes Driving: Yes Vocation: Full time employment Communication Communication: No difficulties    Cognition  Overall Cognitive Status: Appears within functional limits for tasks assessed/performed Arousal/Alertness: Awake/alert Orientation Level: Appears intact for tasks assessed Behavior During Session: [facility] for tasks performed  Extremity/Trunk Assessment Right Upper  Extremity Assessment RUE ROM/Strength/Tone: Within functional levels Left Upper Extremity Assessment LUE ROM/Strength/Tone: Within functional levels Right Lower Extremity Assessment RLE ROM/Strength/Tone: WFL for tasks assessed Left Lower Extremity Assessment LLE ROM/Strength/Tone: Unable to fully assess   Balance    End of Session CPM Left Knee CPM Left Knee: Off  GP     [name] 02/17/2012, 8:46 AM Theron Arista L. Sharmayne Jablon DPT [phone]

## 2012-02-17 NOTE — Progress Notes (Signed)
ANTICOAGULATION CONSULT NOTE - Follow Up Consult  Pharmacy Consult for Coumadin Indication: VTE prophylaxis  Lovenox Dosing Weight: 96 kg  Vital Signs: BP 124/65  Pulse 57  Temp 98.1 F (36.7 C) (Oral)  Resp 18  Ht 6' 0.05" (1.83 m)  Wt 212 lb 4.9 oz (96.3 kg)  BMI 28.76 kg/m2  SpO2 99%  Labs:  Basename 02/17/12 0615  HGB 14.0  HCT 41.5  PLT 173  APTT --  LABPROT 13.2  INR 1.01  HEPARINUNFRC --  CREATININE 0.70  CKTOTAL --  CKMB --  TROPONINI --   Lab Results  Component Value Date   INR 1.01 02/17/2012    Estimated Creatinine Clearance: 130 ml/min (by C-G formula based on Cr of 0.7).  Assessment:  POD #1 following L-TKA and placed on Coumadin with Lovenox bridging for VTE prophylaxis   Goal of Therapy:  Target INR 2-3    Plan:  Coumadin 7.5 mg today   Lovenox 40 mg sq daily Daily INR's, CBC.   [name], Deetta Perla.D 02/17/2012, 2:10 PM

## 2012-02-18 LAB — CBC
HCT: 37.3 % — ABNORMAL LOW (ref 39.0–52.0)
Hemoglobin: 12.7 g/dL — ABNORMAL LOW (ref 13.0–17.0)
MCH: 30.5 pg (ref 26.0–34.0)
MCHC: 34 g/dL (ref 30.0–36.0)
MCV: 89.7 fL (ref 78.0–100.0)
Platelets: 155 10*3/uL (ref 150–400)
RBC: 4.16 MIL/uL — ABNORMAL LOW (ref 4.22–5.81)
RDW: 12.8 % (ref 11.5–15.5)
WBC: 11.5 10*3/uL — ABNORMAL HIGH (ref 4.0–10.5)

## 2012-02-18 LAB — PROTIME-INR
INR: 1.37 (ref 0.00–1.49)
Prothrombin Time: 16.5 seconds — ABNORMAL HIGH (ref 11.6–15.2)

## 2012-02-18 MED ORDER — WARFARIN SODIUM 7.5 MG PO TABS
7.5000 mg | ORAL_TABLET | Freq: Once | ORAL | Status: AC
  Administered 2012-02-18: 7.5 mg via ORAL
  Filled 2012-02-18: qty 1

## 2012-02-18 NOTE — Progress Notes (Signed)
Orthopedics Progress Note  Subjective: I feel better today. Started walking lat evening  Objective:  Filed Vitals:   02/18/12 0728  BP: 122/74  Pulse: 83  Temp: 98.6 F (37 C)  Resp: 16    General: Awake and alert  Musculoskeletal: dressing CDI L knee, no cords, neg Homan's Neurovascularly intact  Lab Results  Component Value Date   WBC 11.5* 02/18/2012   HGB 12.7* 02/18/2012   HCT 37.3* 02/18/2012   MCV 89.7 02/18/2012   PLT 155 02/18/2012       Component Value Date/Time   NA 138 02/17/2012 0615   K 4.1 02/17/2012 0615   CL 103 02/17/2012 0615   CO2 28 02/17/2012 0615   GLUCOSE 112* 02/17/2012 0615   BUN 8 02/17/2012 0615   CREATININE 0.70 02/17/2012 0615   CALCIUM 8.6 02/17/2012 0615   GFRNONAA >90 02/17/2012 0615   GFRAA >90 02/17/2012 0615    Lab Results  Component Value Date   INR 1.37 02/18/2012   INR 1.01 02/17/2012   INR 1.7* 01/18/2008    Assessment/Plan: POD #2 s/p Procedure(s): TOTAL KNEE ARTHROPLASTY Doing very well. Still has not walked very far. Needs more PT Prop ankle and work on knee extension  [employer]. [name], MD 02/18/2012 8:32 AM

## 2012-02-18 NOTE — Progress Notes (Signed)
Physical Therapy Treatment Patient Details Name: [name]. MRN: [id number] DOB: 08/10/1960 Today's Date: 02/18/2012 Time: 4098-1191 PT Time Calculation (min): 42 min  PT Assessment / Plan / Recommendation Comments on Treatment Session  pt rpesents with L TKA.  pt moving great, only limited by nausea.  pt notes that when he had his R TKA he had nausea as well.  Feel with nausea controlled pt will make great progress.      Follow Up Recommendations  Home health PT;Supervision - Intermittent     Does the patient have the potential to tolerate intense rehabilitation     Barriers to Discharge        Equipment Recommendations  None recommended by PT    Recommendations for Other Services    Frequency 7X/week   Plan Discharge plan remains appropriate;Frequency remains appropriate    Precautions / Restrictions Precautions Precautions: Knee Precaution Comments: Per pt Dr [name] told him this am he no longer had to wear KI.   Restrictions Weight Bearing Restrictions: Yes LLE Weight Bearing: Weight bearing as tolerated   Pertinent Vitals/Pain Indicates pain is minimal compared to nausea.  RN made aware.      Mobility  Bed Mobility Bed Mobility: Supine to Sit;Sitting - Scoot to Edge of Bed Supine to Sit: 6: Modified independent (Device/Increase time);HOB flat Sitting - Scoot to Edge of Bed: 6: Modified independent (Device/Increase time) Details for Bed Mobility Assistance: cues for use of sheet or belt to A with movement of L LE at home.   Transfers Transfers: Sit to Stand;Stand to Sit Sit to Stand: 5: Supervision;With upper extremity assist;From bed;From chair/3-in-1;With armrests Stand to Sit: 5: Supervision;With upper extremity assist;To chair/3-in-1;With armrests Details for Transfer Assistance: demos good technique.   Ambulation/Gait Ambulation/Gait Assistance: 5: Supervision Ambulation Distance (Feet): 15 Feet (10', 8' and 8') Assistive device: Rolling  walker Ambulation/Gait Assistance Details: demos good use of RW, cues for safety with turns.   Gait Pattern: Step-to pattern;Decreased step length - right;Decreased stance time - left;Decreased stride length Stairs: Yes Stairs Assistance: 4: Min guard Stair Management Technique: One rail Left;Sideways Number of Stairs: 4  Wheelchair Mobility Wheelchair Mobility: No    Exercises Total Joint Exercises Ankle Circles/Pumps: AROM;Both;10 reps Quad Sets: AROM;Both;10 reps Straight Leg Raises: AAROM;Left;10 reps Long Arc Quad: AAROM;Left;10 reps Knee Flexion: AAROM;Left;10 reps Goniometric ROM: AAROM ~ 10 - 65 degrees   PT Diagnosis:    PT Problem List:   PT Treatment Interventions:     PT Goals Acute Rehab PT Goals Time For Goal Achievement: 02/24/12 Potential to Achieve Goals: Good PT Goal: Sit to Stand - Progress: Progressing toward goal PT Goal: Stand to Sit - Progress: Progressing toward goal PT Goal: Ambulate - Progress: Progressing toward goal PT Goal: Up/Down Stairs - Progress: Progressing toward goal PT Goal: Perform Home Exercise Program - Progress: Progressing toward goal  Visit Information  Last PT Received On: 02/18/12 Assistance Needed: +1    Subjective Data  Subjective: I have trouble with anesthesia.     Cognition  Overall Cognitive Status: Appears within functional limits for tasks assessed/performed Arousal/Alertness: Awake/alert Orientation Level: Appears intact for tasks assessed Behavior During Session: [facility] for tasks performed    Balance  Balance Balance Assessed: Yes Static Standing Balance Static Standing - Balance Support: No upper extremity supported;During functional activity Static Standing - Level of Assistance: 6: Modified independent (Device/Increase time) Static Standing - Comment/# of Minutes: pt able to stand at toilet and at sink performing ADL tasks.  End of Session PT - End of Session Equipment Utilized During Treatment: Gait  belt Activity Tolerance: Treatment limited secondary to medical complications (Comment) (pt limited by nausea) Patient left: in chair;with call bell/phone within reach Nurse Communication: Mobility status (Request Nausea meds)   GP     Sunny Schlein, [place] [phone] 02/18/2012, 12:47 PM

## 2012-02-18 NOTE — Progress Notes (Signed)
Physical Therapy Treatment Patient Details Name: [name]. MRN: [id number] DOB: [birth year] Today's Date: 02/18/2012 Time: 4098-1191 PT Time Calculation (min): 29 min  PT Assessment / Plan / Recommendation Comments on Treatment Session  pt presents with L TKA.  pt feeling much better this pm and moving very well.  pt ready for D/C from PT stand point.  pt will need RW for D/C.      Follow Up Recommendations  Home health PT;Supervision - Intermittent     Does the patient have the potential to tolerate intense rehabilitation     Barriers to Discharge        Equipment Recommendations  Rolling walker with 5" wheels    Recommendations for Other Services    Frequency 7X/week   Plan Discharge plan remains appropriate;Frequency remains appropriate    Precautions / Restrictions Precautions Precautions: Knee Precaution Comments: Per pt Dr [name] told him this am he no longer had to wear KI.   Restrictions Weight Bearing Restrictions: Yes LLE Weight Bearing: Weight bearing as tolerated   Pertinent Vitals/Pain Indicates more stiffness than pain this pm.      Mobility  Bed Mobility Bed Mobility: Not assessed Supine to Sit: 6: Modified independent (Device/Increase time);HOB flat Sitting - Scoot to Edge of Bed: 6: Modified independent (Device/Increase time) Details for Bed Mobility Assistance: cues for use of sheet or belt to A with movement of L LE at home.   Transfers Transfers: Sit to Stand;Stand to Sit Sit to Stand: 5: Supervision;With upper extremity assist;From chair/3-in-1;With armrests Stand to Sit: 5: Supervision;With upper extremity assist;To bed Details for Transfer Assistance: demos good technique.   Ambulation/Gait Ambulation/Gait Assistance: 5: Supervision Ambulation Distance (Feet): 300 Feet Assistive device: Rolling walker Ambulation/Gait Assistance Details: cues for increased WBing on L LE, upright posture.   Gait Pattern: Decreased step length -  right;Decreased stance time - left;Decreased stride length;Step-through pattern Stairs: No Stairs Assistance: 4: Min guard Stair Management Technique: One rail Left;Sideways Number of Stairs: 4  Wheelchair Mobility Wheelchair Mobility: No    Exercises Total Joint Exercises Ankle Circles/Pumps: AROM;Both;10 reps Quad Sets: AROM;Both;10 reps Straight Leg Raises: AAROM;Left;10 reps Long Arc Quad: AAROM;Left;10 reps Knee Flexion: AAROM;Left;10 reps Goniometric ROM: AAROM ~ 10 - 65 degrees   PT Diagnosis:    PT Problem List:   PT Treatment Interventions:     PT Goals Acute Rehab PT Goals Time For Goal Achievement: 02/24/12 Potential to Achieve Goals: Good PT Goal: Sit to Stand - Progress: Progressing toward goal PT Goal: Stand to Sit - Progress: Progressing toward goal PT Goal: Ambulate - Progress: Progressing toward goal PT Goal: Up/Down Stairs - Progress: Progressing toward goal PT Goal: Perform Home Exercise Program - Progress: Progressing toward goal  Visit Information  Last PT Received On: 02/18/12 Assistance Needed: +1    Subjective Data  Subjective: I feel better than this morning.     Cognition  Overall Cognitive Status: Appears within functional limits for tasks assessed/performed Arousal/Alertness: Awake/alert Orientation Level: Appears intact for tasks assessed Behavior During Session: [facility] for tasks performed    Balance  Balance Balance Assessed: No Static Standing Balance Static Standing - Balance Support: No upper extremity supported;During functional activity Static Standing - Level of Assistance: 6: Modified independent (Device/Increase time) Static Standing - Comment/# of Minutes: pt able to stand at toilet and at sink performing ADL tasks.    End of Session PT - End of Session Equipment Utilized During Treatment: Gait belt Activity Tolerance: Patient tolerated treatment  well Patient left: in bed;with call bell/phone within reach;with family/visitor  present (Sitting EOB with wife to do bath.  ) Nurse Communication: Mobility status   GP     Sunny Schlein, [place] [phone] 02/18/2012, 3:46 PM

## 2012-02-18 NOTE — Progress Notes (Signed)
Subjective: 2 Days Post-Op Procedure(s) (LRB): TOTAL KNEE ARTHROPLASTY (Left) Patient reports pain as mild.    Objective: Vital signs in last 24 hours: Temp:  [98.4 F (36.9 C)-98.7 F (37.1 C)] 98.6 F (37 C) (02/02 0728) Pulse Rate:  [76-83] 83  (02/02 0728) Resp:  [16-18] 16  (02/02 0728) BP: (122-144)/(72-81) 122/74 mmHg (02/02 0728) SpO2:  [95 %-99 %] 96 % (02/02 0728) Weight:  [96.3 kg (212 lb 4.9 oz)] 96.3 kg (212 lb 4.9 oz) (02/01 1100)  Intake/Output from previous day: 02/01 0701 - 02/02 0700 In: 560 [P.O.:560] Out: 1400 [Urine:1400] Intake/Output this shift:     Basename 02/18/12 0710 02/17/12 0615  HGB 12.7* 14.0    Basename 02/18/12 0710 02/17/12 0615  WBC 11.5* 13.2*  RBC 4.16* 4.57  HCT 37.3* 41.5  PLT 155 173    Basename 02/17/12 0615  NA 138  K 4.1  CL 103  CO2 28  BUN 8  CREATININE 0.70  GLUCOSE 112*  CALCIUM 8.6    Basename 02/18/12 0710 02/17/12 0615  LABPT -- --  INR 1.37 1.01    Wound clean, dry, intact with no erythema. No change neurovascular status   Assessment/Plan: 2 Days Post-Op Procedure(s) (LRB): TOTAL KNEE ARTHROPLASTY (Left) Up with therapy Coumadin DVT Prophylaxis with Lovenox bridge.  Not therapeutic yet on coumadin to d/c lovenox   [name] A 02/18/2012, 10:31 AM

## 2012-02-18 NOTE — Progress Notes (Signed)
Occupational Therapy Evaluation Patient Details Name: [name]. MRN: [id number] DOB: [birth year] Today's Date: 02/18/2012 Time: 4098-1191 OT Time Calculation (min): 18 min  OT Assessment / Plan / Recommendation Clinical Impression  52 yo s/p TKA. Completed all education regarding compensatory techniques for ADL, available AE and shower transfer techniques. Pt's wife will be able to provide 24/7 S after D/C. Pt ready to D/C in am. No further OT needed at this time.    OT Assessment  Patient does not need any further OT services    Follow Up Recommendations  No OT follow up    Barriers to Discharge    none  Equipment Recommendations  None recommended by OT    Recommendations for Other Services  none  Frequency    eval only   Precautions / Restrictions Precautions Precautions: Knee Precaution Comments: Per pt Dr [name] told him this am he no longer had to wear KI.   Knee Immobilizer - Left:  (on when in bed only) Restrictions Weight Bearing Restrictions: Yes LLE Weight Bearing: Weight bearing as tolerated   Pertinent Vitals/Pain 4. Knee. Ice applied    ADL  Lower Body Bathing: Minimal assistance Where Assessed - Lower Body Bathing: Unsupported sit to stand Upper Body Dressing: Set up Where Assessed - Upper Body Dressing: Unsupported sitting Lower Body Dressing: Minimal assistance Where Assessed - Lower Body Dressing: Unsupported sit to stand Toilet Transfer: Modified independent Toilet Transfer Method: Sit to Barista: Comfort height toilet Toileting - Clothing Manipulation and Hygiene: Modified independent Where Assessed - Engineer, mining and Hygiene: Sit to stand from 3-in-1 or toilet Tub/Shower Transfer:  (demonstrated technique for shower transfer) Transfers/Ambulation Related to ADLs: mod I ADL Comments: completed education regarding available AE for ADl and home safety    OT Diagnosis:    OT Problem List:   OT  Treatment Interventions:     OT Goals Acute Rehab OT Goals OT Goal Formulation:  (eval only)  Visit Information  Last OT Received On: 02/18/12 Assistance Needed: +1    Subjective Data      Prior Functioning     Home Living Lives With: Spouse Available Help at Discharge: Family;Available 24 hours/day Type of Home: House Home Access: Stairs to enter [employer] of Steps: 5 Entrance Stairs-Rails: Right;Left Home Layout: One level Bathroom Shower/Tub: Walk-in Contractor: Standard Bathroom Accessibility: Yes Home Adaptive Equipment: Built-in shower seat;Straight cane;Walker - rolling Prior Function Level of Independence: Independent Able to Take Stairs?: Yes Driving: Yes Vocation: Full time employment Communication Communication: No difficulties         Vision/Perception     Cognition  Overall Cognitive Status: Appears within functional limits for tasks assessed/performed Arousal/Alertness: Awake/alert Orientation Level: Appears intact for tasks assessed Behavior During Session: [facility] for tasks performed    Extremity/Trunk Assessment Right Upper Extremity Assessment RUE ROM/Strength/Tone: Within functional levels Left Upper Extremity Assessment LUE ROM/Strength/Tone: Within functional levels Right Lower Extremity Assessment RLE ROM/Strength/Tone: WFL for tasks assessed Left Lower Extremity Assessment LLE ROM/Strength/Tone: Unable to fully assess     Mobility Bed Mobility Bed Mobility: Supine to Sit;Sit to Supine Supine to Sit: 6: Modified independent (Device/Increase time) Sitting - Scoot to Edge of Bed: 6: Modified independent (Device/Increase time) Sit to Supine: 6: Modified independent (Device/Increase time) Transfers Sit to Stand: 5: Supervision;With upper extremity assist;From chair/3-in-1;With armrests Stand to Sit: 5: Supervision;With upper extremity assist;To bed Details for Transfer Assistance: demos good technique.  Shoulder Instructions     Exercise  Discussed importance of terminal extension   Balance Balance Balance Assessed: No   End of Session OT - End of Session Activity Tolerance: Patient tolerated treatment well Patient left: in bed;with call bell/phone within reach Nurse Communication: Mobility status CPM Left Knee CPM Left Knee: On Left Knee Flexion (Degrees): 70  Left Knee Extension (Degrees): 0   GO     Mahonri Seiden,HILL[place] 02/18/2012, 6:25 PM [facility], OTR/L  [phone] 02/18/2012

## 2012-02-18 NOTE — Progress Notes (Signed)
ANTICOAGULATION CONSULT NOTE - Follow Up Consult  Pharmacy Consult for Coumadin Indication: VTE prophylaxis  Lovenox Dosing Weight: 96 kg  Vital Signs: BP 122/74  Pulse 83  Temp 98.6 F (37 C) (Oral)  Resp 18  Ht 6' 0.05" (1.83 m)  Wt 212 lb 4.9 oz (96.3 kg)  BMI 28.76 kg/m2  SpO2 96%  Labs:  Basename 02/18/12 0710 02/17/12 0615  HGB 12.7* 14.0  HCT 37.3* 41.5  PLT 155 173  CREATININE -- 0.70   Lab Results  Component Value Date   INR 1.37 02/18/2012   INR 1.01 02/17/2012   INR 1.7* 01/18/2008   Estimated Creatinine Clearance: 130 ml/min (by C-G formula based on Cr of 0.7).  Assessment:  POD # 2 following L-TKA on Coumadin with Lovenox bridging for VTE prophylaxis .  Goal of Therapy:  Target INR 2-3    Plan:  Coumadin 7.5 mg today.  Continue Lovenox bridging.  Daily INR's, CBC.    [name], Deetta Perla.D 02/18/2012, 1:07 PM

## 2012-02-19 ENCOUNTER — Encounter (HOSPITAL_COMMUNITY): Payer: Self-pay | Admitting: Orthopedic Surgery

## 2012-02-19 LAB — PROTIME-INR
INR: 1.61 — ABNORMAL HIGH (ref 0.00–1.49)
Prothrombin Time: 18.6 seconds — ABNORMAL HIGH (ref 11.6–15.2)

## 2012-02-19 LAB — CBC
HCT: 37 % — ABNORMAL LOW (ref 39.0–52.0)
Hemoglobin: 12.4 g/dL — ABNORMAL LOW (ref 13.0–17.0)
MCH: 30 pg (ref 26.0–34.0)
MCHC: 33.5 g/dL (ref 30.0–36.0)
MCV: 89.4 fL (ref 78.0–100.0)
Platelets: 165 10*3/uL (ref 150–400)
RBC: 4.14 MIL/uL — ABNORMAL LOW (ref 4.22–5.81)
RDW: 12.6 % (ref 11.5–15.5)
WBC: 10.4 10*3/uL (ref 4.0–10.5)

## 2012-02-19 NOTE — Care Management Note (Signed)
    Page 1 of 2   02/19/2012     2:11:59 PM   CARE MANAGEMENT NOTE 02/19/2012  Patient:  [name], [name]   Account Number:  [id number]  Date Initiated:  02/19/2012  Documentation initiated by:  Anette Guarneri  Subjective/Objective Assessment:   left TKA  lives w/spouse, plan d/c home w/HH services     Action/Plan:   [facility] services,  DME   Anticipated DC Date:  02/19/2012   Anticipated DC Plan:  HOME W HOME HEALTH SERVICES      DC Planning Services  CM consult      [facility] Choice  HOME HEALTH   Choice offered to / List presented to:  C-1 Patient        HH arranged  HH-2 PT  HH-1 RN      [facility] agency  [facility]   Status of service:  Completed, signed off Medicare Important Message given?  NO (If response is "NO", the following Medicare IM given date fields will be blank) Date Medicare IM given:   Date Additional Medicare IM given:    Discharge Disposition:  HOME W HOME HEALTH SERVICES  Per UR Regulation:  Reviewed for med. necessity/level of care/duration of stay  If discussed at Long Length of Stay Meetings, dates discussed:    Comments:  02/19/12  14:10 Anette Guarneri RN/CM Spoke with patient regarding d/c needs. Per patient he has DME, CPM not needed. Patient choice for [facility] services is [facility] Contacted [facility], [facility] services will begin after d/c.

## 2012-02-19 NOTE — Discharge Summary (Signed)
Physician Discharge Summary   Patient ID: [name]. MRN: [id number] DOB/AGE: 1960-05-06 52 y.o.  Admit date: 02/16/2012 Discharge date: 02/19/2012  Admission Diagnoses:  Principal Problem:  *Osteoarthrosis, unspecified whether generalized or localized, lower leg   Discharge Diagnoses:  Same   Surgeries: Procedure(s): TOTAL KNEE ARTHROPLASTY on 02/16/2012   Consultants: PT/OT  Discharged Condition: Stable  Hospital Course: [name]. is an 52 y.o. male who was admitted 02/16/2012 with a chief complaint of No chief complaint on file. , and found to have a diagnosis of Osteoarthrosis, unspecified whether generalized or localized, lower leg.  They were brought to the operating room on 02/16/2012 and underwent the above named procedures.    The patient had an uncomplicated hospital course and was stable for discharge.  Recent vital signs:  Filed Vitals:   02/19/12 0601  BP: 142/85  Pulse: 83  Temp: 98.1 F (36.7 C)  Resp: 18    Recent laboratory studies:  Results for orders placed during the hospital encounter of 02/16/12  PROTIME-INR      Component Value Range   Prothrombin Time 13.2  11.6 - 15.2 seconds   INR 1.01  0.00 - 1.49  CBC      Component Value Range   WBC 13.2 (*) 4.0 - 10.5 K/uL   RBC 4.57  4.22 - 5.81 MIL/uL   Hemoglobin 14.0  13.0 - 17.0 g/dL   HCT 40.9  81.1 - 91.4 %   MCV 90.8  78.0 - 100.0 fL   MCH 30.6  26.0 - 34.0 pg   MCHC 33.7  30.0 - 36.0 g/dL   RDW 78.2  95.6 - 21.3 %   Platelets 173  150 - 400 K/uL  BASIC METABOLIC PANEL      Component Value Range   Sodium 138  135 - 145 mEq/L   Potassium 4.1  3.5 - 5.1 mEq/L   Chloride 103  96 - 112 mEq/L   CO2 28  19 - 32 mEq/L   Glucose, Bld 112 (*) 70 - 99 mg/dL   BUN 8  6 - 23 mg/dL   Creatinine, Ser 0.86  0.50 - 1.35 mg/dL   Calcium 8.6  8.4 - 57.8 mg/dL   GFR calc non Af Amer >90  >90 mL/min   GFR calc Af Amer >90  >90 mL/min  PROTIME-INR      Component Value Range   Prothrombin  Time 16.5 (*) 11.6 - 15.2 seconds   INR 1.37  0.00 - 1.49  CBC      Component Value Range   WBC 11.5 (*) 4.0 - 10.5 K/uL   RBC 4.16 (*) 4.22 - 5.81 MIL/uL   Hemoglobin 12.7 (*) 13.0 - 17.0 g/dL   HCT 46.9 (*) 62.9 - 52.8 %   MCV 89.7  78.0 - 100.0 fL   MCH 30.5  26.0 - 34.0 pg   MCHC 34.0  30.0 - 36.0 g/dL   RDW 41.3  24.4 - 01.0 %   Platelets 155  150 - 400 K/uL  PROTIME-INR      Component Value Range   Prothrombin Time 18.6 (*) 11.6 - 15.2 seconds   INR 1.61 (*) 0.00 - 1.49  CBC      Component Value Range   WBC 10.4  4.0 - 10.5 K/uL   RBC 4.14 (*) 4.22 - 5.81 MIL/uL   Hemoglobin 12.4 (*) 13.0 - 17.0 g/dL   HCT 27.2 (*) 53.6 - 64.4 %  MCV 89.4  78.0 - 100.0 fL   MCH 30.0  26.0 - 34.0 pg   MCHC 33.5  30.0 - 36.0 g/dL   RDW 16.1  09.6 - 04.5 %   Platelets 165  150 - 400 K/uL    Discharge Medications:     Medication List     As of 02/19/2012  9:46 AM    TAKE these medications         amLODipine-benazepril 5-10 MG per capsule   Commonly known as: LOTREL   Take 1 capsule by mouth daily.      aspirin EC 81 MG tablet   Take 81 mg by mouth daily.      calcium citrate-vitamin D 315-200 MG-UNIT per tablet   Commonly known as: CITRACAL+D   Take 1 tablet by mouth 2 (two) times daily.      methocarbamol 500 MG tablet   Commonly known as: ROBAXIN   Take 1 tablet (500 mg total) by mouth 3 (three) times daily as needed.      metoprolol succinate 25 MG 24 hr tablet   Commonly known as: TOPROL-XL   Take 25 mg by mouth daily.      niacin 50 MG tablet   Take 50 mg by mouth daily with breakfast.      oxyCODONE-acetaminophen 5-325 MG per tablet   Commonly known as: PERCOCET/ROXICET   Take 1-2 tablets by mouth every 4 (four) hours as needed for pain.      warfarin 5 MG tablet   Commonly known as: COUMADIN   Take 1 tablet (5 mg total) by mouth daily.        Diagnostic Studies: Dg Chest 2 View  02/08/2012  *RADIOLOGY REPORT*  Clinical Data: Preop for left knee  arthroplasty  CHEST - 2 VIEW  Comparison: 01/15/2008  Findings: Cardiomediastinal silhouette is stable.  No acute infiltrate or pleural effusion.  No pulmonary edema.  Bony thorax is unremarkable.  IMPRESSION: No active disease.  No significant change.   Original Report Authenticated By: Natasha Mead, M.D.    Dg Knee Left Port  02/16/2012  *RADIOLOGY REPORT*  Clinical Data: Left knee replacement.  PORTABLE LEFT KNEE - 1-2 VIEW  Comparison: 08/19/2005  Findings: Changes of left knee replacement.  Soft tissue and joint space gas.  Multiple bone densities noted superior to the patella, new since 2007 but likely chronic soft tissue calcifications as they are well corticated.  No hardware or bony complicating feature.  IMPRESSION: Left knee replacement.  No complicating feature.   Original Report Authenticated By: Charlett Nose, M.D.     Disposition:         Follow-up Information    Follow up with NORRIS,STEVEN R, MD. Call in 2 weeks. [phone])    Contact information:   [address], [address] [place] [address] [phone]           Signed: [name] 02/19/2012, 9:46 AM

## 2012-02-19 NOTE — Progress Notes (Signed)
   Subjective: 3 Days Post-Op Procedure(s) (LRB): TOTAL KNEE ARTHROPLASTY (Left)   Patient reports pain as mild.  Objective:   VITALS:   Filed Vitals:   02/19/12 0601  BP: 142/85  Pulse: 83  Temp: 98.1 F (36.7 C)  Resp: 18   Left knee incision healing well, no erythema or drainage Neurologically intact  LABS  Basename 02/19/12 0725 02/18/12 0710 02/17/12 0615  HGB 12.4* 12.7* 14.0  HCT 37.0* 37.3* 41.5  WBC 10.4 11.5* 13.2*  PLT 165 155 173     Basename 02/17/12 0615  NA 138  K 4.1  BUN 8  CREATININE 0.70  GLUCOSE 112*     Assessment/Plan: 3 Days Post-Op Procedure(s) (LRB): TOTAL KNEE ARTHROPLASTY (Left)   Discharge home with home health   Alphonsa Overall, MPAS, PA-C  02/19/2012, 9:45 AM

## 2012-02-19 NOTE — Progress Notes (Signed)
Physical Therapy Treatment Patient Details Name: [name]. MRN: [id number] DOB: [birth year] Today's Date: 02/19/2012 Time: 2956-2130 PT Time Calculation (min): 27 min  PT Assessment / Plan / Recommendation Comments on Treatment Session  pt presents with L TKA.  pt moving great and able to ambulate around the whole unti without difficulty and performed stairs yesterday.  pt ready for D/C from PT stand point.      Follow Up Recommendations  Home health PT;Supervision - Intermittent     Does the patient have the potential to tolerate intense rehabilitation     Barriers to Discharge        Equipment Recommendations  None recommended by PT (pt notes found a RW at home.  )    Recommendations for Other Services    Frequency 7X/week   Plan Discharge plan remains appropriate;Frequency remains appropriate    Precautions / Restrictions Precautions Precautions: Knee Restrictions Weight Bearing Restrictions: Yes LLE Weight Bearing: Weight bearing as tolerated   Pertinent Vitals/Pain Indicates minimal pain.      Mobility  Bed Mobility Bed Mobility: Supine to Sit;Sitting - Scoot to Edge of Bed Supine to Sit: 6: Modified independent (Device/Increase time) Sitting - Scoot to Edge of Bed: 6: Modified independent (Device/Increase time) Transfers Transfers: Sit to Stand;Stand to Sit Sit to Stand: 6: Modified independent (Device/Increase time);With upper extremity assist;From bed Stand to Sit: 6: Modified independent (Device/Increase time);With upper extremity assist;To chair/3-in-1;With armrests Ambulation/Gait Ambulation/Gait Assistance: 5: Supervision Ambulation Distance (Feet): 500 Feet Assistive device: Rolling walker Ambulation/Gait Assistance Details: cues for increased WBing on L LE.   Gait Pattern: Decreased step length - right;Decreased stance time - left;Decreased stride length;Step-through pattern Stairs: No Wheelchair Mobility Wheelchair Mobility: No    Exercises      PT Diagnosis:    PT Problem List:   PT Treatment Interventions:     PT Goals Acute Rehab PT Goals Time For Goal Achievement: 02/24/12 Potential to Achieve Goals: Good PT Goal: Sit to Stand - Progress: Met PT Goal: Stand to Sit - Progress: Met PT Goal: Ambulate - Progress: Partly met  Visit Information  Last PT Received On: 02/19/12 Assistance Needed: +1    Subjective Data  Subjective: I'm doing great today.     Cognition  Overall Cognitive Status: Appears within functional limits for tasks assessed/performed Arousal/Alertness: Awake/alert Orientation Level: Appears intact for tasks assessed Behavior During Session: [facility] for tasks performed    Balance  Balance Balance Assessed: Yes Static Standing Balance Static Standing - Balance Support: No upper extremity supported;During functional activity Static Standing - Level of Assistance: 6: Modified independent (Device/Increase time) Static Standing - Comment/# of Minutes: pt able to stand at toilet and then at sink without UE support.    End of Session PT - End of Session Activity Tolerance: Patient tolerated treatment well Patient left: in chair;with call bell/phone within reach Nurse Communication: Mobility status   GP     Sunny Schlein, [place] [phone] 02/19/2012, 8:30 AM

## 2012-02-19 NOTE — Progress Notes (Signed)
Discharge Education completed and all home health set up. Awaiting wife to pick up patient and bring clothes

## 2012-03-02 ENCOUNTER — Other Ambulatory Visit: Payer: Self-pay

## 2012-03-12 ENCOUNTER — Ambulatory Visit: Payer: BC Managed Care – PPO | Attending: Orthopedic Surgery | Admitting: Physical Therapy

## 2012-03-12 DIAGNOSIS — M25569 Pain in unspecified knee: Secondary | ICD-10-CM | POA: Insufficient documentation

## 2012-03-12 DIAGNOSIS — R5381 Other malaise: Secondary | ICD-10-CM | POA: Insufficient documentation

## 2012-03-12 DIAGNOSIS — IMO0001 Reserved for inherently not codable concepts without codable children: Secondary | ICD-10-CM | POA: Insufficient documentation

## 2012-03-12 DIAGNOSIS — M25669 Stiffness of unspecified knee, not elsewhere classified: Secondary | ICD-10-CM | POA: Insufficient documentation

## 2012-03-13 ENCOUNTER — Ambulatory Visit: Payer: BC Managed Care – PPO | Admitting: Physical Therapy

## 2012-03-15 ENCOUNTER — Ambulatory Visit: Payer: BC Managed Care – PPO | Admitting: Physical Therapy

## 2012-03-18 ENCOUNTER — Encounter: Payer: BC Managed Care – PPO | Admitting: Physical Therapy

## 2012-03-20 ENCOUNTER — Ambulatory Visit: Payer: BC Managed Care – PPO | Attending: Orthopedic Surgery | Admitting: Physical Therapy

## 2012-03-20 DIAGNOSIS — M25569 Pain in unspecified knee: Secondary | ICD-10-CM | POA: Insufficient documentation

## 2012-03-20 DIAGNOSIS — R5381 Other malaise: Secondary | ICD-10-CM | POA: Insufficient documentation

## 2012-03-20 DIAGNOSIS — IMO0001 Reserved for inherently not codable concepts without codable children: Secondary | ICD-10-CM | POA: Insufficient documentation

## 2012-03-20 DIAGNOSIS — M25669 Stiffness of unspecified knee, not elsewhere classified: Secondary | ICD-10-CM | POA: Insufficient documentation

## 2012-03-21 ENCOUNTER — Ambulatory Visit: Payer: BC Managed Care – PPO | Admitting: Physical Therapy

## 2012-03-22 ENCOUNTER — Encounter: Payer: BC Managed Care – PPO | Admitting: Physical Therapy

## 2012-03-25 ENCOUNTER — Ambulatory Visit: Payer: BC Managed Care – PPO | Admitting: Physical Therapy

## 2012-03-27 ENCOUNTER — Ambulatory Visit: Payer: BC Managed Care – PPO | Admitting: Physical Therapy

## 2012-03-29 ENCOUNTER — Ambulatory Visit: Payer: BC Managed Care – PPO | Admitting: *Deleted

## 2012-04-01 ENCOUNTER — Ambulatory Visit: Payer: BC Managed Care – PPO | Admitting: *Deleted

## 2012-04-03 ENCOUNTER — Ambulatory Visit: Payer: BC Managed Care – PPO | Admitting: Physical Therapy

## 2012-04-04 ENCOUNTER — Ambulatory Visit: Payer: BC Managed Care – PPO | Admitting: Physical Therapy

## 2012-04-04 ENCOUNTER — Ambulatory Visit (INDEPENDENT_AMBULATORY_CARE_PROVIDER_SITE_OTHER): Payer: BC Managed Care – PPO | Admitting: Surgery

## 2012-04-04 ENCOUNTER — Encounter (INDEPENDENT_AMBULATORY_CARE_PROVIDER_SITE_OTHER): Payer: Self-pay | Admitting: Surgery

## 2012-04-04 VITALS — BP 125/80 | HR 78 | Temp 98.2°F | Resp 16 | Ht 72.0 in | Wt 207.8 lb

## 2012-04-04 DIAGNOSIS — D171 Benign lipomatous neoplasm of skin and subcutaneous tissue of trunk: Secondary | ICD-10-CM | POA: Insufficient documentation

## 2012-04-04 DIAGNOSIS — D1739 Benign lipomatous neoplasm of skin and subcutaneous tissue of other sites: Secondary | ICD-10-CM

## 2012-04-04 NOTE — Progress Notes (Signed)
Re:   [name]. DOB:   1960/08/21 MRN:   [id number]  ASSESSMENT AND PLAN: 1.  Lipoma, right lower chest wall.  Discussed the indications and complications of surgery. The potential risks of surgery include bleeding, nerve injury, recurrence of the lipoma, and infection.  I gave him literature on lipomas.  I will schedule this surgery at time convinient with him.  2.  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 Dietrich Pates 3.  Left 5th finger surgery next week  Dr. Stefano Gaul 4.  Hypertension  Chief Complaint  Patient presents with  . Lipoma   REFERRING PHYSICIAN: Redmond Baseman, MD  HISTORY OF PRESENT ILLNESS: [name]. is a 52 y.o. (DOB: [birth year])  white  male whose primary care physician is Redmond Baseman, MD and comes to me today for mass on his right chest.  I saw the patient in 2010 for a cyst of his back. At that time he showed me a mass on his right chest and it measured 4 x 5 cm. The mass has clearly enlarged over time and is now causing discomfort and he is interested in having this removed. He is getting over a left total knee surgery. Dr. Bradly Bienenstock plans to operate on his left hand next week.   Past Medical History  Diagnosis Date  . Hypertension     on meds for 8 years  . Hyperlipemia      Current Outpatient Prescriptions  Medication Sig Dispense Refill  . amLODipine-benazepril (LOTREL) 5-10 MG per capsule Take 1 capsule by mouth daily.      Marland Kitchen aspirin EC 81 MG tablet Take 81 mg by mouth daily.      . calcium citrate-vitamin D (CITRACAL+D) 315-200 MG-UNIT per tablet Take 1 tablet by mouth 2 (two) times daily.      . methocarbamol (ROBAXIN) 500 MG tablet Take 1 tablet (500 mg total) by mouth 3 (three) times daily as needed.  60 tablet  1  . metoprolol succinate (TOPROL-XL) 25 MG 24 hr tablet Take 25 mg by mouth daily.      . niacin 50 MG tablet Take 50 mg by mouth daily with breakfast.      . oxyCODONE-acetaminophen  (ROXICET) 5-325 MG per tablet Take 1-2 tablets by mouth every 4 (four) hours as needed for pain.  60 tablet  0  . warfarin (COUMADIN) 5 MG tablet Take 1 tablet (5 mg total) by mouth daily.  40 tablet  0   No current facility-administered medications for this visit.     No Known Allergies  REVIEW OF SYSTEMS: Skin:  No history of rash.  No history of abnormal moles. Musculoskeletal: Both knees now replaced, though he said the left did not go as well as the right.  Left 5th digit surgery next week - Dr. Melvyn Novas.  SOCIAL and FAMILY HISTORY: Works for [employer].  PHYSICAL EXAM: BP 125/80  Pulse 78  Temp(Src) 98.2 F (36.8 C) (Temporal)  Resp 16  Ht 6' (1.829 m)  Wt 207 lb 12.8 oz (94.257 kg)  BMI 28.18 kg/m2  General: WN WM who is alert and generally healthy appearing.  HEENT: Normal. Pupils equal. Lymph Nodes:  No supraclavicular or cervical nodes. Lungs: Clear to auscultation and symmetric breath sounds.  On his right chest, above the right costal margin, is a 6 x 8 cm soft mass. Heart:  RRR. No murmur or rub. Abdomen: Soft. No mass. No tenderness. Marland Kitchen  No  abdominal scars. Extremities:  He showed me deformity of left 5 th digit  DATA REVIEWED: Epic notes.  Ovidio Kin, MD,  [facility] [address] [place]  [address]   [place], [place]    [address] Phone:  [phone] FAX:  [phone]

## 2012-04-10 ENCOUNTER — Ambulatory Visit: Payer: BC Managed Care – PPO | Admitting: Physical Therapy

## 2012-04-12 ENCOUNTER — Ambulatory Visit: Payer: BC Managed Care – PPO | Admitting: Physical Therapy

## 2012-04-15 ENCOUNTER — Telehealth (INDEPENDENT_AMBULATORY_CARE_PROVIDER_SITE_OTHER): Payer: Self-pay

## 2012-04-15 NOTE — Telephone Encounter (Signed)
Patient's wife aware of P/O appointment with Dr. Ezzard Standing on 05/09/12@11 :50am. She will inform patient.

## 2012-04-16 ENCOUNTER — Ambulatory Visit: Payer: BC Managed Care – PPO | Attending: Orthopedic Surgery | Admitting: Physical Therapy

## 2012-04-16 DIAGNOSIS — M25569 Pain in unspecified knee: Secondary | ICD-10-CM | POA: Insufficient documentation

## 2012-04-16 DIAGNOSIS — R5381 Other malaise: Secondary | ICD-10-CM | POA: Insufficient documentation

## 2012-04-16 DIAGNOSIS — M25669 Stiffness of unspecified knee, not elsewhere classified: Secondary | ICD-10-CM | POA: Insufficient documentation

## 2012-04-16 DIAGNOSIS — IMO0001 Reserved for inherently not codable concepts without codable children: Secondary | ICD-10-CM | POA: Insufficient documentation

## 2012-04-22 ENCOUNTER — Other Ambulatory Visit (INDEPENDENT_AMBULATORY_CARE_PROVIDER_SITE_OTHER): Payer: Self-pay | Admitting: Surgery

## 2012-04-22 DIAGNOSIS — D1739 Benign lipomatous neoplasm of skin and subcutaneous tissue of other sites: Secondary | ICD-10-CM

## 2012-05-09 ENCOUNTER — Ambulatory Visit (INDEPENDENT_AMBULATORY_CARE_PROVIDER_SITE_OTHER): Payer: BC Managed Care – PPO | Admitting: Surgery

## 2012-05-09 ENCOUNTER — Encounter (INDEPENDENT_AMBULATORY_CARE_PROVIDER_SITE_OTHER): Payer: Self-pay | Admitting: Surgery

## 2012-05-09 VITALS — BP 140/80 | HR 74 | Temp 97.4°F | Resp 20 | Ht 72.0 in | Wt 209.0 lb

## 2012-05-09 DIAGNOSIS — D171 Benign lipomatous neoplasm of skin and subcutaneous tissue of trunk: Secondary | ICD-10-CM

## 2012-05-09 DIAGNOSIS — D1739 Benign lipomatous neoplasm of skin and subcutaneous tissue of other sites: Secondary | ICD-10-CM

## 2012-05-09 NOTE — Progress Notes (Signed)
Re:   [name]. DOB:   1960/07/21 MRN:   [id number]  ASSESSMENT AND PLAN: 1.  Lipoma, right lower chest wall.  Removed at [facility] on 04/22/2012 - path to patient.  Return PRN  2.  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 Dietrich Pates 3.  Left 5th finger  This is doing well.  Dr. Stefano Gaul 4.  Hypertension  Chief Complaint  Patient presents with  . Routine Post Op    post op lipoma removal   REFERRING PHYSICIAN: Redmond Baseman, MD  HISTORY OF PRESENT ILLNESS: [name]. is a 52 y.o. (DOB: [birth year])  white  male whose primary care physician is Redmond Baseman, MD and comes to me today for follow up of a right chest lipoma.  He is doing well and the wound is healing well.  History of Lipoma: I saw the patient in 2010 for a cyst of his back. At that time he showed me a mass on his right chest and it measured 4 x 5 cm. The mass has clearly enlarged over time and is now causing discomfort and he is interested in having this removed. He is getting over a left total knee surgery. Dr. Bradly Bienenstock plans to operate on his left hand next week.   Past Medical History  Diagnosis Date  . Hypertension     on meds for 8 years  . Hyperlipemia      Current Outpatient Prescriptions  Medication Sig Dispense Refill  . amLODipine-benazepril (LOTREL) 5-10 MG per capsule Take 1 capsule by mouth daily.      Marland Kitchen aspirin EC 81 MG tablet Take 81 mg by mouth daily.      . calcium citrate-vitamin D (CITRACAL+D) 315-200 MG-UNIT per tablet Take 1 tablet by mouth 2 (two) times daily.      . metoprolol succinate (TOPROL-XL) 25 MG 24 hr tablet Take 25 mg by mouth daily.      . methocarbamol (ROBAXIN) 500 MG tablet Take 1 tablet (500 mg total) by mouth 3 (three) times daily as needed.  60 tablet  1  . niacin 50 MG tablet Take 50 mg by mouth daily with breakfast.      . oxyCODONE-acetaminophen (ROXICET) 5-325 MG per tablet Take 1-2 tablets by mouth every 4 (four) hours as needed  for pain.  60 tablet  0  . warfarin (COUMADIN) 5 MG tablet Take 1 tablet (5 mg total) by mouth daily.  40 tablet  0   No current facility-administered medications for this visit.     No Known Allergies  REVIEW OF SYSTEMS: Skin:  No history of rash.  No history of abnormal moles. Musculoskeletal: Both knees now replaced, though he said the left did not go as well as the right.  Left 5th digit surgery next week - Dr. Melvyn Novas.  SOCIAL and FAMILY HISTORY: Works for [employer].  PHYSICAL EXAM: BP 140/80  Pulse 74  Temp(Src) 97.4 F (36.3 C)  Resp 20  Ht 6' (1.829 m)  Wt 209 lb (94.802 kg)  BMI 28.34 kg/m2  General: WN WM who is alert and generally healthy appearing.  Chest:  Right chest wound looks good. Extremities:  Left 4th and 5th digit splinted.  DATA REVIEWED: Path to patient.  Nicholas Kin, MD,  [facility] [address] [place]  [address]   [place], [place]    [address] Phone:  [phone] FAX:  [phone]

## 2012-05-10 ENCOUNTER — Ambulatory Visit (INDEPENDENT_AMBULATORY_CARE_PROVIDER_SITE_OTHER): Payer: BC Managed Care – PPO

## 2012-05-10 ENCOUNTER — Ambulatory Visit: Payer: Self-pay | Admitting: Family Medicine

## 2012-05-10 ENCOUNTER — Other Ambulatory Visit: Payer: Self-pay | Admitting: Orthopedic Surgery

## 2012-05-10 DIAGNOSIS — M7989 Other specified soft tissue disorders: Secondary | ICD-10-CM

## 2012-05-10 DIAGNOSIS — M79645 Pain in left finger(s): Secondary | ICD-10-CM

## 2012-05-10 DIAGNOSIS — Z09 Encounter for follow-up examination after completed treatment for conditions other than malignant neoplasm: Secondary | ICD-10-CM

## 2012-05-30 ENCOUNTER — Other Ambulatory Visit: Payer: Self-pay | Admitting: Family Medicine

## 2012-05-31 ENCOUNTER — Other Ambulatory Visit (INDEPENDENT_AMBULATORY_CARE_PROVIDER_SITE_OTHER): Payer: BC Managed Care – PPO

## 2012-05-31 DIAGNOSIS — E559 Vitamin D deficiency, unspecified: Secondary | ICD-10-CM

## 2012-05-31 DIAGNOSIS — Z79899 Other long term (current) drug therapy: Secondary | ICD-10-CM

## 2012-05-31 DIAGNOSIS — E785 Hyperlipidemia, unspecified: Secondary | ICD-10-CM

## 2012-05-31 DIAGNOSIS — I10 Essential (primary) hypertension: Secondary | ICD-10-CM

## 2012-05-31 LAB — HEPATIC FUNCTION PANEL
ALT: 18 U/L (ref 0–53)
AST: 18 U/L (ref 0–37)
Albumin: 4.6 g/dL (ref 3.5–5.2)
Alkaline Phosphatase: 65 U/L (ref 39–117)
Bilirubin, Direct: 0.1 mg/dL (ref 0.0–0.3)
Indirect Bilirubin: 0.6 mg/dL (ref 0.0–0.9)
Total Bilirubin: 0.7 mg/dL (ref 0.3–1.2)
Total Protein: 6.9 g/dL (ref 6.0–8.3)

## 2012-05-31 LAB — BASIC METABOLIC PANEL WITH GFR
BUN: 12 mg/dL (ref 6–23)
CO2: 28 mEq/L (ref 19–32)
Calcium: 9.8 mg/dL (ref 8.4–10.5)
Chloride: 106 mEq/L (ref 96–112)
Creat: 0.84 mg/dL (ref 0.50–1.35)
GFR, Est African American: >89 mL/min
GFR, Est Non African American: >89 mL/min
Glucose, Bld: 91 mg/dL (ref 70–99)
Potassium: 4.6 mEq/L (ref 3.5–5.3)
Sodium: 141 mEq/L (ref 135–145)

## 2012-06-01 LAB — VITAMIN D 25 HYDROXY (VIT D DEFICIENCY, FRACTURES): Vit D, 25-Hydroxy: 44 ng/mL (ref 30–89)

## 2012-06-06 ENCOUNTER — Encounter: Payer: Self-pay | Admitting: Family Medicine

## 2012-06-06 ENCOUNTER — Ambulatory Visit (INDEPENDENT_AMBULATORY_CARE_PROVIDER_SITE_OTHER): Payer: BC Managed Care – PPO | Admitting: Family Medicine

## 2012-06-06 VITALS — BP 136/75 | HR 67 | Temp 97.5°F | Ht 72.0 in | Wt 220.0 lb

## 2012-06-06 DIAGNOSIS — I10 Essential (primary) hypertension: Secondary | ICD-10-CM

## 2012-06-06 DIAGNOSIS — E785 Hyperlipidemia, unspecified: Secondary | ICD-10-CM

## 2012-06-06 DIAGNOSIS — I214 Non-ST elevation (NSTEMI) myocardial infarction: Secondary | ICD-10-CM

## 2012-06-06 LAB — NMR LIPOPROFILE WITH LIPIDS
Cholesterol, Total: 172 mg/dL (ref <200)
HDL Particle Number: 30.5 umol/L (ref >=30.5)
HDL Size: 8.4 nm — ABNORMAL LOW (ref >=9.2)
HDL-C: 48 mg/dL (ref >=40)
LDL (calc): 103 mg/dL — ABNORMAL HIGH (ref <100)
LDL Particle Number: 1467 nmol/L — ABNORMAL HIGH (ref <1000)
LDL Size: 20.7 nm (ref >20.5)
LP-IR Score: 46 — ABNORMAL HIGH (ref <=45)
Large HDL-P: 1.9 umol/L — ABNORMAL LOW (ref >=4.8)
Large VLDL-P: <0.8 nmol/L (ref <=2.7)
Small LDL Particle Number: 607 nmol/L — ABNORMAL HIGH (ref <=527)
Triglycerides: 107 mg/dL (ref <150)
VLDL Size: 42.4 nm (ref <=46.6)

## 2012-06-06 NOTE — Progress Notes (Signed)
Patient ID: Nicholas Craven., male   DOB: Nov 20, 1960, 52 y.o.   MRN: [id number] SUBJECTIVE: HPI: Patient is here for follow up of hypertension:  denies Headache;deniesChest Pain;denies weakness;denies Shortness of Breath or Orthopnea;denies Visual changes;denies palpitations;denies cough;denies pedal edema;denies symptoms of TIA or stroke; admits to Compliance with medications. denies Problems with medications.   PMH/PSH: reviewed/updated in Epic  SH/FH: reviewed/updated in Epic  Allergies: reviewed/updated in Epic  Medications: reviewed/updated in Epic  Immunizations: reviewed/updated in Epic  ROS: As above in the HPI. All other systems are stable or negative.  OBJECTIVE: APPEARANCE:  Patient in no acute distress.The patient appeared well nourished and normally developed. Acyanotic. Waist: VITAL SIGNS:  SKIN: warm and  Dry without overt rashes, tattoos and scars  HEAD and Neck: without JVD, Head and scalp: normal Eyes:No scleral icterus. Fundi normal, eye movements normal. Ears: Auricle normal, canal normal, Tympanic membranes normal, insufflation normal. Nose: normal Throat: normal Neck & thyroid: normal  CHEST & LUNGS: Chest wall: normal Lungs: Clear  CVS: Reveals the PMI to be normally located. Regular rhythm, First and Second Heart sounds are normal,  absence of murmurs, rubs or gallops. Peripheral vasculature: Radial pulses: normal Dorsal pedis pulses: normal Posterior pulses: normal  ABDOMEN:  Appearance: normal Benign,, no organomegaly, no masses, no Abdominal Aortic enlargement. No Guarding , no rebound. No Bruits. Bowel sounds: normal  RECTAL: N/A GU: N/A  EXTREMETIES: nonedematous. Both Femoral and Pedal pulses are normal.  MUSCULOSKELETAL:  Spine: normal Left hand in a splintNEUROLOGIC: oriented to time,place and person; nonfocal. Strength is normal Sensory is normal Reflexes are normal Cranial Nerves are  normal.  ASSESSMENT: HYPERLIPIDEMIA-MIXED  HYPERTENSION, UNSPECIFIED  ACUT MI SUBENDOCARDIAL INFARCT SUBSQT EPIS CARE    PLAN: No orders of the defined types were placed in this encounter.   Results for orders placed in visit on 05/31/12  NMR LIPOPROFILE WITH LIPIDS      Result Value Range   LDL Particle Number 1467 (*) <1000 nmol/L   LDL (calc) 103 (*) <100 mg/dL   HDL-C 48  >=84 mg/dL   Triglycerides 696  <295 mg/dL   Cholesterol, Total 284  <200 mg/dL   HDL Particle Number 13.2  >=44.0 umol/L   Large HDL-P 1.9 (*) >=4.8 umol/L   Large VLDL-P <0.8  <=2.7 nmol/L   Small LDL Particle Number 607 (*) <=527 nmol/L   LDL Size 20.7  >20.5 nm   HDL Size 8.4 (*) >=9.2 nm   VLDL Size 42.4  <=46.6 nm   LP-IR Score 46 (*) <=45  VITAMIN D 25 HYDROXY      Result Value Range   Vit D, 25-Hydroxy 44  30 - 89 ng/mL  HEPATIC FUNCTION PANEL      Result Value Range   Total Bilirubin 0.7  0.3 - 1.2 mg/dL   Bilirubin, Direct 0.1  0.0 - 0.3 mg/dL   Indirect Bilirubin 0.6  0.0 - 0.9 mg/dL   Alkaline Phosphatase 65  39 - 117 U/L   AST 18  0 - 37 U/L   ALT 18  0 - 53 U/L   Total Protein 6.9  6.0 - 8.3 g/dL   Albumin 4.6  3.5 - 5.2 g/dL  BASIC METABOLIC PANEL WITH GFR      Result Value Range   Sodium 141  135 - 145 mEq/L   Potassium 4.6  3.5 - 5.3 mEq/L   Chloride 106  96 - 112 mEq/L   CO2 28  19 - 32 mEq/L  Glucose, Bld 91  70 - 99 mg/dL   BUN 12  6 - 23 mg/dL   Creat 1.61  0.96 - 0.45 mg/dL   Calcium 9.8  8.4 - 40.9 mg/dL   GFR, Est African American >89     GFR, Est Non African American >89     Labs reviewed with patient  RTC 3 months  Mekia Dipinto P. Modesto Charon, M.D.

## 2012-06-06 NOTE — Patient Instructions (Signed)
      Dr Desmond Tufano's Recommendations  Diet and Exercise discussed with patient.  For nutrition information, I recommend books:  1).Eat to Live by Dr Joel Fuhrman. 2).Prevent and Reverse Heart Disease by Dr Caldwell Esselstyn. 3) Dr Neal Barnard's Book: Reversing Diabetes  Exercise recommendations are:  If unable to walk, then the patient can exercise in a chair 3 times a day. By flapping arms like a bird gently and raising legs outwards to the front.  If ambulatory, the patient can go for walks for 30 minutes 3 times a week. Then increase the intensity and duration as tolerated.  Goal is to try to attain exercise frequency to 5 times a week.  If applicable: Best to perform resistance exercises (machines or weights) 2 days a week and cardio type exercises 3 days per week.  

## 2012-06-08 ENCOUNTER — Other Ambulatory Visit: Payer: Self-pay | Admitting: Family Medicine

## 2012-06-08 DIAGNOSIS — E785 Hyperlipidemia, unspecified: Secondary | ICD-10-CM

## 2012-06-08 MED ORDER — ATORVASTATIN CALCIUM 20 MG PO TABS: 20.0000 mg | ORAL_TABLET | Freq: Every day | ORAL | Status: DC

## 2012-07-02 ENCOUNTER — Telehealth: Payer: Self-pay | Admitting: Family Medicine

## 2012-08-12 ENCOUNTER — Ambulatory Visit: Payer: BC Managed Care – PPO | Attending: Neurosurgery | Admitting: Physical Therapy

## 2012-08-12 DIAGNOSIS — R5381 Other malaise: Secondary | ICD-10-CM | POA: Insufficient documentation

## 2012-08-12 DIAGNOSIS — Z96659 Presence of unspecified artificial knee joint: Secondary | ICD-10-CM | POA: Insufficient documentation

## 2012-08-12 DIAGNOSIS — M545 Low back pain, unspecified: Secondary | ICD-10-CM | POA: Insufficient documentation

## 2012-08-12 DIAGNOSIS — IMO0001 Reserved for inherently not codable concepts without codable children: Secondary | ICD-10-CM | POA: Insufficient documentation

## 2012-08-15 ENCOUNTER — Ambulatory Visit: Payer: BC Managed Care – PPO | Admitting: Physical Therapy

## 2012-08-20 ENCOUNTER — Ambulatory Visit: Payer: BC Managed Care – PPO | Attending: Neurosurgery | Admitting: Physical Therapy

## 2012-08-20 DIAGNOSIS — R5381 Other malaise: Secondary | ICD-10-CM | POA: Insufficient documentation

## 2012-08-20 DIAGNOSIS — Z96659 Presence of unspecified artificial knee joint: Secondary | ICD-10-CM | POA: Insufficient documentation

## 2012-08-20 DIAGNOSIS — M545 Low back pain, unspecified: Secondary | ICD-10-CM | POA: Insufficient documentation

## 2012-08-20 DIAGNOSIS — IMO0001 Reserved for inherently not codable concepts without codable children: Secondary | ICD-10-CM | POA: Insufficient documentation

## 2012-08-22 ENCOUNTER — Ambulatory Visit: Payer: BC Managed Care – PPO | Admitting: Physical Therapy

## 2012-08-27 ENCOUNTER — Encounter: Payer: BC Managed Care – PPO | Admitting: Physical Therapy

## 2012-08-29 ENCOUNTER — Ambulatory Visit: Payer: BC Managed Care – PPO | Admitting: *Deleted

## 2012-09-03 ENCOUNTER — Ambulatory Visit: Payer: BC Managed Care – PPO | Admitting: *Deleted

## 2012-09-05 ENCOUNTER — Encounter: Payer: BC Managed Care – PPO | Admitting: *Deleted

## 2012-09-11 ENCOUNTER — Other Ambulatory Visit: Payer: BC Managed Care – PPO

## 2012-09-19 ENCOUNTER — Ambulatory Visit: Payer: BC Managed Care – PPO | Admitting: Family Medicine

## 2012-09-24 ENCOUNTER — Other Ambulatory Visit: Payer: Self-pay | Admitting: Family Medicine

## 2012-09-24 ENCOUNTER — Other Ambulatory Visit (INDEPENDENT_AMBULATORY_CARE_PROVIDER_SITE_OTHER): Payer: BC Managed Care – PPO

## 2012-09-24 DIAGNOSIS — E785 Hyperlipidemia, unspecified: Secondary | ICD-10-CM

## 2012-09-24 DIAGNOSIS — I10 Essential (primary) hypertension: Secondary | ICD-10-CM

## 2012-09-24 DIAGNOSIS — IMO0002 Reserved for concepts with insufficient information to code with codable children: Secondary | ICD-10-CM

## 2012-09-24 DIAGNOSIS — I214 Non-ST elevation (NSTEMI) myocardial infarction: Secondary | ICD-10-CM

## 2012-09-24 DIAGNOSIS — M1710 Unilateral primary osteoarthritis, unspecified knee: Secondary | ICD-10-CM

## 2012-09-25 NOTE — Progress Notes (Signed)
Patient came in for labs only.

## 2012-09-26 LAB — CMP14+EGFR
ALT: 22 IU/L (ref 0–44)
AST: 17 IU/L (ref 0–40)
Albumin/Globulin Ratio: 2 (ref 1.1–2.5)
Albumin: 4.4 g/dL (ref 3.5–5.5)
Alkaline Phosphatase: 73 IU/L (ref 39–117)
BUN/Creatinine Ratio: 14 (ref 9–20)
BUN: 13 mg/dL (ref 6–24)
CO2: 23 mmol/L (ref 18–29)
Calcium: 9.3 mg/dL (ref 8.7–10.2)
Chloride: 102 mmol/L (ref 97–108)
Creatinine, Ser: 0.9 mg/dL (ref 0.76–1.27)
GFR calc Af Amer: 112 mL/min/{1.73_m2} (ref >59)
GFR calc non Af Amer: 97 mL/min/{1.73_m2} (ref >59)
Globulin, Total: 2.2 g/dL (ref 1.5–4.5)
Glucose: 86 mg/dL (ref 65–99)
Potassium: 4.5 mmol/L (ref 3.5–5.2)
Sodium: 141 mmol/L (ref 134–144)
Total Bilirubin: 0.6 mg/dL (ref 0.0–1.2)
Total Protein: 6.6 g/dL (ref 6.0–8.5)

## 2012-09-26 LAB — NMR, LIPOPROFILE
Cholesterol: 197 mg/dL (ref <200)
HDL Cholesterol by NMR: 45 mg/dL (ref >=40)
HDL Particle Number: 34 umol/L (ref >=30.5)
LDL Particle Number: 1486 nmol/L — ABNORMAL HIGH (ref <1000)
LDL Size: 20.9 nm (ref >20.5)
LDLC SERPL CALC-MCNC: 116 mg/dL — ABNORMAL HIGH (ref <100)
LP-IR Score: 83 — ABNORMAL HIGH (ref <=45)
Small LDL Particle Number: 372 nmol/L (ref <=527)
Triglycerides by NMR: 182 mg/dL — ABNORMAL HIGH (ref <150)

## 2012-10-01 ENCOUNTER — Ambulatory Visit (INDEPENDENT_AMBULATORY_CARE_PROVIDER_SITE_OTHER): Payer: BC Managed Care – PPO | Admitting: Family Medicine

## 2012-10-01 ENCOUNTER — Encounter: Payer: Self-pay | Admitting: Family Medicine

## 2012-10-01 VITALS — BP 131/81 | HR 68 | Temp 97.1°F | Wt 233.8 lb

## 2012-10-01 DIAGNOSIS — I10 Essential (primary) hypertension: Secondary | ICD-10-CM

## 2012-10-01 DIAGNOSIS — E663 Overweight: Secondary | ICD-10-CM | POA: Insufficient documentation

## 2012-10-01 DIAGNOSIS — I214 Non-ST elevation (NSTEMI) myocardial infarction: Secondary | ICD-10-CM

## 2012-10-01 DIAGNOSIS — E785 Hyperlipidemia, unspecified: Secondary | ICD-10-CM

## 2012-10-01 NOTE — Progress Notes (Signed)
Patient ID: [name]., male   DOB: 06/21/1960, 52 y.o.   MRN: [id number] SUBJECTIVE: CC: Chief Complaint  Patient presents with  . Follow-up    3 month   discuss labs   . Medication Refill    refill bp med    HPI: Patient is here for follow up of hyperlipidemia/HTN/CAD: denies Headache;denies Chest Pain;denies weakness;denies Shortness of Breath and orthopnea;denies Visual changes;denies palpitations;denies cough;denies pedal edema;denies symptoms of TIA or stroke;deniesClaudication symptoms. admits to Compliance with medications; denies Problems with medications.  Past Medical History  Diagnosis Date  . Hypertension     on meds for 8 years  . Hyperlipemia    Past Surgical History  Procedure Laterality Date  . Joint replacement    . Rt knee replacement  2008  . Knee arthroscopy    . Knee arthroscopy with patellar tendon repair    . Cardiovascular stress test    . Total knee arthroplasty  02/16/2012    Procedure: TOTAL KNEE ARTHROPLASTY;  Surgeon: Verlee Rossetti, MD;  Location: [facility] OR;  Service: Orthopedics;  Laterality: Left;   History   Social History  . Marital Status: Married    Spouse Name: N/A    Number of Children: N/A  . Years of Education: N/A   Occupational History  . Not on file.   Social History Main Topics  . Smoking status: Former Smoker -- 0.50 packs/day for 2 years    Types: Cigarettes  . Smokeless tobacco: Not on file  . Alcohol Use: No  . Drug Use: No  . Sexual Activity: Not on file   Other Topics Concern  . Not on file   Social History Narrative  . No narrative on file   No family history on file. Current Outpatient Prescriptions on File Prior to Visit  Medication Sig Dispense Refill  . amLODipine-benazepril (LOTREL) 5-10 MG per capsule TAKE ONE CAPSULE BY MOUTH EVERY DAY  30 capsule  5  . aspirin EC 81 MG tablet Take 81 mg by mouth daily.      . calcium citrate-vitamin D (CITRACAL+D) 315-200 MG-UNIT per tablet Take 1 tablet by  mouth 2 (two) times daily.      . metoprolol succinate (TOPROL-XL) 25 MG 24 hr tablet TAKE ONE TABLET BY MOUTH EVERY DAY  30 tablet  5  . atorvastatin (LIPITOR) 20 MG tablet Take 1 tablet (20 mg total) by mouth daily.  90 tablet  3   No current facility-administered medications on file prior to visit.   No Known Allergies  There is no immunization history on file for this patient. Prior to Admission medications   Medication Sig Start Date End Date Taking? Authorizing Provider  amLODipine-benazepril (LOTREL) 5-10 MG per capsule TAKE ONE CAPSULE BY MOUTH EVERY DAY 05/30/12  Yes Ileana Ladd, MD  aspirin EC 81 MG tablet Take 81 mg by mouth daily.   Yes Historical Provider, MD  calcium citrate-vitamin D (CITRACAL+D) 315-200 MG-UNIT per tablet Take 1 tablet by mouth 2 (two) times daily.   Yes Historical Provider, MD  metoprolol succinate (TOPROL-XL) 25 MG 24 hr tablet TAKE ONE TABLET BY MOUTH EVERY DAY 05/30/12  Yes Ileana Ladd, MD  niacin (SLO-NIACIN) 500 MG tablet Take 500 mg by mouth at bedtime.   Yes Historical Provider, MD  atorvastatin (LIPITOR) 20 MG tablet Take 1 tablet (20 mg total) by mouth daily. 06/08/12   Ileana Ladd, MD     ROS: As above in the HPI.  All other systems are stable or negative.  OBJECTIVE: APPEARANCE:  Patient in no acute distress.The patient appeared well nourished and normally developed. Acyanotic. Waist: VITAL SIGNS:BP 131/81  Pulse 68  Temp(Src) 97.1 F (36.2 C) (Oral)  Wt 233 lb 12.8 oz (106.051 kg)  BMI 31.7 kg/m2 WM Muscular with central adiposity  SKIN: warm and  Dry without overt rashes, tattoos and scars  HEAD and Neck: without JVD, Head and scalp: normal Eyes:No scleral icterus. Fundi normal, eye movements normal. Ears: Auricle normal, canal normal, Tympanic membranes normal, insufflation normal. Nose: normal Throat: normal Neck & thyroid: normal  CHEST & LUNGS: Chest wall: normal Lungs: Clear  CVS: Reveals the PMI to be  normally located. Regular rhythm, First and Second Heart sounds are normal,  absence of murmurs, rubs or gallops. Peripheral vasculature: Radial pulses: normal Dorsal pedis pulses: normal Posterior pulses: normal  ABDOMEN:  Appearance: central adeposity Benign, no organomegaly, no masses, no Abdominal Aortic enlargement. No Guarding , no rebound. No Bruits. Bowel sounds: normal  RECTAL: N/A GU: N/A  EXTREMETIES: nonedematous.  MUSCULOSKELETAL:  Spine: reduced ROM secondary to recent Disc herniation treated by Neurosurgeon. Joints: intact  NEUROLOGIC: oriented to time,place and person; nonfocal. Strength is normal Sensory is normal Reflexes are normal Cranial Nerves are normal. Results for orders placed in visit on 09/24/12  CMP14+EGFR      Result Value Range   Glucose 86  65 - 99 mg/dL   BUN 13  6 - 24 mg/dL   Creatinine, Ser 1.61  0.76 - 1.27 mg/dL   GFR calc non Af Amer 97  >59 mL/min/1.73   GFR calc Af Amer 112  >59 mL/min/1.73   BUN/Creatinine Ratio 14  9 - 20   Sodium 141  134 - 144 mmol/L   Potassium 4.5  3.5 - 5.2 mmol/L   Chloride 102  97 - 108 mmol/L   CO2 23  18 - 29 mmol/L   Calcium 9.3  8.7 - 10.2 mg/dL   Total Protein 6.6  6.0 - 8.5 g/dL   Albumin 4.4  3.5 - 5.5 g/dL   Globulin, Total 2.2  1.5 - 4.5 g/dL   Albumin/Globulin Ratio 2.0  1.1 - 2.5   Total Bilirubin 0.6  0.0 - 1.2 mg/dL   Alkaline Phosphatase 73  39 - 117 IU/L   AST 17  0 - 40 IU/L   ALT 22  0 - 44 IU/L  NMR, LIPOPROFILE      Result Value Range   LDL Particle Number 1486 (*) <1000 nmol/L   LDLC SERPL CALC-MCNC 116 (*) <100 mg/dL   HDL Cholesterol by NMR 45  >=40 mg/dL   Triglycerides by NMR 182 (*) <150 mg/dL   Cholesterol 096  <045 mg/dL   HDL Particle Number 40.9  >=81.1 umol/L   Small LDL Particle Number 372  <=527 nmol/L   LDL Size 20.9  >20.5 nm   LP-IR Score 83 (*) <=45    ASSESSMENT: HYPERLIPIDEMIA-MIXED  HYPERTENSION, UNSPECIFIED  ACUT MI SUBENDOCARDIAL INFARCT  SUBSQT EPIS CARE  Overweight    PLAN:      Dr Woodroe Mode Recommendations  For nutrition information, I recommend books:  1).Eat to Live by Dr Monico Hoar. 2).Prevent and Reverse Heart Disease by Dr Suzzette Righter. 3) Dr Katherina Right Book:  Program to Reverse Diabetes  Exercise recommendations are:  If unable to walk, then the patient can exercise in a chair 3 times a day. By flapping arms like a bird gently  and raising legs outwards to the front.  If ambulatory, the patient can go for walks for 30 minutes 3 times a week. Then increase the intensity and duration as tolerated.  Goal is to try to attain exercise frequency to 5 times a week.  If applicable: Best to perform resistance exercises (machines or weights) 2 days a week and cardio type exercises 3 days per week.  Emphasized a plant based diet.  Return in about 3 months (around 12/31/2012) for Recheck medical problems. and labs.  Andjela Wickes P. Modesto Charon, M.D.

## 2012-10-01 NOTE — Patient Instructions (Addendum)
      Dr Caprice Mccaffrey's Recommendations  For nutrition information, I recommend books:  1).Eat to Live by Dr Joel Fuhrman. 2).Prevent and Reverse Heart Disease by Dr Caldwell Esselstyn. 3) Dr Neal Barnard's Book:  Program to Reverse Diabetes  Exercise recommendations are:  If unable to walk, then the patient can exercise in a chair 3 times a day. By flapping arms like a bird gently and raising legs outwards to the front.  If ambulatory, the patient can go for walks for 30 minutes 3 times a week. Then increase the intensity and duration as tolerated.  Goal is to try to attain exercise frequency to 5 times a week.  If applicable: Best to perform resistance exercises (machines or weights) 2 days a week and cardio type exercises 3 days per week.  

## 2012-10-10 ENCOUNTER — Other Ambulatory Visit: Payer: Self-pay | Admitting: Neurosurgery

## 2012-10-10 DIAGNOSIS — M545 Low back pain, unspecified: Secondary | ICD-10-CM

## 2012-10-15 ENCOUNTER — Ambulatory Visit
Admission: RE | Admit: 2012-10-15 | Discharge: 2012-10-15 | Disposition: A | Discharge status: Home / Self Care | Payer: BC Managed Care – PPO | Source: Ambulatory Visit | Attending: Neurosurgery | Admitting: Neurosurgery

## 2012-10-15 DIAGNOSIS — M545 Low back pain, unspecified: Secondary | ICD-10-CM

## 2012-10-15 MED ORDER — IOHEXOL 180 MG/ML  SOLN
1.0000 mL | Freq: Once | INTRAMUSCULAR | Status: AC | PRN
  Administered 2012-10-15: 1 mL via EPIDURAL

## 2012-10-15 MED ORDER — METHYLPREDNISOLONE ACETATE 40 MG/ML INJ SUSP (RADIOLOG
120.0000 mg | Freq: Once | INTRAMUSCULAR | Status: AC
  Administered 2012-10-15: 120 mg via EPIDURAL

## 2012-11-21 ENCOUNTER — Other Ambulatory Visit: Payer: Self-pay

## 2012-11-24 ENCOUNTER — Other Ambulatory Visit: Payer: Self-pay | Admitting: Family Medicine

## 2012-12-09 ENCOUNTER — Telehealth: Payer: Self-pay

## 2012-12-09 NOTE — Telephone Encounter (Signed)
Dr Glee Arvin called reporting pt had a laminectomy this am and he forgot to put the concentration on his pain med and is calling to see if Dr Modesto Charon will write percocet 5/325 for only two day til wife can drive to his office to pick up.      Pt notiifed and stated Dr Glee Arvin was going to call something in for tonight and his wife is not home and would have to pick rx up in the am.

## 2012-12-10 NOTE — Telephone Encounter (Signed)
Spoke with pt and he verbalizes Dr Glee Arvin got his rx corrected and wife picked up in [place]

## 2012-12-24 ENCOUNTER — Ambulatory Visit: Payer: BC Managed Care – PPO | Admitting: Physical Therapy

## 2012-12-25 ENCOUNTER — Ambulatory Visit: Payer: BC Managed Care – PPO | Admitting: Physical Therapy

## 2013-01-03 ENCOUNTER — Ambulatory Visit: Payer: BC Managed Care – PPO | Admitting: Family Medicine

## 2013-01-24 ENCOUNTER — Ambulatory Visit: Payer: BC Managed Care – PPO | Admitting: Family Medicine

## 2013-01-27 ENCOUNTER — Encounter: Payer: Self-pay | Admitting: Family Medicine

## 2013-01-27 ENCOUNTER — Ambulatory Visit (INDEPENDENT_AMBULATORY_CARE_PROVIDER_SITE_OTHER): Payer: BC Managed Care – PPO | Admitting: Family Medicine

## 2013-01-27 VITALS — BP 124/80 | HR 75 | Temp 98.3°F | Ht 72.0 in | Wt 235.6 lb

## 2013-01-27 DIAGNOSIS — D1739 Benign lipomatous neoplasm of skin and subcutaneous tissue of other sites: Secondary | ICD-10-CM

## 2013-01-27 DIAGNOSIS — IMO0002 Reserved for concepts with insufficient information to code with codable children: Secondary | ICD-10-CM

## 2013-01-27 DIAGNOSIS — I214 Non-ST elevation (NSTEMI) myocardial infarction: Secondary | ICD-10-CM

## 2013-01-27 DIAGNOSIS — E785 Hyperlipidemia, unspecified: Secondary | ICD-10-CM

## 2013-01-27 DIAGNOSIS — D171 Benign lipomatous neoplasm of skin and subcutaneous tissue of trunk: Secondary | ICD-10-CM

## 2013-01-27 DIAGNOSIS — E663 Overweight: Secondary | ICD-10-CM

## 2013-01-27 DIAGNOSIS — I10 Essential (primary) hypertension: Secondary | ICD-10-CM

## 2013-01-27 DIAGNOSIS — M1710 Unilateral primary osteoarthritis, unspecified knee: Secondary | ICD-10-CM

## 2013-01-27 NOTE — Patient Instructions (Signed)
Coronary Artery Disease, Risk Factors Research has shown that the risk of developing coronary artery disease (CAD) and having a heart attack increases with each factor you have. RISK FACTORS YOU CANNOT CHANGE  Your age. Your risk goes up as you get older. Most heart attacks happen to people over the age of 24.  Gender. Men have a greater risk of heart attack than women, and they have attacks earlier in life. However, women are more likely to die from a heart attack.  Heredity. Children of parents with heart disease are more likely to develop it themselves.  Race. African Americans and other ethnic groups have a higher risk, possibly because of high blood pressure, a tendency toward obesity, and diabetes.  Your family. Most people with a strong family history of heart disease have one or more other risk factors. RISK FACTORS YOU CAN CHANGE  Exposure to tobacco smoke. Even secondhand smoke greatly increases the risk for heart disease.  High blood cholesterol may be lowered with changes in diet, activity, and medicines.  High blood pressure makes the heart work harder. This causes the heart muscles to become thick and, eventually, weaker. It also increases your risk of stroke, heart attack, and kidney or heart failure.  Physical inactivity is a risk factor for CAD. Regular physical activity helps prevent heart and blood vessel disease. Exercise helps control blood cholesterol, diabetes, obesity, and it may help lower blood pressure in some people.  Excess body fat, especially belly fat, increases the risk of heart disease and stroke even if there are no other risk factors. Excess weight increases the heart's workload and raises blood pressure and blood cholesterol.  Diabetes seriously increases your risk of developing CAD. If you have diabetes, you should work with your caregiver to manage it and control other risk factors. OTHER RISK FACTORS FOR CAD  How you respond to stress.  Drinking  too much alcohol may raise blood pressure, cause heart failure, and lead to stroke.  Total cholesterol greater than 200 milligrams.  HDL (good) cholesterol less than 40 milligrams. HDL helps keep cholesterol from building up in the walls of the arteries. PREVENTING CAD  Maintain a healthy weight.  Exercise or do physical activity.  Eat a heart-healthy diet low in fat and salt and high in fiber.  Control your blood pressure to keep it below 120 over 80.  Keep your cholesterol at a level that lowers your risk.  Manage diabetes if you have it.  Stop smoking.  Learn how to manage stress. HEART SMART SUBSTITUTIONS  Instead of whole or 2% milk and cream, use skim milk.  Instead of fried foods, eat baked, steamed, boiled, broiled, or microwaved foods.  Instead of lard, butter, palm and coconut oils, cook with unsaturated vegetable oils, such as corn, olive, canola, safflower, sesame, soybean, sunflower, or peanut.  Instead of fatty cuts of meat, eat lean cuts of meat or cut off the fatty parts.  Instead of 1 whole egg in recipes, use 2 egg whites.  Instead of sauces, butter, and salt, season vegetables with herbs and spices.  Instead of regular hard and processed cheeses, eat low-fat, low-sodium cheeses.  Instead of salted potato chips, choose low-fat, unsalted tortilla and potato chips and unsalted pretzels and popcorn.  Instead of sour cream and mayonnaise, use plain low-fat yogurt, low-fat cottage cheese, or low-fat or "light" sour cream. FOR MORE INFORMATION  National Heart Lung and Blood Institute: http://www.ball-ray.net/ American Heart Association: weddingcandid.com Document Released: 03/25/2003 Document Revised: 03/27/2011  Document Reviewed: 03/20/2007 [facility] Patient Information 2014 [place].        Dr Paula Libra Recommendations  For nutrition information, I recommend books:  1).Eat to Live by Dr Excell Seltzer. 2).Prevent and Reverse  Heart Disease by Dr Karl Luke. 3) Dr Janene Harvey Book:  Program to Reverse Diabetes  Exercise recommendations are:  If unable to walk, then the patient can exercise in a chair 3 times a day. By flapping arms like a bird gently and raising legs outwards to the front.  If ambulatory, the patient can go for walks for 30 minutes 3 times a week. Then increase the intensity and duration as tolerated.  Goal is to try to attain exercise frequency to 5 times a week.  If applicable: Best to perform resistance exercises (machines or weights) 2 days a week and cardio type exercises 3 days per week.

## 2013-01-27 NOTE — Progress Notes (Signed)
Patient ID: Nicholas Auerbach., male   DOB: March 13, 1960, 53 y.o.   MRN: [id number] SUBJECTIVE: CC: Chief Complaint  Patient presents with  . Follow-up    3 month ck up no complaints refill bp meds     HPI: Patient is here for follow up of hyperlipidemia/CAD/HTN: denies Headache;denies Chest Pain;denies weakness;denies Shortness of Breath and orthopnea;denies Visual changes;denies palpitations;denies cough;denies pedal edema;denies symptoms of TIA or stroke;deniesClaudication symptoms. admits to Compliance with medications; denies Problems with medications.  Breakfast: egg and oatmeal Lunch: a lot of chicken Supper: depends.  Retired from one job. Starting a new one   Past Medical History  Diagnosis Date  . Hypertension     on meds for 8 years  . Hyperlipemia    Past Surgical History  Procedure Laterality Date  . Joint replacement    . Rt knee replacement  2008  . Knee arthroscopy    . Knee arthroscopy with patellar tendon repair    . Cardiovascular stress test    . Total knee arthroplasty  02/16/2012    Procedure: TOTAL KNEE ARTHROPLASTY;  Surgeon: Augustin Schooling, MD;  Location: [place];  Service: Orthopedics;  Laterality: Left;   History   Social History  . Marital Status: Married    Spouse Name: N/A    Number of Children: N/A  . Years of Education: N/A   Occupational History  . Not on file.   Social History Main Topics  . Smoking status: Former Smoker -- 0.50 packs/day for 2 years    Types: Cigarettes  . Smokeless tobacco: Not on file  . Alcohol Use: No  . Drug Use: No  . Sexual Activity: Not on file   Other Topics Concern  . Not on file   Social History Narrative  . No narrative on file   No family history on file. Current Outpatient Prescriptions on File Prior to Visit  Medication Sig Dispense Refill  . amLODipine-benazepril (LOTREL) 5-10 MG per capsule TAKE ONE CAPSULE BY MOUTH ONCE DAILY  30 capsule  3  . aspirin EC 81 MG tablet Take 81 mg by mouth  daily.      . calcium citrate-vitamin D (CITRACAL+D) 315-200 MG-UNIT per tablet Take 1 tablet by mouth 2 (two) times daily.      . metoprolol succinate (TOPROL-XL) 25 MG 24 hr tablet TAKE ONE TABLET BY MOUTH ONCE DAILY  30 tablet  3  . niacin (SLO-NIACIN) 500 MG tablet Take 2 tablets (1,000 mg total) by mouth at bedtime.      Marland Kitchen atorvastatin (LIPITOR) 20 MG tablet Take 1 tablet (20 mg total) by mouth daily.  90 tablet  3   No current facility-administered medications on file prior to visit.   No Known Allergies  There is no immunization history on file for this patient. Prior to Admission medications   Medication Sig Start Date End Date Taking? Authorizing Provider  amLODipine-benazepril (LOTREL) 5-10 MG per capsule TAKE ONE CAPSULE BY MOUTH ONCE DAILY 11/24/12  Yes Vernie Shanks, MD  aspirin EC 81 MG tablet Take 81 mg by mouth daily.   Yes Historical Provider, MD  calcium citrate-vitamin D (CITRACAL+D) 315-200 MG-UNIT per tablet Take 1 tablet by mouth 2 (two) times daily.   Yes Historical Provider, MD  metoprolol succinate (TOPROL-XL) 25 MG 24 hr tablet TAKE ONE TABLET BY MOUTH ONCE DAILY 11/24/12  Yes Vernie Shanks, MD  niacin (SLO-NIACIN) 500 MG tablet Take 2 tablets (1,000 mg total) by mouth at  bedtime. 10/01/12  Yes Vernie Shanks, MD  atorvastatin (LIPITOR) 20 MG tablet Take 1 tablet (20 mg total) by mouth daily. 06/08/12   Vernie Shanks, MD     ROS: As above in the HPI. All other systems are stable or negative.  OBJECTIVE: APPEARANCE:  Patient in no acute distress.The patient appeared well nourished and normally developed. Acyanotic. Waist: VITAL SIGNS:BP 124/80  Pulse 75  Temp(Src) 98.3 F (36.8 C) (Oral)  Ht 6' (1.829 m)  Wt 235 lb 9.6 oz (106.867 kg)  BMI 31.95 kg/m2 Obese WM weight up. Recheck BP 124/80  SKIN: warm and  Dry without overt rashes, tattoos and scars  HEAD and Neck: without JVD, Head and scalp: normal Eyes:No scleral icterus. Fundi normal, eye  movements normal. Ears: Auricle normal, canal normal, Tympanic membranes normal, insufflation normal. Nose: normal Throat: normal Neck & thyroid: normal  CHEST & LUNGS: Chest wall: normal Lungs: Clear  CVS: Reveals the PMI to be normally located. Regular rhythm, First and Second Heart sounds are normal,  absence of murmurs, rubs or gallops. Peripheral vasculature: Radial pulses: normal Dorsal pedis pulses: normal Posterior pulses: normal  ABDOMEN:  Appearance: normal Benign, no organomegaly, no masses, no Abdominal Aortic enlargement. No Guarding , no rebound. No Bruits. Bowel sounds: normal  RECTAL: N/A GU: N/A  EXTREMETIES: nonedematous.  MUSCULOSKELETAL:  Spine: normal Joints: intact  NEUROLOGIC: oriented to time,place and person; nonfocal. Strength is normal Sensory is normal Reflexes are normal Cranial Nerves are normal.  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  PLAN:      Dr Paula Libra Recommendations  For nutrition information, I recommend books:  1).Eat to Live by Dr Excell Seltzer. 2).Prevent and Reverse Heart Disease by Dr Karl Luke. 3) Dr Janene Harvey Book:  Program to Reverse Diabetes  Exercise recommendations are:  If unable to walk, then the patient can exercise in a chair 3 times a day. By flapping arms like a bird gently and raising legs outwards to the front.  If ambulatory, the patient can go for walks for 30 minutes 3 times a week. Then increase the intensity and duration as tolerated.  Goal is to try to attain exercise frequency to 5 times a week.  If applicable: Best to perform resistance exercises (machines or  weights) 2 days a week and cardio type exercises 3 days per week.  Orders Placed This Encounter  Procedures  . CMP14+EGFR  . NMR, lipoprofile   No orders of the defined types were placed in this encounter.   There are no discontinued medications. Return in about 3 months (around 04/27/2013) for Recheck medical problems.  [name] P. Jacelyn Grip, M.D.

## 2013-01-29 LAB — CMP14+EGFR
ALT: 25 IU/L (ref 0–44)
AST: 31 IU/L (ref 0–40)
Albumin/Globulin Ratio: 2 (ref 1.1–2.5)
Albumin: 4.6 g/dL (ref 3.5–5.5)
Alkaline Phosphatase: 80 IU/L (ref 39–117)
BUN/Creatinine Ratio: 12 (ref 9–20)
BUN: 10 mg/dL (ref 6–24)
CO2: 22 mmol/L (ref 18–29)
Calcium: 9.4 mg/dL (ref 8.7–10.2)
Chloride: 100 mmol/L (ref 97–108)
Creatinine, Ser: 0.83 mg/dL (ref 0.76–1.27)
GFR calc Af Amer: 116 mL/min/{1.73_m2} (ref >59)
GFR calc non Af Amer: 100 mL/min/{1.73_m2} (ref >59)
Globulin, Total: 2.3 g/dL (ref 1.5–4.5)
Glucose: 101 mg/dL — ABNORMAL HIGH (ref 65–99)
Potassium: 4.6 mmol/L (ref 3.5–5.2)
Sodium: 139 mmol/L (ref 134–144)
Total Bilirubin: 1.1 mg/dL (ref 0.0–1.2)
Total Protein: 6.9 g/dL (ref 6.0–8.5)

## 2013-01-29 LAB — NMR, LIPOPROFILE
Cholesterol: 190 mg/dL (ref <200)
HDL Cholesterol by NMR: 40 mg/dL (ref >=40)
HDL Particle Number: 30.2 umol/L — ABNORMAL LOW (ref >=30.5)
LDL Particle Number: 1434 nmol/L — ABNORMAL HIGH (ref <1000)
LDL Size: 20.6 nm (ref >20.5)
LDLC SERPL CALC-MCNC: 113 mg/dL — ABNORMAL HIGH (ref <100)
LP-IR Score: 49 — ABNORMAL HIGH (ref <=45)
Small LDL Particle Number: 612 nmol/L — ABNORMAL HIGH (ref <=527)
Triglycerides by NMR: 186 mg/dL — ABNORMAL HIGH (ref <150)

## 2013-01-30 ENCOUNTER — Other Ambulatory Visit: Payer: Self-pay | Admitting: Family Medicine

## 2013-01-30 DIAGNOSIS — E785 Hyperlipidemia, unspecified: Secondary | ICD-10-CM

## 2013-01-30 MED ORDER — ATORVASTATIN CALCIUM 40 MG PO TABS: 40.0000 mg | ORAL_TABLET | Freq: Every day | ORAL | Status: DC

## 2013-03-22 ENCOUNTER — Other Ambulatory Visit: Payer: Self-pay | Admitting: Family Medicine

## 2013-04-29 ENCOUNTER — Encounter: Payer: Self-pay | Admitting: Family Medicine

## 2013-04-29 ENCOUNTER — Ambulatory Visit (INDEPENDENT_AMBULATORY_CARE_PROVIDER_SITE_OTHER): Payer: BC Managed Care – PPO | Admitting: Family Medicine

## 2013-04-29 VITALS — BP 139/81 | HR 58 | Temp 97.6°F | Ht 72.0 in | Wt 225.4 lb

## 2013-04-29 DIAGNOSIS — E785 Hyperlipidemia, unspecified: Secondary | ICD-10-CM

## 2013-04-29 DIAGNOSIS — M1710 Unilateral primary osteoarthritis, unspecified knee: Secondary | ICD-10-CM

## 2013-04-29 DIAGNOSIS — D1739 Benign lipomatous neoplasm of skin and subcutaneous tissue of other sites: Secondary | ICD-10-CM

## 2013-04-29 DIAGNOSIS — I214 Non-ST elevation (NSTEMI) myocardial infarction: Secondary | ICD-10-CM

## 2013-04-29 DIAGNOSIS — I10 Essential (primary) hypertension: Secondary | ICD-10-CM

## 2013-04-29 DIAGNOSIS — IMO0002 Reserved for concepts with insufficient information to code with codable children: Secondary | ICD-10-CM

## 2013-04-29 DIAGNOSIS — D171 Benign lipomatous neoplasm of skin and subcutaneous tissue of trunk: Secondary | ICD-10-CM

## 2013-04-29 NOTE — Patient Instructions (Addendum)
    Dr Berklee Battey's Recommendations  For nutrition information, I recommend books:  1).Eat to Live by Dr Joel Fuhrman. 2).Prevent and Reverse Heart Disease by Dr Caldwell Esselstyn. 3) Dr Neal Barnard's Book:  Program to Reverse Diabetes  Exercise recommendations are:  If unable to walk, then the patient can exercise in a chair 3 times a day. By flapping arms like a bird gently and raising legs outwards to the front.  If ambulatory, the patient can go for walks for 30 minutes 3 times a week. Then increase the intensity and duration as tolerated.  Goal is to try to attain exercise frequency to 5 times a week.  If applicable: Best to perform resistance exercises (machines or weights) 2 days a week and cardio type exercises 3 days per week.  DASH Diet The DASH diet stands for "Dietary Approaches to Stop Hypertension." It is a healthy eating plan that has been shown to reduce high blood pressure (hypertension) in as little as 14 days, while also possibly providing other significant health benefits. These other health benefits include reducing the risk of breast cancer after menopause and reducing the risk of type 2 diabetes, heart disease, colon cancer, and stroke. Health benefits also include weight loss and slowing kidney failure in patients with chronic kidney disease.  DIET GUIDELINES  Limit salt (sodium). Your diet should contain less than 1500 mg of sodium daily.  Limit refined or processed carbohydrates. Your diet should include mostly whole grains. Desserts and added sugars should be used sparingly.  Include small amounts of heart-healthy fats. These types of fats include nuts, oils, and tub margarine. Limit saturated and trans fats. These fats have been shown to be harmful in the body. CHOOSING FOODS  The following food groups are based on a 2000 calorie diet. See your Registered Dietitian for individual calorie needs. Grains and Grain Products (6 to 8 servings daily)  Eat More  Often: Whole-wheat bread, brown rice, whole-grain or wheat pasta, quinoa, popcorn without added fat or salt (air popped).  Eat Less Often: White bread, white pasta, white rice, cornbread. Vegetables (4 to 5 servings daily)  Eat More Often: Fresh, frozen, and canned vegetables. Vegetables may be raw, steamed, roasted, or grilled with a minimal amount of fat.  Eat Less Often/Avoid: Creamed or fried vegetables. Vegetables in a cheese sauce. Fruit (4 to 5 servings daily)  Eat More Often: All fresh, canned (in natural juice), or frozen fruits. Dried fruits without added sugar. One hundred percent fruit juice ( cup [237 mL] daily).  Eat Less Often: Dried fruits with added sugar. Canned fruit in light or heavy syrup. Lean Meats, Fish, and Poultry (2 servings or less daily. One serving is 3 to 4 oz [85-114 g]).  Eat More Often: Ninety percent or leaner ground beef, tenderloin, sirloin. Round cuts of beef, chicken breast, turkey breast. All fish. Grill, bake, or broil your meat. Nothing should be fried.  Eat Less Often/Avoid: Fatty cuts of meat, turkey, or chicken leg, thigh, or wing. Fried cuts of meat or fish. Dairy (2 to 3 servings)  Eat More Often: Low-fat or fat-free milk, low-fat plain or light yogurt, reduced-fat or part-skim cheese.  Eat Less Often/Avoid: Milk (whole, 2%).Whole milk yogurt. Full-fat cheeses. Nuts, Seeds, and Legumes (4 to 5 servings per week)  Eat More Often: All without added salt.  Eat Less Often/Avoid: Salted nuts and seeds, canned beans with added salt. Fats and Sweets (limited)  Eat More Often: Vegetable oils, tub margarines   without trans fats, sugar-free gelatin. Mayonnaise and salad dressings.  Eat Less Often/Avoid: Coconut oils, palm oils, butter, stick margarine, cream, half and half, cookies, candy, pie. FOR MORE INFORMATION The Dash Diet Eating Plan: www.dashdiet.org Document Released: 12/22/2010 Document Revised: 03/27/2011 Document Reviewed:  12/22/2010 [employer] Patient Information 2014 [employer].  

## 2013-04-29 NOTE — Progress Notes (Signed)
Patient ID: [name]., male   DOB: 09-10-1960, 53 y.o.   MRN: [id number] SUBJECTIVE: CC: Chief Complaint  Patient presents with  . Follow-up    3 month follow up     HPI: Patient is here for follow up of hyperlipidemia/HTN/CAD/overweight: denies Headache;denies Chest Pain;denies weakness;denies Shortness of Breath and orthopnea;denies Visual changes;denies palpitations;denies cough;denies pedal edema;denies symptoms of TIA or stroke;deniesClaudication symptoms. admits to Compliance with medications; denies Problems with medications.    Past Medical History  Diagnosis Date  . Hypertension     on meds for 8 years  . Hyperlipemia    Past Surgical History  Procedure Laterality Date  . Joint replacement    . Rt knee replacement  2008  . Knee arthroscopy    . Knee arthroscopy with patellar tendon repair    . Cardiovascular stress test    . Total knee arthroplasty  02/16/2012    Procedure: TOTAL KNEE ARTHROPLASTY;  Surgeon: Augustin Schooling, MD;  Location: [facility];  Service: Orthopedics;  Laterality: Left;   History   Social History  . Marital Status: Married    Spouse Name: N/A    Number of Children: N/A  . Years of Education: N/A   Occupational History  . Not on file.   Social History Main Topics  . Smoking status: Former Smoker -- 0.50 packs/day for 2 years    Types: Cigarettes  . Smokeless tobacco: Not on file  . Alcohol Use: No  . Drug Use: No  . Sexual Activity: Not on file   Other Topics Concern  . Not on file   Social History Narrative  . No narrative on file   No family history on file. Current Outpatient Prescriptions on File Prior to Visit  Medication Sig Dispense Refill  . amLODipine-benazepril (LOTREL) 5-10 MG per capsule TAKE ONE CAPSULE BY MOUTH ONCE DAILY  30 capsule  3  . aspirin EC 81 MG tablet Take 81 mg by mouth daily.      . calcium citrate-vitamin D (CITRACAL+D) 315-200 MG-UNIT per tablet Take 1 tablet by mouth 2 (two) times daily.       . metoprolol succinate (TOPROL-XL) 25 MG 24 hr tablet TAKE ONE TABLET BY MOUTH ONCE DAILY  30 tablet  3  . niacin (SLO-NIACIN) 500 MG tablet Take 2 tablets (1,000 mg total) by mouth at bedtime.      Marland Kitchen atorvastatin (LIPITOR) 40 MG tablet Take 1 tablet (40 mg total) by mouth daily.  90 tablet  3   No current facility-administered medications on file prior to visit.   No Known Allergies  There is no immunization history on file for this patient. Prior to Admission medications   Medication Sig Start Date End Date Taking? Authorizing Provider  amLODipine-benazepril (LOTREL) 5-10 MG per capsule TAKE ONE CAPSULE BY MOUTH ONCE DAILY   Yes Vernie Shanks, MD  aspirin EC 81 MG tablet Take 81 mg by mouth daily.   Yes Historical Provider, MD  calcium citrate-vitamin D (CITRACAL+D) 315-200 MG-UNIT per tablet Take 1 tablet by mouth 2 (two) times daily.   Yes Historical Provider, MD  metoprolol succinate (TOPROL-XL) 25 MG 24 hr tablet TAKE ONE TABLET BY MOUTH ONCE DAILY   Yes Vernie Shanks, MD  niacin (SLO-NIACIN) 500 MG tablet Take 2 tablets (1,000 mg total) by mouth at bedtime. 10/01/12  Yes Vernie Shanks, MD  atorvastatin (LIPITOR) 40 MG tablet Take 1 tablet (40 mg total) by mouth daily. 01/30/13  Vernie Shanks, MD     ROS: As above in the HPI. All other systems are stable or negative.  OBJECTIVE: APPEARANCE:  Patient in no acute distress.The patient appeared well nourished and normally developed. Acyanotic. Waist: VITAL SIGNS:BP 139/81  Pulse 58  Temp(Src) 97.6 F (36.4 C) (Oral)  Ht 6' (1.829 m)  Wt 225 lb 6.4 oz (102.241 kg)  BMI 30.56 kg/m2  WM borderline obese.central fat but muscular SKIN: warm and  Dry without overt rashes, tattoos and scars  HEAD and Neck: without JVD, Head and scalp: normal Eyes:No scleral icterus. Fundi normal, eye movements normal. Ears: Auricle normal, canal normal, Tympanic membranes normal, insufflation normal. Nose: normal Throat: normal Neck &  thyroid: normal  CHEST & LUNGS: Chest wall: normal Lungs: Clear  CVS: Reveals the PMI to be normally located. Regular rhythm, First and Second Heart sounds are normal,  absence of murmurs, rubs or gallops. Peripheral vasculature: Radial pulses: normal Dorsal pedis pulses: normal Posterior pulses: normal  ABDOMEN:  Appearance:Obese Benign, no organomegaly, no masses, no Abdominal Aortic enlargement. No Guarding , no rebound. No Bruits. Bowel sounds: normal  RECTAL: N/A GU: N/A  EXTREMETIES: nonedematous.  MUSCULOSKELETAL:  Spine: normal Joints: intact  NEUROLOGIC: oriented to time,place and person; nonfocal. Strength is normal Sensory is normal Reflexes are normal Cranial Nerves are normal. Results for orders placed in visit on 01/27/13  CMP14+EGFR      Result Value Ref Range   Glucose 101 (*) 65 - 99 mg/dL   BUN 10  6 - 24 mg/dL   Creatinine, Ser 0.83  0.76 - 1.27 mg/dL   GFR calc non Af Amer 100  >59 mL/min/1.73   GFR calc Af Amer 116  >59 mL/min/1.73   BUN/Creatinine Ratio 12  9 - 20   Sodium 139  134 - 144 mmol/L   Potassium 4.6  3.5 - 5.2 mmol/L   Chloride 100  97 - 108 mmol/L   CO2 22  18 - 29 mmol/L   Calcium 9.4  8.7 - 10.2 mg/dL   Total Protein 6.9  6.0 - 8.5 g/dL   Albumin 4.6  3.5 - 5.5 g/dL   Globulin, Total 2.3  1.5 - 4.5 g/dL   Albumin/Globulin Ratio 2.0  1.1 - 2.5   Total Bilirubin 1.1  0.0 - 1.2 mg/dL   Alkaline Phosphatase 80  39 - 117 IU/L   AST 31  0 - 40 IU/L   ALT 25  0 - 44 IU/L  NMR, LIPOPROFILE      Result Value Ref Range   LDL Particle Number 1434 (*) <1000 nmol/L   LDLC SERPL CALC-MCNC 113 (*) <100 mg/dL   HDL Cholesterol by NMR 40  >=40 mg/dL   Triglycerides by NMR 186 (*) <150 mg/dL   Cholesterol 190  <200 mg/dL   HDL Particle Number 30.2 (*) >=30.5 umol/L   Small LDL Particle Number 612 (*) <=527 nmol/L   LDL Size 20.6  >20.5 nm   LP-IR Score 49 (*) <=45    ASSESSMENT: HYPERTENSION, UNSPECIFIED - Plan:  CMP14+EGFR  HYPERLIPIDEMIA-MIXED - Plan: CMP14+EGFR, NMR, lipoprofile  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  PLAN: DASH diet in the AVS       Dr Paula Libra Recommendations  For nutrition information, I recommend books:  1).Eat to Live by Dr Excell Seltzer. 2).Prevent and Reverse  Heart Disease by Dr Karl Luke. 3) Dr Janene Harvey Book:  Program to Reverse Diabetes  Exercise recommendations are:  If unable to walk, then the patient can exercise in a chair 3 times a day. By flapping arms like a bird gently and raising legs outwards to the front.  If ambulatory, the patient can go for walks for 30 minutes 3 times a week. Then increase the intensity and duration as tolerated.  Goal is to try to attain exercise frequency to 5 times a week.  If applicable: Best to perform resistance exercises (machines or weights) 2 days a week and cardio type exercises 3 days per week.   Reviewed the healthy LTC patient needs to make. Attain ideal weight and continued regular exercise and  Dietary changes.  Orders Placed This Encounter  Procedures  . CMP14+EGFR  . NMR, lipoprofile   No orders of the defined types were placed in this encounter.   There are no discontinued medications. Return in about 3 months (around 07/29/2013) for Recheck medical problems.  Jayel Inks P. Jacelyn Grip, M.D.

## 2013-04-30 LAB — NMR, LIPOPROFILE
Cholesterol: 193 mg/dL (ref <200)
HDL Cholesterol by NMR: 31 mg/dL — ABNORMAL LOW (ref >=40)
HDL Particle Number: 27.5 umol/L — ABNORMAL LOW (ref >=30.5)
LDL Particle Number: 1440 nmol/L — ABNORMAL HIGH (ref <1000)
LDL Size: 20.6 nm (ref >20.5)
LDLC SERPL CALC-MCNC: 95 mg/dL (ref <100)
LP-IR Score: 85 — ABNORMAL HIGH (ref <=45)
Small LDL Particle Number: 594 nmol/L — ABNORMAL HIGH (ref <=527)
Triglycerides by NMR: 335 mg/dL — ABNORMAL HIGH (ref <150)

## 2013-04-30 LAB — CMP14+EGFR
ALT: 25 IU/L (ref 0–44)
AST: 23 IU/L (ref 0–40)
Albumin/Globulin Ratio: 2.2 (ref 1.1–2.5)
Albumin: 4.7 g/dL (ref 3.5–5.5)
Alkaline Phosphatase: 86 IU/L (ref 39–117)
BUN/Creatinine Ratio: 19 (ref 9–20)
BUN: 16 mg/dL (ref 6–24)
CO2: 22 mmol/L (ref 18–29)
Calcium: 9.3 mg/dL (ref 8.7–10.2)
Chloride: 103 mmol/L (ref 97–108)
Creatinine, Ser: 0.83 mg/dL (ref 0.76–1.27)
GFR calc Af Amer: 116 mL/min/{1.73_m2} (ref >59)
GFR calc non Af Amer: 100 mL/min/{1.73_m2} (ref >59)
Globulin, Total: 2.1 g/dL (ref 1.5–4.5)
Glucose: 92 mg/dL (ref 65–99)
Potassium: 4.2 mmol/L (ref 3.5–5.2)
Sodium: 140 mmol/L (ref 134–144)
Total Bilirubin: 0.3 mg/dL (ref 0.0–1.2)
Total Protein: 6.8 g/dL (ref 6.0–8.5)

## 2013-07-24 ENCOUNTER — Other Ambulatory Visit: Payer: Self-pay | Admitting: *Deleted

## 2013-07-24 MED ORDER — AMLODIPINE BESY-BENAZEPRIL HCL 5-10 MG PO CAPS: ORAL_CAPSULE | ORAL | Status: DC

## 2013-07-24 MED ORDER — METOPROLOL SUCCINATE ER 25 MG PO TB24: ORAL_TABLET | ORAL | Status: DC

## 2013-11-26 ENCOUNTER — Other Ambulatory Visit: Payer: Self-pay | Admitting: Family Medicine

## 2014-01-02 ENCOUNTER — Encounter: Payer: Self-pay | Admitting: Family Medicine

## 2014-01-02 ENCOUNTER — Ambulatory Visit (INDEPENDENT_AMBULATORY_CARE_PROVIDER_SITE_OTHER): Payer: BC Managed Care – PPO | Admitting: Family Medicine

## 2014-01-02 VITALS — BP 141/82 | HR 74 | Temp 97.2°F | Ht 72.0 in | Wt 248.0 lb

## 2014-01-02 DIAGNOSIS — E785 Hyperlipidemia, unspecified: Secondary | ICD-10-CM

## 2014-01-02 DIAGNOSIS — I10 Essential (primary) hypertension: Secondary | ICD-10-CM

## 2014-01-02 MED ORDER — METOPROLOL SUCCINATE ER 25 MG PO TB24: 25.0000 mg | ORAL_TABLET | Freq: Every day | ORAL | Status: DC

## 2014-01-02 MED ORDER — AMLODIPINE BESY-BENAZEPRIL HCL 5-10 MG PO CAPS: 1.0000 | ORAL_CAPSULE | Freq: Every day | ORAL | Status: DC

## 2014-01-02 NOTE — Progress Notes (Signed)
   Subjective:    Patient ID: [name]., male    DOB: 1960/04/28, 53 y.o.   MRN: [id number]  HPI  53 year old gentleman who is here to follow-up his blood pressure and lipids. In reviewing his history he has had bilateral knee replacements at a young age. He was also hospitalized in the past for what was thought to be subendocardial infarction but with clear coronary arteries. He was placed on atorvastatin but has not taken it.    Review of Systems  Constitutional: Positive for unexpected weight change.  HENT: Negative.   Eyes: Negative.   Respiratory: Negative.  Negative for shortness of breath.   Cardiovascular: Negative.  Negative for chest pain and leg swelling.  Gastrointestinal: Negative.   Genitourinary: Negative.   Musculoskeletal: Negative.   Skin: Negative.   Neurological: Negative.   Psychiatric/Behavioral: Negative.   All other systems reviewed and are negative.      Objective:   Physical Exam  Constitutional: He is oriented to person, place, and time. He appears well-developed and well-nourished.  HENT:  Head: Normocephalic.  Right Ear: External ear normal.  Left Ear: External ear normal.  Nose: Nose normal.  Mouth/Throat: Oropharynx is clear and moist.  Eyes: Conjunctivae and EOM are normal. Pupils are equal, round, and reactive to light.  Neck: Normal range of motion. Neck supple.  Cardiovascular: Normal rate, regular rhythm, normal heart sounds and intact distal pulses.   Pulmonary/Chest: Effort normal and breath sounds normal.  Abdominal: Soft. Bowel sounds are normal.  Musculoskeletal: Normal range of motion.  Neurological: He is alert and oriented to person, place, and time.  Skin: Skin is warm and dry.  Psychiatric: He has a normal mood and affect. His behavior is normal. Judgment and thought content normal.    BP 141/82 mmHg  Pulse 74  Temp(Src) 97.2 F (36.2 C) (Oral)  Ht 6' (1.829 m)  Wt 248 lb (112.492 kg)  BMI 33.63 kg/m2        Assessment & Plan:  1. Essential hypertension  - Lipid panel; Future - CMP14+EGFR; Future  2. Hyperlipidemia Encourage weight loss - Lipid panel; Future - CMP14+EGFR; Future  Wardell Honour MD

## 2014-01-08 ENCOUNTER — Other Ambulatory Visit (INDEPENDENT_AMBULATORY_CARE_PROVIDER_SITE_OTHER): Payer: BC Managed Care – PPO

## 2014-01-08 DIAGNOSIS — E785 Hyperlipidemia, unspecified: Secondary | ICD-10-CM

## 2014-01-08 DIAGNOSIS — R739 Hyperglycemia, unspecified: Secondary | ICD-10-CM

## 2014-01-08 DIAGNOSIS — I10 Essential (primary) hypertension: Secondary | ICD-10-CM

## 2014-01-22 NOTE — Addendum Note (Signed)
Addended by: Earlene Plater on: 01/22/2014 03:20 PM   Modules accepted: Orders

## 2014-01-22 NOTE — Addendum Note (Signed)
Addended by: Earlene Plater on: 01/22/2014 04:09 PM   Modules accepted: Orders

## 2014-01-22 NOTE — Addendum Note (Signed)
Addended by: Earlene Plater on: 01/22/2014 04:10 PM   Modules accepted: Orders

## 2014-01-23 ENCOUNTER — Other Ambulatory Visit: Payer: BC Managed Care – PPO

## 2014-01-23 NOTE — Progress Notes (Signed)
Lab only 

## 2014-01-23 NOTE — Addendum Note (Signed)
Addended by: Pollyann Kennedy F on: 01/23/2014 08:03 AM   Modules accepted: Orders

## 2014-01-24 LAB — CMP14+EGFR
ALT: 41 IU/L (ref 0–44)
AST: 32 IU/L (ref 0–40)
Albumin/Globulin Ratio: 2.2 (ref 1.1–2.5)
Albumin: 4.6 g/dL (ref 3.5–5.5)
Alkaline Phosphatase: 74 IU/L (ref 39–117)
BUN/Creatinine Ratio: 12 (ref 9–20)
BUN: 12 mg/dL (ref 6–24)
CO2: 25 mmol/L (ref 18–29)
Calcium: 9.3 mg/dL (ref 8.7–10.2)
Chloride: 101 mmol/L (ref 97–108)
Creatinine, Ser: 0.97 mg/dL (ref 0.76–1.27)
GFR calc Af Amer: 102 mL/min/{1.73_m2} (ref >59)
GFR calc non Af Amer: 88 mL/min/{1.73_m2} (ref >59)
Globulin, Total: 2.1 g/dL (ref 1.5–4.5)
Glucose: 91 mg/dL (ref 65–99)
Potassium: 4.4 mmol/L (ref 3.5–5.2)
Sodium: 142 mmol/L (ref 134–144)
Total Bilirubin: 0.7 mg/dL (ref 0.0–1.2)
Total Protein: 6.7 g/dL (ref 6.0–8.5)

## 2014-01-24 LAB — LIPID PANEL
Chol/HDL Ratio: 5.8 ratio units — ABNORMAL HIGH (ref 0.0–5.0)
Cholesterol, Total: 203 mg/dL — ABNORMAL HIGH (ref 100–199)
HDL: 35 mg/dL — ABNORMAL LOW (ref >39)
LDL Calculated: 124 mg/dL — ABNORMAL HIGH (ref 0–99)
Triglycerides: 222 mg/dL — ABNORMAL HIGH (ref 0–149)
VLDL Cholesterol Cal: 44 mg/dL — ABNORMAL HIGH (ref 5–40)

## 2014-01-27 ENCOUNTER — Telehealth: Payer: Self-pay | Admitting: Family Medicine

## 2014-01-27 LAB — POCT GLYCOSYLATED HEMOGLOBIN (HGB A1C): Hemoglobin A1C: 5.3

## 2014-01-27 NOTE — Addendum Note (Signed)
Addended by: Selmer Dominion on: 01/27/2014 11:43 AM   Modules accepted: Orders

## 2014-01-27 NOTE — Telephone Encounter (Signed)
Pt's wife aware that Dr Sabra Heck will review and we will call back with recommendations

## 2014-01-27 NOTE — Telephone Encounter (Signed)
Aware, labs need to be reviewed by provider and then we can call with results.

## 2014-01-30 ENCOUNTER — Other Ambulatory Visit: Payer: Self-pay | Admitting: Family Medicine

## 2014-01-30 MED ORDER — METOPROLOL SUCCINATE ER 25 MG PO TB24: 25.0000 mg | ORAL_TABLET | Freq: Every day | ORAL | Status: DC

## 2014-01-30 MED ORDER — AMLODIPINE BESY-BENAZEPRIL HCL 5-10 MG PO CAPS: 1.0000 | ORAL_CAPSULE | Freq: Every day | ORAL | Status: DC

## 2014-01-30 NOTE — Telephone Encounter (Signed)
Done, LM on Julies machine

## 2014-10-08 ENCOUNTER — Ambulatory Visit (INDEPENDENT_AMBULATORY_CARE_PROVIDER_SITE_OTHER): Payer: BC Managed Care – PPO | Admitting: Nurse Practitioner

## 2014-10-08 ENCOUNTER — Encounter: Payer: Self-pay | Admitting: Nurse Practitioner

## 2014-10-08 VITALS — BP 147/90 | HR 72 | Temp 97.1°F | Ht 72.0 in | Wt 245.0 lb

## 2014-10-08 DIAGNOSIS — T2009XA Burn of unspecified degree of multiple sites of head, face, and neck, initial encounter: Secondary | ICD-10-CM | POA: Diagnosis not present

## 2014-10-08 DIAGNOSIS — T23022A Burn of unspecified degree of single left finger (nail) except thumb, initial encounter: Secondary | ICD-10-CM

## 2014-10-08 DIAGNOSIS — T300 Burn of unspecified body region, unspecified degree: Secondary | ICD-10-CM

## 2014-10-08 MED ORDER — SILVER SULFADIAZINE 1 % EX CREA: 1.0000 "application " | TOPICAL_CREAM | Freq: Every day | CUTANEOUS | Status: DC

## 2014-10-08 MED ORDER — HYDROCODONE-ACETAMINOPHEN 10-325 MG PO TABS: 1.0000 | ORAL_TABLET | Freq: Three times a day (TID) | ORAL | Status: DC | PRN
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
## 2014-10-08 NOTE — Patient Instructions (Signed)
Burn Care Your skin is a natural barrier to infection. It is the largest organ of your body. Burns damage this natural protection. To help prevent infection, it is very important to follow your caregiver's instructions in the care of your burn. Burns are classified as:  First degree. There is only redness of the skin (erythema). No scarring is expected.  Second degree. There is blistering of the skin. Scarring may occur with deeper burns.  Third degree. All layers of the skin are injured, and scarring is expected. HOME CARE INSTRUCTIONS   Wash your hands well before changing your bandage.  Change your bandage as often as directed by your caregiver.  Remove the old bandage. If the bandage sticks, you may soak it off with cool, clean water.  Cleanse the burn thoroughly but gently with mild soap and water.  Pat the area dry with a clean, dry cloth.  Apply a thin layer of antibacterial cream to the burn.  Apply a clean bandage as instructed by your caregiver.  Keep the bandage as clean and dry as possible.  Elevate the affected area for the first 24 hours, then as instructed by your caregiver.  Only take over-the-counter or prescription medicines for pain, discomfort, or fever as directed by your caregiver. SEEK IMMEDIATE MEDICAL CARE IF:   You develop excessive pain.  You develop redness, tenderness, swelling, or red streaks near the burn.  The burned area develops yellowish-white fluid (pus) or a bad smell.  You have a fever. MAKE SURE YOU:   Understand these instructions.  Will watch your condition.  Will get help right away if you are not doing well or get worse. Document Released: 01/02/2005 Document Revised: 03/27/2011 Document Reviewed: 05/25/2010 ExitCare Patient Information 2015 [employer]. This information is not intended to replace advice given to you by your health care provider. Make sure you discuss any questions you have with your health care  provider.  

## 2014-10-18 ENCOUNTER — Emergency Department (HOSPITAL_BASED_OUTPATIENT_CLINIC_OR_DEPARTMENT_OTHER)
Admission: EM | Admit: 2014-10-18 | Discharge: 2014-10-18 | Disposition: A | Discharge status: Home / Self Care | Payer: BC Managed Care – PPO | Attending: Emergency Medicine | Admitting: Emergency Medicine

## 2014-10-18 ENCOUNTER — Encounter (HOSPITAL_BASED_OUTPATIENT_CLINIC_OR_DEPARTMENT_OTHER): Payer: Self-pay | Admitting: Emergency Medicine

## 2014-10-18 DIAGNOSIS — I10 Essential (primary) hypertension: Secondary | ICD-10-CM | POA: Insufficient documentation

## 2014-10-18 DIAGNOSIS — Z87891 Personal history of nicotine dependence: Secondary | ICD-10-CM | POA: Diagnosis not present

## 2014-10-18 DIAGNOSIS — Z8639 Personal history of other endocrine, nutritional and metabolic disease: Secondary | ICD-10-CM | POA: Insufficient documentation

## 2014-10-18 DIAGNOSIS — Z79899 Other long term (current) drug therapy: Secondary | ICD-10-CM | POA: Diagnosis not present

## 2014-10-18 DIAGNOSIS — Z7982 Long term (current) use of aspirin: Secondary | ICD-10-CM | POA: Insufficient documentation

## 2014-10-18 DIAGNOSIS — Z7952 Long term (current) use of systemic steroids: Secondary | ICD-10-CM | POA: Insufficient documentation

## 2014-10-18 DIAGNOSIS — M7989 Other specified soft tissue disorders: Secondary | ICD-10-CM | POA: Diagnosis present

## 2014-10-18 MED ORDER — PREDNISONE 20 MG PO TABS: 60.0000 mg | ORAL_TABLET | Freq: Every day | ORAL | Status: DC

## 2014-10-18 MED ORDER — PREDNISONE 50 MG PO TABS
60.0000 mg | ORAL_TABLET | Freq: Once | ORAL | Status: AC
  Administered 2014-10-18: 10:00:00 60 mg via ORAL
  Filled 2014-10-18 (×2): qty 1

## 2014-10-18 NOTE — ED Notes (Signed)
Pt states awoke in the night with hands tingling and swollen.  Pt hands visibly swollen and pt left 4th finger swollen with tight ring on, unable to remove at home.

## 2014-10-18 NOTE — ED Provider Notes (Signed)
CSN: [id number]     Arrival date & time 10/18/14  6226 History   First MD Initiated Contact with Patient 10/18/14 [phone]     Chief Complaint  Patient presents with  . Hand Problem     (Consider location/radiation/quality/duration/timing/severity/associated sxs/prior Treatment) HPI Comments: Bilateral swollen hands since 0100 this AM. Initially started with paresthesias. No h/o same. No trauma, rash or other complaint. No aggravating/relieving factors.    Past Medical History  Diagnosis Date  . Hypertension     on meds for 8 years  . Hyperlipemia    Past Surgical History  Procedure Laterality Date  . Joint replacement    . Rt knee replacement  2008  . Knee arthroscopy    . Knee arthroscopy with patellar tendon repair    . Cardiovascular stress test    . Total knee arthroplasty  02/16/2012    Procedure: TOTAL KNEE ARTHROPLASTY;  Surgeon: Augustin Schooling, MD;  Location: [facility];  Service: Orthopedics;  Laterality: Left;  . Back surgery     No family history on file. Social History  Substance Use Topics  . Smoking status: Former Smoker -- 0.50 packs/day for 2 years    Types: Cigarettes    Quit date: 01/17/1983  . Smokeless tobacco: None  . Alcohol Use: No    Review of Systems  Constitutional: Negative for fever and chills.  Eyes: Negative for pain.  Respiratory: Negative for cough, shortness of breath and wheezing.   Cardiovascular: Negative for chest pain and palpitations.  Gastrointestinal: Negative for nausea and vomiting.  Musculoskeletal:       Bilateral hand swelling  All other systems reviewed and are negative.     Allergies  Review of patient's allergies indicates no known allergies.  Home Medications   Prior to Admission medications   Medication Sig Start Date End Date Taking? Authorizing Provider  amLODipine-benazepril (LOTREL) 5-10 MG per capsule Take 1 capsule by mouth daily. 01/30/14   Wardell Honour, MD  aspirin EC 81 MG tablet Take 81 mg by mouth  daily.    Historical Provider, MD  calcium citrate-vitamin D (CITRACAL+D) 315-200 MG-UNIT per tablet Take 1 tablet by mouth 2 (two) times daily.    Historical Provider, MD  HYDROcodone-acetaminophen (NORCO) 10-325 MG per tablet Take 1 tablet by mouth every 8 (eight) hours as needed. 10/08/14   Mary-Margaret Hassell Done, FNP  metoprolol succinate (TOPROL-XL) 25 MG 24 hr tablet Take 1 tablet (25 mg total) by mouth daily. 01/30/14   Wardell Honour, MD  predniSONE (DELTASONE) 20 MG tablet Take 3 tablets (60 mg total) by mouth daily with breakfast. 10/19/14   Merrily Pew, MD  silver sulfADIAZINE (SILVADENE) 1 % cream Apply 1 application topically daily. 10/08/14   Mary-Margaret Hassell Done, FNP   BP 142/89 mmHg  Pulse 71  Temp(Src) 98.2 F (36.8 C) (Oral)  Resp 16  Ht 6' (1.829 m)  Wt 230 lb (104.327 kg)  BMI 31.19 kg/m2  SpO2 95% Physical Exam  Constitutional: He is oriented to person, place, and time. He appears well-developed and well-nourished.  HENT:  Head: Normocephalic and atraumatic.  Eyes: Conjunctivae and EOM are normal.  Neck: Normal range of motion. Neck supple.  Cardiovascular: Normal rate and regular rhythm.   Pulmonary/Chest: Effort normal. No respiratory distress.  Abdominal: Soft. There is no tenderness.  Musculoskeletal: Normal range of motion. He exhibits no edema or tenderness.  Neurological: He is alert and oriented to person, place, and time.  Skin: Skin is warm and  dry.  Non pitting edema to bilateral hands with significant restriction on left fourth finger 2/2 wedding ring  Nursing note and vitals reviewed.   ED Course  Procedures (including critical care time) Labs Review Labs Reviewed - No data to display  Imaging Review No results found. I have personally reviewed and evaluated these images and lab results as part of my medical decision-making.   EKG Interpretation None      MDM   Final diagnoses:  Swelling of both hands   Bilateral hand swelling, unknown  origin. Ring cut off. Will give steroids and observe.  I show the cause of his symptoms. Possible allergic reaction. After couple hours of observation has slightly improved. Will follow-up with his doctor tomorrow. We'll put him on steroid burst in the meantime. Patient wanted to go home. I wanted to keep him for another hour or 2 of observation however he really wanted to leave and said he would come back with any new or worsening symptoms. Still no apparent distress without any evidence of airway or systemic involvement  I have personally and contemperaneously reviewed labs and imaging and used in my decision making as above.   A medical screening exam was performed and I feel the patient has had an appropriate workup for their chief complaint at this time and likelihood of emergent condition existing is low. They have been counseled on decision, discharge, follow up and which symptoms necessitate immediate return to the emergency department. They or their family verbally stated understanding and agreement with plan and discharged in stable condition.       Merrily Pew, MD 10/18/14 1105

## 2015-01-23 ENCOUNTER — Other Ambulatory Visit: Payer: Self-pay | Admitting: Family Medicine

## 2015-01-28 ENCOUNTER — Telehealth: Payer: Self-pay | Admitting: Family Medicine

## 2015-01-28 ENCOUNTER — Other Ambulatory Visit: Payer: Self-pay | Admitting: Family Medicine

## 2015-01-28 NOTE — Telephone Encounter (Signed)
Done earlier by [employer]

## 2015-02-01 ENCOUNTER — Ambulatory Visit (INDEPENDENT_AMBULATORY_CARE_PROVIDER_SITE_OTHER): Payer: BC Managed Care – PPO | Admitting: Family Medicine

## 2015-02-01 ENCOUNTER — Encounter: Payer: Self-pay | Admitting: Family Medicine

## 2015-02-01 VITALS — BP 132/77 | HR 75 | Temp 97.1°F | Ht 73.83 in | Wt 258.6 lb

## 2015-02-01 DIAGNOSIS — E669 Obesity, unspecified: Secondary | ICD-10-CM | POA: Diagnosis not present

## 2015-02-01 DIAGNOSIS — E785 Hyperlipidemia, unspecified: Secondary | ICD-10-CM | POA: Diagnosis not present

## 2015-02-01 DIAGNOSIS — Z0000 Encounter for general adult medical examination without abnormal findings: Secondary | ICD-10-CM | POA: Insufficient documentation

## 2015-02-01 DIAGNOSIS — I214 Non-ST elevation (NSTEMI) myocardial infarction: Secondary | ICD-10-CM | POA: Diagnosis not present

## 2015-02-01 DIAGNOSIS — I10 Essential (primary) hypertension: Secondary | ICD-10-CM

## 2015-02-01 MED ORDER — METOPROLOL SUCCINATE ER 25 MG PO TB24: 25.0000 mg | ORAL_TABLET | Freq: Every day | ORAL | Status: DC

## 2015-02-01 MED ORDER — AMLODIPINE BESY-BENAZEPRIL HCL 5-10 MG PO CAPS: 1.0000 | ORAL_CAPSULE | Freq: Every day | ORAL | Status: DC

## 2015-02-01 NOTE — Patient Instructions (Signed)
Great to meet you!  Continue the daily aspirin  Set up an appointment for fasting labs  We will consider cholesterol medicines  Come back in 1 year unless you need [place] sooner.

## 2015-02-01 NOTE — Progress Notes (Signed)
   HPI  Patient presents today here for medication refill.  Patient explains that he has good medication compliance. No headaches, chest pain, palpitations, leg edema. He's retired from the [employer], now he works with [employer].  He describes events of chest pain in 2011 which she is unsure of it was a heart attack or not. He does explain that he had elevated troponins at the time and had a clean cath. A daily aspirin since.  PMH: Smoking status noted ROS: Per HPI  Objective: BP 132/77 mmHg  Pulse 75  Temp(Src) 97.1 F (36.2 C) (Oral)  Ht 6' 1.83" (1.875 m)  Wt 258 lb 9.6 oz (117.3 kg)  BMI 33.37 kg/m2 Gen: NAD, alert, cooperative with exam HEENT: NCAT, TMs normal bilaterally, nares clear CV: RRR, good S1/S2, no murmur Resp: CTABL, no wheezes, non-labored Abd: SNTND, BS present, no guarding or organomegaly Ext: No edema, warm Neuro: Alert and oriented, No gross deficits  Assessment and plan:  # Hypertension Controlled Refill medications Fasting labs ordered  # History of myocardial infarction, NSTEMI In 2008 he had elevated troponins to 16.8 Daily aspirin, metoprolol Began a discussion about cholesterol medications today Check fasting lipid panel  # Obesity, BMI 33 Discussed diet and exercise His weight appears appropriate for his build   HCM- HCV ordered   Orders Placed This Encounter  Procedures  . CMP14+EGFR    Standing Status: Future     Number of Occurrences:      Standing Expiration Date: 02/01/2016  . CBC    Standing Status: Future     Number of Occurrences:      Standing Expiration Date: 02/01/2016  . Lipid panel    Standing Status: Future     Number of Occurrences:      Standing Expiration Date: 02/01/2016  . TSH + free T4    Standing Status: Future     Number of Occurrences:      Standing Expiration Date: 02/01/2016  . Hepatitis C Antibody    Standing Status: Future     Number of Occurrences:      Standing Expiration Date: 02/01/2016      Meds ordered this encounter  Medications  . amLODipine-benazepril (LOTREL) 5-10 MG capsule    Sig: Take 1 capsule by mouth daily.    Dispense:  90 capsule    Refill:  3  . metoprolol succinate (TOPROL-XL) 25 MG 24 hr tablet    Sig: Take 1 tablet (25 mg total) by mouth daily.    Dispense:  90 tablet    Refill:  [place], MD [place] 02/01/2015, 5:21 PM

## 2015-02-02 ENCOUNTER — Other Ambulatory Visit: Payer: BC Managed Care – PPO

## 2015-02-02 DIAGNOSIS — Z0000 Encounter for general adult medical examination without abnormal findings: Secondary | ICD-10-CM

## 2015-02-02 DIAGNOSIS — I10 Essential (primary) hypertension: Secondary | ICD-10-CM

## 2015-02-02 DIAGNOSIS — E785 Hyperlipidemia, unspecified: Secondary | ICD-10-CM

## 2015-02-03 LAB — CBC
Hematocrit: 48.3 % (ref 37.5–51.0)
Hemoglobin: 16 g/dL (ref 12.6–17.7)
MCH: 30.2 pg (ref 26.6–33.0)
MCHC: 33.1 g/dL (ref 31.5–35.7)
MCV: 91 fL (ref 79–97)
Platelets: 208 10*3/uL (ref 150–379)
RBC: 5.3 x10E6/uL (ref 4.14–5.80)
RDW: 13.5 % (ref 12.3–15.4)
WBC: 6.8 10*3/uL (ref 3.4–10.8)

## 2015-02-03 LAB — LIPID PANEL
Chol/HDL Ratio: 5.8 ratio units — ABNORMAL HIGH (ref 0.0–5.0)
Cholesterol, Total: 216 mg/dL — ABNORMAL HIGH (ref 100–199)
HDL: 37 mg/dL — ABNORMAL LOW (ref >39)
LDL Calculated: 137 mg/dL — ABNORMAL HIGH (ref 0–99)
Triglycerides: 208 mg/dL — ABNORMAL HIGH (ref 0–149)
VLDL Cholesterol Cal: 42 mg/dL — ABNORMAL HIGH (ref 5–40)

## 2015-02-03 LAB — CMP14+EGFR
ALT: 43 IU/L (ref 0–44)
AST: 29 IU/L (ref 0–40)
Albumin/Globulin Ratio: 2 (ref 1.1–2.5)
Albumin: 4.5 g/dL (ref 3.5–5.5)
Alkaline Phosphatase: 79 IU/L (ref 39–117)
BUN/Creatinine Ratio: 16 (ref 9–20)
BUN: 15 mg/dL (ref 6–24)
Bilirubin Total: 0.5 mg/dL (ref 0.0–1.2)
CO2: 22 mmol/L (ref 18–29)
Calcium: 9.3 mg/dL (ref 8.7–10.2)
Chloride: 100 mmol/L (ref 96–106)
Creatinine, Ser: 0.93 mg/dL (ref 0.76–1.27)
GFR calc Af Amer: 106 mL/min/{1.73_m2} (ref >59)
GFR calc non Af Amer: 92 mL/min/{1.73_m2} (ref >59)
Globulin, Total: 2.2 g/dL (ref 1.5–4.5)
Glucose: 88 mg/dL (ref 65–99)
Potassium: 4.5 mmol/L (ref 3.5–5.2)
Sodium: 140 mmol/L (ref 134–144)
Total Protein: 6.7 g/dL (ref 6.0–8.5)

## 2015-02-03 LAB — HEPATITIS C ANTIBODY: Hep C Virus Ab: <0.1 {s_co_ratio} (ref 0.0–0.9)

## 2015-02-03 LAB — TSH+FREE T4
Free T4: 1.09 ng/dL (ref 0.82–1.77)
TSH: 2.82 u[IU]/mL (ref 0.450–4.500)

## 2015-03-04 ENCOUNTER — Ambulatory Visit (INDEPENDENT_AMBULATORY_CARE_PROVIDER_SITE_OTHER): Payer: BC Managed Care – PPO | Admitting: Family Medicine

## 2015-03-04 ENCOUNTER — Encounter: Payer: Self-pay | Admitting: Family Medicine

## 2015-03-04 VITALS — BP 119/77 | HR 68 | Temp 97.2°F | Ht 73.83 in | Wt 242.8 lb

## 2015-03-04 DIAGNOSIS — J101 Influenza due to other identified influenza virus with other respiratory manifestations: Secondary | ICD-10-CM | POA: Diagnosis not present

## 2015-03-04 DIAGNOSIS — R52 Pain, unspecified: Secondary | ICD-10-CM

## 2015-03-04 LAB — POCT INFLUENZA A/B
Influenza A, POC: POSITIVE — AB
Influenza B, POC: NEGATIVE

## 2015-03-04 MED ORDER — HYDROCODONE-HOMATROPINE 5-1.5 MG/5ML PO SYRP: 5.0000 mL | ORAL_SOLUTION | Freq: Four times a day (QID) | ORAL | Status: DC | PRN

## 2015-03-04 NOTE — Patient Instructions (Signed)
Great to see you again  Influenza, Adult Influenza ("the flu") is a viral infection of the respiratory tract. It occurs more often in winter months because people spend more time in close contact with one another. Influenza can make you feel very sick. Influenza easily spreads from person to person (contagious). CAUSES  Influenza is caused by a virus that infects the respiratory tract. You can catch the virus by breathing in droplets from an infected person's cough or sneeze. You can also catch the virus by touching something that was recently contaminated with the virus and then touching your mouth, nose, or eyes. RISKS AND COMPLICATIONS You may be at risk for a more severe case of influenza if you smoke cigarettes, have diabetes, have chronic heart disease (such as heart failure) or lung disease (such as asthma), or if you have a weakened immune system. Elderly people and pregnant women are also at risk for more serious infections. The most common problem of influenza is a lung infection (pneumonia). Sometimes, this problem can require emergency medical care and may be life threatening. SIGNS AND SYMPTOMS  Symptoms typically last 4 to 10 days and may include:  Fever.  Chills.  Headache, body aches, and muscle aches.  Sore throat.  Chest discomfort and cough.  Poor appetite.  Weakness or feeling tired.  Dizziness.  Nausea or vomiting. DIAGNOSIS  Diagnosis of influenza is often made based on your history and a physical exam. A nose or throat swab test can be done to confirm the diagnosis. TREATMENT  In mild cases, influenza goes away on its own. Treatment is directed at relieving symptoms. For more severe cases, your health care provider may prescribe antiviral medicines to shorten the sickness. Antibiotic medicines are not effective because the infection is caused by a virus, not by bacteria. HOME CARE INSTRUCTIONS  Take medicines only as directed by your health care  provider.  Use a cool mist humidifier to make breathing easier.  Get plenty of rest until your temperature returns to normal. This usually takes 3 to 4 days.  Drink enough fluid to keep your urine clear or pale yellow.  Cover yourmouth and nosewhen coughing or sneezing,and wash your handswellto prevent thevirusfrom spreading.  Stay homefromwork orschool untilthe fever is gonefor at least 27full day. PREVENTION  An annual influenza vaccination (flu shot) is the best way to avoid getting influenza. An annual flu shot is now routinely recommended for all adults in the [place] IF:  You experiencechest pain, yourcough worsens,or you producemore mucus.  Youhave nausea,vomiting, ordiarrhea.  Your fever returns or gets worse. SEEK IMMEDIATE MEDICAL CARE IF:  You havetrouble breathing, you become short of breath,or your skin ornails becomebluish.  You have severe painor stiffnessin the neck.  You develop a sudden headache, or pain in the face or ear.  You have nausea or vomiting that you cannot control. MAKE SURE YOU:   Understand these instructions.  Will watch your condition.  Will get help right away if you are not doing well or get worse.   This information is not intended to replace advice given to you by your health care provider. Make sure you discuss any questions you have with your health care provider.   Document Released: 12/31/1999 Document Revised: 01/23/2014 Document Reviewed: 04/03/2011 Elsevier Interactive Patient Education [employer].

## 2015-03-04 NOTE — Progress Notes (Signed)
   HPI  Patient presents today flulike illness.  Patient's explains hes had 4 days of cough, headache, body aches, nasal congestion. He denies any shortness of breath or chest pain. He is tolerating foods include easily, he did have emesis 4 days ago but has not repeated itself  PMH: Smoking status noted ROS: Per HPI  Objective: BP 119/77 mmHg  Pulse 68  Temp(Src) 97.2 F (36.2 C) (Oral)  Ht 6' 1.83" (1.875 m)  Wt 242 lb 12.8 oz (110.133 kg)  BMI 31.33 kg/m2 Gen: NAD, alert, cooperative with exam HEENT: NCAT, TMs WNL, NAres with erythema and swelling, oropharynx clear CV: RRR, good S1/S2, no murmur Resp: CTABL, no wheezes, non-labored Ext: No edema, warm Neuro: Alert and oriented, No gross deficits  Assessment and plan:  # nfluenza Day 4 of illness, no Tamiflu Treat supportively, Hycodan for nighttime cough Fluids, tylenol, motrin RTC for any concerns   Laroy Apple, MD [facility] 03/04/2015, 2:51 PM

## 2015-03-08 ENCOUNTER — Ambulatory Visit: Payer: Self-pay | Admitting: Family Medicine

## 2015-05-04 ENCOUNTER — Other Ambulatory Visit: Payer: Self-pay | Admitting: Dermatology

## 2015-05-06 DIAGNOSIS — C4492 Squamous cell carcinoma of skin, unspecified: Secondary | ICD-10-CM

## 2015-05-06 HISTORY — DX: Squamous cell carcinoma of skin, unspecified: C44.92

## 2015-12-28 ENCOUNTER — Encounter: Payer: Self-pay | Admitting: Family

## 2015-12-28 ENCOUNTER — Ambulatory Visit (INDEPENDENT_AMBULATORY_CARE_PROVIDER_SITE_OTHER): Payer: BC Managed Care – PPO | Admitting: Family

## 2015-12-28 VITALS — BP 127/64 | HR 68 | Temp 97.2°F | Ht 73.5 in | Wt 249.6 lb

## 2015-12-28 DIAGNOSIS — Z6832 Body mass index (BMI) 32.0-32.9, adult: Secondary | ICD-10-CM

## 2015-12-28 DIAGNOSIS — Z713 Dietary counseling and surveillance: Secondary | ICD-10-CM

## 2015-12-28 DIAGNOSIS — E669 Obesity, unspecified: Secondary | ICD-10-CM

## 2015-12-28 MED ORDER — PHENTERMINE HCL 37.5 MG PO TABS: 37.5000 mg | ORAL_TABLET | Freq: Every day | ORAL | 2 refills | Status: DC

## 2015-12-28 NOTE — Progress Notes (Signed)
   Subjective:    Patient ID: [name], male    DOB: 03-13-1960, 55 y.o.   MRN: [id number]  HPI Pt presents to the office today to discuss weight loss medication. PT states he has taken phentermine about 10 years ago with great success and was wanted to try this medication again. PT states he has gained 50 lbs in the last 4 years. PT states he has tried to diet and exercise with little success. PT states his eating is out of controlled and seems to have an increased appetite.    Review of Systems  All other systems reviewed and are negative.      Objective:   Physical Exam  Constitutional: He is oriented to person, place, and time. He appears well-developed and well-nourished. No distress.  obese  HENT:  Head: Normocephalic.  Eyes: Pupils are equal, round, and reactive to light. Right eye exhibits no discharge. Left eye exhibits no discharge.  Neck: Normal range of motion. Neck supple. No thyromegaly present.  Cardiovascular: Normal rate, regular rhythm, normal heart sounds and intact distal pulses.   No murmur heard. Pulmonary/Chest: Effort normal and breath sounds normal. No respiratory distress. He has no wheezes.  Abdominal: Soft. Bowel sounds are normal. He exhibits no distension. There is no tenderness.  Musculoskeletal: Normal range of motion. He exhibits no edema or tenderness.  Neurological: He is alert and oriented to person, place, and time.  Skin: Skin is warm and dry. No rash noted. No erythema.  Psychiatric: He has a normal mood and affect. His behavior is normal. Judgment and thought content normal.  Vitals reviewed.     BP 127/64   Pulse 68   Temp 97.2 F (36.2 C) (Oral)   Ht 6' 1.5" (1.867 m)   Wt 249 lb 9.6 oz (113.2 kg)   BMI 32.48 kg/m      Assessment & Plan:  1. Class 1 obesity with serious comorbidity and body mass index (BMI) of 32.0 to 32.9 in adult, unspecified obesity type - phentermine (ADIPEX-P) 37.5 MG tablet; Take 1 tablet (37.5 mg  total) by mouth daily before breakfast.  Dispense: 30 tablet; Refill: 2 - CMP14+EGFR  2. Weight loss counseling, encounter for - phentermine (ADIPEX-P) 37.5 MG tablet; Take 1 tablet (37.5 mg total) by mouth daily before breakfast.  Dispense: 30 tablet; Refill: 2 - CMP14+EGFR  Will start phentermine today Discussed must lose 5% of weight loss in 3 months to continue Diet and exercise encouraged RTO in 3 months  [name], FNP

## 2015-12-28 NOTE — Patient Instructions (Signed)
Exercising to Lose Weight Introduction Exercising can help you to lose weight. In order to lose weight through exercise, you need to do vigorous-intensity exercise. You can tell that you are exercising with vigorous intensity if you are breathing very hard and fast and cannot hold a conversation while exercising. Moderate-intensity exercise helps to maintain your current weight. You can tell that you are exercising at a moderate level if you have a higher heart rate and faster breathing, but you are still able to hold a conversation. How often should I exercise? Choose an activity that you enjoy and set realistic goals. Your health care provider can help you to make an activity plan that works for you. Exercise regularly as directed by your health care provider. This may include:  Doing resistance training twice each week, such as:  Push-ups.  Sit-ups.  Lifting weights.  Using resistance bands.  Doing a given intensity of exercise for a given amount of time. Choose from these options:  150 minutes of moderate-intensity exercise every week.  75 minutes of vigorous-intensity exercise every week.  A mix of moderate-intensity and vigorous-intensity exercise every week. Children, pregnant women, people who are out of shape, people who are overweight, and older adults may need to consult a health care provider for individual recommendations. If you have any sort of medical condition, be sure to consult your health care provider before starting a new exercise program. What are some activities that can help me to lose weight?  Walking at a rate of at least 4.5 miles an hour.  Jogging or running at a rate of 5 miles per hour.  Biking at a rate of at least 10 miles per hour.  Lap swimming.  Roller-skating or in-line skating.  Cross-country skiing.  Vigorous competitive sports, such as football, basketball, and soccer.  Jumping rope.  Aerobic dancing. How can I be more active in my  day-to-day activities?  Use the stairs instead of the elevator.  Take a walk during your lunch break.  If you drive, park your car farther away from work or school.  If you take public transportation, get off one stop early and walk the rest of the way.  Make all of your phone calls while standing up and walking around.  Get up, stretch, and walk around every 30 minutes throughout the day. What guidelines should I follow while exercising?  Do not exercise so much that you hurt yourself, feel dizzy, or get very short of breath.  Consult your health care provider prior to starting a new exercise program.  Wear comfortable clothes and shoes with good support.  Drink plenty of water while you exercise to prevent dehydration or heat stroke. Body water is lost during exercise and must be replaced.  Work out until you breathe faster and your heart beats faster. This information is not intended to replace advice given to you by your health care provider. Make sure you discuss any questions you have with your health care provider. Document Released: 02/04/2010 Document Revised: 06/10/2015 Document Reviewed: 06/05/2013  2017 Elsevier Calorie Counting for Weight Loss Calories are energy you get from the things you eat and drink. Your body uses this energy to keep you going throughout the day. The number of calories you eat affects your weight. When you eat more calories than your body needs, your body stores the extra calories as fat. When you eat fewer calories than your body needs, your body burns fat to get the energy it needs. Calorie  counting means keeping track of how many calories you eat and drink each day. If you make sure to eat fewer calories than your body needs, you should lose weight. In order for calorie counting to work, you will need to eat the number of calories that are right for you in a day to lose a healthy amount of weight per week. A healthy amount of weight to lose per  week is usually 1-2 lb (0.5-0.9 kg). A dietitian can determine how many calories you need in a day and give you suggestions on how to reach your calorie goal.  WHAT IS MY MY PLAN? My goal is to have __________ calories per day.  If I have this many calories per day, I should lose around __________ pounds per week. WHAT DO I NEED TO KNOW ABOUT CALORIE COUNTING? In order to meet your daily calorie goal, you will need to:  Find out how many calories are in each food you would like to eat. Try to do this before you eat.  Decide how much of the food you can eat.  Write down what you ate and how many calories it had. Doing this is called keeping a food log. WHERE DO I FIND CALORIE INFORMATION? The number of calories in a food can be found on a Nutrition Facts label. Note that all the information on a label is based on a specific serving of the food. If a food does not have a Nutrition Facts label, try to look up the calories online or ask your dietitian for help. HOW DO I DECIDE HOW MUCH TO EAT? To decide how much of the food you can eat, you will need to consider both the number of calories in one serving and the size of one serving. This information can be found on the Nutrition Facts label. If a food does not have a Nutrition Facts label, look up the information online or ask your dietitian for help. Remember that calories are listed per serving. If you choose to have more than one serving of a food, you will have to multiply the calories per serving by the amount of servings you plan to eat. For example, the label on a package of bread might say that a serving size is 1 slice and that there are 90 calories in a serving. If you eat 1 slice, you will have eaten 90 calories. If you eat 2 slices, you will have eaten 180 calories. HOW DO I KEEP A FOOD LOG? After each meal, record the following information in your food log:  What you ate.  How much of it you ate.  How many calories it had.  Then,  add up your calories. Keep your food log near you, such as in a small notebook in your pocket. Another option is to use a mobile app or website. Some programs will calculate calories for you and show you how many calories you have left each time you add an item to the log. WHAT ARE SOME CALORIE COUNTING TIPS?  Use your calories on foods and drinks that will fill you up and not leave you hungry. Some examples of this include foods like nuts and nut butters, vegetables, lean proteins, and high-fiber foods (more than 5 g fiber per serving).  Eat nutritious foods and avoid empty calories. Empty calories are calories you get from foods or beverages that do not have many nutrients, such as candy and soda. It is better to have a nutritious high-calorie food (  such as an avocado) than a food with few nutrients (such as a bag of chips).  Know how many calories are in the foods you eat most often. This way, you do not have to look up how many calories they have each time you eat them.  Look out for foods that may seem like low-calorie foods but are really high-calorie foods, such as baked goods, soda, and fat-free candy.  Pay attention to calories in drinks. Drinks such as sodas, specialty coffee drinks, alcohol, and juices have a lot of calories yet do not fill you up. Choose low-calorie drinks like water and diet drinks.  Focus your calorie counting efforts on higher calorie items. Logging the calories in a garden salad that contains only vegetables is less important than calculating the calories in a milk shake.  Find a way of tracking calories that works for you. Get creative. Most people who are successful find ways to keep track of how much they eat in a day, even if they do not count every calorie. WHAT ARE SOME PORTION CONTROL TIPS?  Know how many calories are in a serving. This will help you know how many servings of a certain food you can have.  Use a measuring cup to measure serving sizes. This  is helpful when you start out. With time, you will be able to estimate serving sizes for some foods.  Take some time to put servings of different foods on your favorite plates, bowls, and cups so you know what a serving looks like.  Try not to eat straight from a bag or box. Doing this can lead to overeating. Put the amount you would like to eat in a cup or on a plate to make sure you are eating the right portion.  Use smaller plates, glasses, and bowls to prevent overeating. This is a quick and easy way to practice portion control. If your plate is smaller, less food can fit on it.  Try not to multitask while eating, such as watching TV or using your computer. If it is time to eat, sit down at a table and enjoy your food. Doing this will help you to start recognizing when you are full. It will also make you more aware of what and how much you are eating. HOW CAN I CALORIE COUNT WHEN EATING OUT?  Ask for smaller portion sizes or child-sized portions.  Consider sharing an entree and sides instead of getting your own entree.  If you get your own entree, eat only half. Ask for a box at the beginning of your meal and put the rest of your entree in it so you are not tempted to eat it.  Look for the calories on the menu. If calories are listed, choose the lower calorie options.  Choose dishes that include vegetables, fruits, whole grains, low-fat dairy products, and lean protein. Focusing on smart food choices from each of the 5 food groups can help you stay on track at restaurants.  Choose items that are boiled, broiled, grilled, or steamed.  Choose water, milk, unsweetened iced tea, or other drinks without added sugars. If you want an alcoholic beverage, choose a lower calorie option. For example, a regular margarita can have up to 700 calories and a glass of wine has around 150.  Stay away from items that are buttered, battered, fried, or served with cream sauce. Items labeled "crispy" are  usually fried, unless stated otherwise.  Ask for dressings, sauces, and syrups on the  side. These are usually very high in calories, so do not eat much of them.  Watch out for salads. Many people think salads are a healthy option, but this is often not the case. Many salads come with bacon, fried chicken, lots of cheese, fried chips, and dressing. All of these items have a lot of calories. If you want a salad, choose a garden salad and ask for grilled meats or steak. Ask for the dressing on the side, or ask for olive oil and vinegar or lemon to use as dressing.  Estimate how many servings of a food you are given. For example, a serving of cooked rice is  cup or about the size of half a tennis ball or one cupcake wrapper. Knowing serving sizes will help you be aware of how much food you are eating at restaurants. The list below tells you how big or small some common portion sizes are based on everyday objects.  1 oz-4 stacked dice.  3 oz-1 deck of cards.  1 tsp-1 dice.  1 Tbsp- a Ping-Pong ball.  2 Tbsp-1 Ping-Pong ball.   cup-1 tennis ball or 1 cupcake wrapper.  1 cup-1 baseball. This information is not intended to replace advice given to you by your health care provider. Make sure you discuss any questions you have with your health care provider. Document Released: 01/02/2005 Document Revised: 01/23/2014 Document Reviewed: 11/07/2012 Elsevier Interactive Patient Education  2017 [employer].

## 2015-12-29 LAB — CMP14+EGFR
ALT: 28 IU/L (ref 0–44)
AST: 22 IU/L (ref 0–40)
Albumin/Globulin Ratio: 2 (ref 1.2–2.2)
Albumin: 4.8 g/dL (ref 3.5–5.5)
Alkaline Phosphatase: 71 IU/L (ref 39–117)
BUN/Creatinine Ratio: 13 (ref 9–20)
BUN: 16 mg/dL (ref 6–24)
Bilirubin Total: 0.5 mg/dL (ref 0.0–1.2)
CO2: 22 mmol/L (ref 18–29)
Calcium: 9.2 mg/dL (ref 8.7–10.2)
Chloride: 102 mmol/L (ref 96–106)
Creatinine, Ser: 1.19 mg/dL (ref 0.76–1.27)
GFR calc Af Amer: 78 mL/min/{1.73_m2} (ref >59)
GFR calc non Af Amer: 68 mL/min/{1.73_m2} (ref >59)
Globulin, Total: 2.4 g/dL (ref 1.5–4.5)
Glucose: 98 mg/dL (ref 65–99)
Potassium: 4.4 mmol/L (ref 3.5–5.2)
Sodium: 141 mmol/L (ref 134–144)
Total Protein: 7.2 g/dL (ref 6.0–8.5)

## 2016-01-14 ENCOUNTER — Telehealth: Payer: Self-pay | Admitting: Family Medicine

## 2016-01-14 NOTE — Telephone Encounter (Signed)
Patient aware and verbalizes understanding. Patient would like to know if he can get a refill since he is going to run out since he has been taking 1.5 since he has started taking them. Please advise

## 2016-01-14 NOTE — Telephone Encounter (Signed)
Patient aware.

## 2016-01-14 NOTE — Telephone Encounter (Signed)
Pt needs to only take one tab daily. The max daily dose is 37.5mg  daily.

## 2016-01-14 NOTE — Telephone Encounter (Signed)
No, I can not refill early.

## 2016-01-14 NOTE — Telephone Encounter (Signed)
Patient states he has been taking 1.5 tablets of his phentermine. Patient states that one tablet does not work and he is going to run out before his 30 day supply is done. Please advise.

## 2016-02-02 ENCOUNTER — Other Ambulatory Visit: Payer: Self-pay | Admitting: Family Medicine

## 2016-03-23 ENCOUNTER — Telehealth: Payer: Self-pay | Admitting: *Deleted

## 2016-03-23 DIAGNOSIS — Z713 Dietary counseling and surveillance: Secondary | ICD-10-CM

## 2016-03-23 DIAGNOSIS — E669 Obesity, unspecified: Secondary | ICD-10-CM

## 2016-03-23 DIAGNOSIS — Z6832 Body mass index (BMI) 32.0-32.9, adult: Principal | ICD-10-CM

## 2016-03-23 MED ORDER — PHENTERMINE HCL 37.5 MG PO TABS: 37.5000 mg | ORAL_TABLET | Freq: Every day | ORAL | 0 refills | Status: DC

## 2016-03-23 NOTE — Telephone Encounter (Signed)
Pt needs refill on Phentermine Has appt on 03/31/2016 Please advise

## 2016-03-23 NOTE — Telephone Encounter (Signed)
Please call one month in for pt. Thanks

## 2016-03-23 NOTE — Telephone Encounter (Signed)
Rx called in and wife aware.

## 2016-03-28 ENCOUNTER — Ambulatory Visit: Payer: BC Managed Care – PPO | Admitting: Family

## 2016-03-31 ENCOUNTER — Ambulatory Visit (INDEPENDENT_AMBULATORY_CARE_PROVIDER_SITE_OTHER): Payer: BC Managed Care – PPO | Admitting: Family

## 2016-03-31 ENCOUNTER — Encounter: Payer: Self-pay | Admitting: Family

## 2016-03-31 VITALS — BP 136/90 | HR 68 | Temp 97.0°F | Ht 73.5 in | Wt 232.2 lb

## 2016-03-31 DIAGNOSIS — Z6832 Body mass index (BMI) 32.0-32.9, adult: Secondary | ICD-10-CM | POA: Diagnosis not present

## 2016-03-31 DIAGNOSIS — Z713 Dietary counseling and surveillance: Secondary | ICD-10-CM

## 2016-03-31 DIAGNOSIS — E669 Obesity, unspecified: Secondary | ICD-10-CM

## 2016-03-31 MED ORDER — PHENTERMINE HCL 37.5 MG PO TABS: 37.5000 mg | ORAL_TABLET | Freq: Every day | ORAL | 2 refills | Status: DC

## 2016-03-31 NOTE — Progress Notes (Signed)
   Subjective:    Patient ID: [name], male    DOB: 1960-09-27, 56 y.o.   MRN: [id number]  HPI Pt presents to the office today to discuss weight loss since being started on phentermine. PT states he has lost about 17 lbs.  PT states he has tried to diet, but has not exercised. Pt states he would like continue to stay on the phentermine.   Review of Systems  All other systems reviewed and are negative.      Objective:   Physical Exam  Constitutional: He is oriented to person, place, and time. He appears well-developed and well-nourished. No distress.  obese  HENT:  Head: Normocephalic.  Eyes: Pupils are equal, round, and reactive to light. Right eye exhibits no discharge. Left eye exhibits no discharge.  Neck: Normal range of motion. Neck supple. No thyromegaly present.  Cardiovascular: Normal rate, regular rhythm, normal heart sounds and intact distal pulses.   No murmur heard. Pulmonary/Chest: Effort normal and breath sounds normal. No respiratory distress. He has no wheezes.  Abdominal: Soft. Bowel sounds are normal. He exhibits no distension. There is no tenderness.  Musculoskeletal: Normal range of motion. He exhibits no edema or tenderness.  Neurological: He is alert and oriented to person, place, and time.  Skin: Skin is warm and dry. No rash noted. No erythema.  Psychiatric: He has a normal mood and affect. His behavior is normal. Judgment and thought content normal.  Vitals reviewed.     BP 136/90   Pulse 68   Temp 97 F (36.1 C) (Oral)   Ht 6' 1.5" (1.867 m)   Wt 232 lb 3.2 oz (105.3 kg)   BMI 30.22 kg/m      Assessment & Plan:  1. Class 1 obesity with serious comorbidity and body mass index (BMI) of 32.0 to 32.9 in adult, unspecified obesity type - phentermine (ADIPEX-P) 37.5 MG tablet; Take 1 tablet (37.5 mg total) by mouth daily before breakfast.  Dispense: 30 tablet; Refill: 2 - CMP14+EGFR  2. Weight loss counseling, encounter for - phentermine  (ADIPEX-P) 37.5 MG tablet; Take 1 tablet (37.5 mg total) by mouth daily before breakfast.  Dispense: 30 tablet; Refill: 2 - CMP14+EGFR  Will continue  phentermine today Discussed must lose 5% of weight loss in 3 months to continue Diet and exercise encouraged RTO in 3 months  [name], FNP

## 2016-03-31 NOTE — Patient Instructions (Signed)

## 2016-04-01 LAB — CMP14+EGFR
ALT: 26 IU/L (ref 0–44)
AST: 20 IU/L (ref 0–40)
Albumin/Globulin Ratio: 2 (ref 1.2–2.2)
Albumin: 4.6 g/dL (ref 3.5–5.5)
Alkaline Phosphatase: 75 IU/L (ref 39–117)
BUN/Creatinine Ratio: 13 (ref 9–20)
BUN: 14 mg/dL (ref 6–24)
Bilirubin Total: 0.4 mg/dL (ref 0.0–1.2)
CO2: 23 mmol/L (ref 18–29)
Calcium: 9.2 mg/dL (ref 8.7–10.2)
Chloride: 103 mmol/L (ref 96–106)
Creatinine, Ser: 1.05 mg/dL (ref 0.76–1.27)
GFR calc Af Amer: 91 mL/min/{1.73_m2} (ref >59)
GFR calc non Af Amer: 79 mL/min/{1.73_m2} (ref >59)
Globulin, Total: 2.3 g/dL (ref 1.5–4.5)
Glucose: 97 mg/dL (ref 65–99)
Potassium: 4.3 mmol/L (ref 3.5–5.2)
Sodium: 141 mmol/L (ref 134–144)
Total Protein: 6.9 g/dL (ref 6.0–8.5)

## 2016-05-01 ENCOUNTER — Other Ambulatory Visit: Payer: Self-pay | Admitting: Dermatology

## 2016-07-05 ENCOUNTER — Ambulatory Visit: Payer: BC Managed Care – PPO | Admitting: Family Medicine

## 2017-02-09 ENCOUNTER — Other Ambulatory Visit: Payer: Self-pay | Admitting: Family

## 2017-02-12 NOTE — Telephone Encounter (Signed)
Last seen 03/31/16  [name]   Dr Wendi Snipes PCP

## 2017-02-12 NOTE — Telephone Encounter (Signed)
Patient aware.

## 2017-03-22 ENCOUNTER — Encounter: Payer: Self-pay | Admitting: Family Medicine

## 2017-03-22 ENCOUNTER — Ambulatory Visit: Payer: BC Managed Care – PPO | Admitting: Family Medicine

## 2017-03-22 VITALS — BP 151/91 | HR 75 | Temp 96.7°F | Ht 73.5 in | Wt 253.6 lb

## 2017-03-22 DIAGNOSIS — I10 Essential (primary) hypertension: Secondary | ICD-10-CM | POA: Diagnosis not present

## 2017-03-22 DIAGNOSIS — N529 Male erectile dysfunction, unspecified: Secondary | ICD-10-CM

## 2017-03-22 MED ORDER — SILDENAFIL CITRATE 20 MG PO TABS: ORAL_TABLET | ORAL | 3 refills | Status: DC

## 2017-03-22 NOTE — Patient Instructions (Signed)
Great to see you!   

## 2017-03-22 NOTE — Progress Notes (Signed)
   HPI  Patient presents today here to discuss ED and hypertension.  ED Patient states he has had difficulty getting and maintaining an erection now for about 1 year. He reports good medication compliance with blood pressure medicines. His wife is a well-known to [place] home health nurse manager in our system, he has not been checking his blood pressure at home.  He denies any headache or chest pain. He was nervous about her conversation  PMH: Smoking status noted ROS: Per HPI  Objective: BP (!) 151/91   Pulse 75   Temp (!) 96.7 F (35.9 C) (Oral)   Ht 6' 1.5" (1.867 m)   Wt 253 lb 9.6 oz (115 kg)   BMI 33.00 kg/m  Gen: NAD, alert, cooperative with exam HEENT: NCAT CV: RRR, good S1/S2, no murmur Resp: CTABL, no wheezes, non-labored Abd: SNTND, BS present, no guarding or organomegaly Ext: No edema, warm Neuro: Alert and oriented, No gross deficits  Assessment and plan:  #Erectile dysfunction New onset over about the last year. Viagra and similar medications do not appear to be well covered by his insurance, we have given generic sildenafil   #Hypertension Patient is likely very anxious about her conversation today His wife can easily check his blood pressure which he will asked her to do at home. Later today No changes Labs   Orders Placed This Encounter  Procedures  . CMP14+EGFR  . CBC with Differential/Platelet  . TSH    Meds ordered this encounter  Medications  . sildenafil (REVATIO) 20 MG tablet    Sig: Take 3-5 tablets daily as needed for ED    Dispense:  30 tablet    Refill:  Griffin, MD [facility] 03/22/2017, 2:57 PM

## 2017-03-23 LAB — CBC WITH DIFFERENTIAL/PLATELET
Basophils Absolute: 0 10*3/uL (ref 0.0–0.2)
Basos: 0 %
EOS (ABSOLUTE): 0.1 10*3/uL (ref 0.0–0.4)
Eos: 1 %
Hematocrit: 48.1 % (ref 37.5–51.0)
Hemoglobin: 16.4 g/dL (ref 13.0–17.7)
Immature Grans (Abs): 0 10*3/uL (ref 0.0–0.1)
Immature Granulocytes: 0 %
Lymphocytes Absolute: 2.3 10*3/uL (ref 0.7–3.1)
Lymphs: 29 %
MCH: 30.4 pg (ref 26.6–33.0)
MCHC: 34.1 g/dL (ref 31.5–35.7)
MCV: 89 fL (ref 79–97)
Monocytes Absolute: 0.9 10*3/uL (ref 0.1–0.9)
Monocytes: 11 %
Neutrophils Absolute: 4.8 10*3/uL (ref 1.4–7.0)
Neutrophils: 59 %
Platelets: 246 10*3/uL (ref 150–379)
RBC: 5.39 x10E6/uL (ref 4.14–5.80)
RDW: 13.3 % (ref 12.3–15.4)
WBC: 8.1 10*3/uL (ref 3.4–10.8)

## 2017-03-23 LAB — CMP14+EGFR
ALT: 26 IU/L (ref 0–44)
AST: 21 IU/L (ref 0–40)
Albumin/Globulin Ratio: 2 (ref 1.2–2.2)
Albumin: 4.7 g/dL (ref 3.5–5.5)
Alkaline Phosphatase: 72 IU/L (ref 39–117)
BUN/Creatinine Ratio: 13 (ref 9–20)
BUN: 12 mg/dL (ref 6–24)
Bilirubin Total: 0.4 mg/dL (ref 0.0–1.2)
CO2: 22 mmol/L (ref 20–29)
Calcium: 9.4 mg/dL (ref 8.7–10.2)
Chloride: 101 mmol/L (ref 96–106)
Creatinine, Ser: 0.94 mg/dL (ref 0.76–1.27)
GFR calc Af Amer: 104 mL/min/{1.73_m2} (ref >59)
GFR calc non Af Amer: 90 mL/min/{1.73_m2} (ref >59)
Globulin, Total: 2.4 g/dL (ref 1.5–4.5)
Glucose: 93 mg/dL (ref 65–99)
Potassium: 4.5 mmol/L (ref 3.5–5.2)
Sodium: 139 mmol/L (ref 134–144)
Total Protein: 7.1 g/dL (ref 6.0–8.5)

## 2017-03-23 LAB — TSH: TSH: 2.43 u[IU]/mL (ref 0.450–4.500)

## 2017-05-18 ENCOUNTER — Other Ambulatory Visit: Payer: Self-pay | Admitting: Family Medicine

## 2017-06-08 ENCOUNTER — Other Ambulatory Visit: Payer: Self-pay | Admitting: Physician Assistant

## 2017-06-08 DIAGNOSIS — D229 Melanocytic nevi, unspecified: Secondary | ICD-10-CM

## 2017-06-08 HISTORY — DX: Melanocytic nevi, unspecified: D22.9

## 2017-09-06 ENCOUNTER — Other Ambulatory Visit: Payer: Self-pay | Admitting: Physician Assistant

## 2017-12-10 ENCOUNTER — Other Ambulatory Visit: Payer: Self-pay | Admitting: *Deleted

## 2017-12-10 MED ORDER — METOPROLOL SUCCINATE ER 25 MG PO TB24: 25.0000 mg | ORAL_TABLET | Freq: Every day | ORAL | 0 refills | Status: DC

## 2017-12-10 MED ORDER — AMLODIPINE BESY-BENAZEPRIL HCL 5-10 MG PO CAPS: 1.0000 | ORAL_CAPSULE | Freq: Every day | ORAL | 0 refills | Status: DC

## 2018-01-07 ENCOUNTER — Ambulatory Visit: Payer: BC Managed Care – PPO | Admitting: Family

## 2018-01-07 ENCOUNTER — Encounter: Payer: Self-pay | Admitting: Family

## 2018-01-07 VITALS — BP 130/79 | HR 76 | Temp 98.1°F | Ht 73.5 in | Wt 245.0 lb

## 2018-01-07 DIAGNOSIS — Z6832 Body mass index (BMI) 32.0-32.9, adult: Secondary | ICD-10-CM

## 2018-01-07 DIAGNOSIS — Z0001 Encounter for general adult medical examination with abnormal findings: Secondary | ICD-10-CM | POA: Diagnosis not present

## 2018-01-07 DIAGNOSIS — E669 Obesity, unspecified: Secondary | ICD-10-CM | POA: Diagnosis not present

## 2018-01-07 DIAGNOSIS — I10 Essential (primary) hypertension: Secondary | ICD-10-CM

## 2018-01-07 DIAGNOSIS — Z0000 Encounter for general adult medical examination without abnormal findings: Secondary | ICD-10-CM

## 2018-01-07 DIAGNOSIS — E785 Hyperlipidemia, unspecified: Secondary | ICD-10-CM | POA: Diagnosis not present

## 2018-01-07 MED ORDER — AMLODIPINE BESY-BENAZEPRIL HCL 5-10 MG PO CAPS: 1.0000 | ORAL_CAPSULE | Freq: Every day | ORAL | 3 refills | Status: DC

## 2018-01-07 MED ORDER — METOPROLOL SUCCINATE ER 25 MG PO TB24: 25.0000 mg | ORAL_TABLET | Freq: Every day | ORAL | 3 refills | Status: DC

## 2018-01-07 MED ORDER — SILDENAFIL CITRATE 20 MG PO TABS: ORAL_TABLET | ORAL | 3 refills | Status: DC

## 2018-01-07 NOTE — Progress Notes (Signed)
Subjective:    Patient ID: [name]., male    DOB: 11/12/1960, 57 y.o.   MRN: [id number]  Chief Complaint  Patient presents with  . Follow-up    medications   PT presents to the office today for CPE.  Hypertension  This is a chronic problem. The current episode started more than 1 year ago. The problem has been resolved since onset. The problem is controlled. Pertinent negatives include no headaches, malaise/fatigue, peripheral edema or shortness of breath. Risk factors for coronary artery disease include male gender and family history. The current treatment provides moderate improvement. Hypertensive end-organ damage includes CAD/MI. There is no history of CVA or heart failure.  Hyperlipidemia  This is a chronic problem. The current episode started more than 1 year ago. The problem is uncontrolled. Exacerbating diseases include obesity. Pertinent negatives include no shortness of breath. Current antihyperlipidemic treatment includes diet change. The current treatment provides no improvement of lipids. Risk factors for coronary artery disease include dyslipidemia, hypertension, male sex and a sedentary lifestyle.  ED Takes Sildenafil as needed. Working well.    Review of Systems  Constitutional: Negative for malaise/fatigue.  Respiratory: Negative for shortness of breath.   Neurological: Negative for headaches.  All other systems reviewed and are negative.  History reviewed. No pertinent family history. Social History   Socioeconomic History  . Marital status: Married    Spouse name: Not on file  . Number of children: Not on file  . Years of education: Not on file  . Highest education level: Not on file  Occupational History  . Not on file  Social Needs  . Financial resource strain: Not on file  . Food insecurity:    Worry: Not on file    Inability: Not on file  . Transportation needs:    Medical: Not on file    Non-medical: Not on file  Tobacco Use  . Smoking  status: Former Smoker    Packs/day: 0.50    Years: 2.00    Pack years: 1.00    Types: Cigarettes    Last attempt to quit: 01/17/1983    Years since quitting: 34.9  . Smokeless tobacco: Never Used  Substance and Sexual Activity  . Alcohol use: No  . Drug use: No  . Sexual activity: Not on file  Lifestyle  . Physical activity:    Days per week: Not on file    Minutes per session: Not on file  . Stress: Not on file  Relationships  . Social connections:    Talks on phone: Not on file    Gets together: Not on file    Attends religious service: Not on file    Active member of club or organization: Not on file    Attends meetings of clubs or organizations: Not on file    Relationship status: Not on file  Other Topics Concern  . Not on file  Social History Narrative  . Not on file        Objective:   Physical Exam Vitals signs reviewed.  Constitutional:      General: He is not in acute distress.    Appearance: He is well-developed.  HENT:     Head: Normocephalic.     Right Ear: Tympanic membrane normal.     Left Ear: Tympanic membrane normal.     Mouth/Throat:     Pharynx: Posterior oropharyngeal erythema present.  Eyes:     General:  Right eye: No discharge.        Left eye: No discharge.     Pupils: Pupils are equal, round, and reactive to light.  Neck:     Musculoskeletal: Normal range of motion and neck supple.     Thyroid: No thyromegaly.  Cardiovascular:     Rate and Rhythm: Normal rate and regular rhythm.     Heart sounds: Normal heart sounds. No murmur.  Pulmonary:     Effort: Pulmonary effort is normal. No respiratory distress.     Breath sounds: Normal breath sounds. No wheezing.  Abdominal:     General: Bowel sounds are normal. There is no distension.     Palpations: Abdomen is soft.     Tenderness: There is no abdominal tenderness.  Musculoskeletal: Normal range of motion.        General: No tenderness.  Skin:    General: Skin is warm and dry.      Findings: No erythema or rash.  Neurological:     Mental Status: He is alert and oriented to person, place, and time.     Cranial Nerves: No cranial nerve deficit.     Deep Tendon Reflexes: Reflexes are normal and symmetric.  Psychiatric:        Behavior: Behavior normal.        Thought Content: Thought content normal.        Judgment: Judgment normal.       BP 130/79   Pulse 76   Temp 98.1 F (36.7 C)   Ht 6' 1.5" (1.867 m)   Wt 245 lb (111.1 kg)   BMI 31.89 kg/m      Assessment & Plan:  [name]. comes in today with chief complaint of Follow-up (medications)   Diagnosis and orders addressed:  1. Annual physical exam - CMP14+EGFR - CBC with Differential/Platelet - Lipid panel - TSH - PSA, total and free  2. Essential hypertension - CMP14+EGFR - CBC with Differential/Platelet  3. Hyperlipidemia, unspecified hyperlipidemia type - CMP14+EGFR - CBC with Differential/Platelet - Lipid panel  4. Class 1 obesity with serious comorbidity and body mass index (BMI) of 32.0 to 32.9 in adult, unspecified obesity type - CMP14+EGFR - CBC with Differential/Platelet   Labs pending Health Maintenance reviewed Diet and exercise encouraged  Follow up plan: 1 year   [name], FNP

## 2018-01-07 NOTE — Patient Instructions (Signed)

## 2018-01-08 LAB — CBC WITH DIFFERENTIAL/PLATELET
Basophils Absolute: 0 10*3/uL (ref 0.0–0.2)
Basos: 1 %
EOS (ABSOLUTE): 0.1 10*3/uL (ref 0.0–0.4)
Eos: 2 %
Hematocrit: 47.5 % (ref 37.5–51.0)
Hemoglobin: 16.4 g/dL (ref 13.0–17.7)
Immature Grans (Abs): 0 10*3/uL (ref 0.0–0.1)
Immature Granulocytes: 0 %
Lymphocytes Absolute: 1.8 10*3/uL (ref 0.7–3.1)
Lymphs: 29 %
MCH: 30.3 pg (ref 26.6–33.0)
MCHC: 34.5 g/dL (ref 31.5–35.7)
MCV: 88 fL (ref 79–97)
Monocytes Absolute: 0.8 10*3/uL (ref 0.1–0.9)
Monocytes: 13 %
Neutrophils Absolute: 3.6 10*3/uL (ref 1.4–7.0)
Neutrophils: 55 %
Platelets: 215 10*3/uL (ref 150–450)
RBC: 5.41 x10E6/uL (ref 4.14–5.80)
RDW: 12.3 % (ref 12.3–15.4)
WBC: 6.4 10*3/uL (ref 3.4–10.8)

## 2018-01-08 LAB — CMP14+EGFR
ALT: 24 IU/L (ref 0–44)
AST: 22 IU/L (ref 0–40)
Albumin/Globulin Ratio: 2.4 — ABNORMAL HIGH (ref 1.2–2.2)
Albumin: 4.6 g/dL (ref 3.5–5.5)
Alkaline Phosphatase: 69 IU/L (ref 39–117)
BUN/Creatinine Ratio: 15 (ref 9–20)
BUN: 14 mg/dL (ref 6–24)
Bilirubin Total: 0.7 mg/dL (ref 0.0–1.2)
CO2: 21 mmol/L (ref 20–29)
Calcium: 9.1 mg/dL (ref 8.7–10.2)
Chloride: 107 mmol/L — ABNORMAL HIGH (ref 96–106)
Creatinine, Ser: 0.93 mg/dL (ref 0.76–1.27)
GFR calc Af Amer: 104 mL/min/{1.73_m2} (ref >59)
GFR calc non Af Amer: 90 mL/min/{1.73_m2} (ref >59)
Globulin, Total: 1.9 g/dL (ref 1.5–4.5)
Glucose: 96 mg/dL (ref 65–99)
Potassium: 4.4 mmol/L (ref 3.5–5.2)
Sodium: 143 mmol/L (ref 134–144)
Total Protein: 6.5 g/dL (ref 6.0–8.5)

## 2018-01-08 LAB — LIPID PANEL
Chol/HDL Ratio: 4.6 ratio (ref 0.0–5.0)
Cholesterol, Total: 174 mg/dL (ref 100–199)
HDL: 38 mg/dL — ABNORMAL LOW (ref >39)
LDL Calculated: 108 mg/dL — ABNORMAL HIGH (ref 0–99)
Triglycerides: 138 mg/dL (ref 0–149)
VLDL Cholesterol Cal: 28 mg/dL (ref 5–40)

## 2018-01-08 LAB — PSA, TOTAL AND FREE
PSA, Free Pct: 25 %
PSA, Free: 0.15 ng/mL
Prostate Specific Ag, Serum: 0.6 ng/mL (ref 0.0–4.0)

## 2018-01-08 LAB — TSH: TSH: 2.29 u[IU]/mL (ref 0.450–4.500)

## 2018-01-10 ENCOUNTER — Other Ambulatory Visit: Payer: Self-pay | Admitting: Family

## 2018-01-10 MED ORDER — ATORVASTATIN CALCIUM 20 MG PO TABS: 20.0000 mg | ORAL_TABLET | Freq: Every day | ORAL | 3 refills | Status: DC

## 2018-01-31 ENCOUNTER — Other Ambulatory Visit: Payer: Self-pay | Admitting: Physician Assistant

## 2018-02-18 ENCOUNTER — Telehealth: Payer: Self-pay

## 2018-02-18 MED ORDER — OSELTAMIVIR PHOSPHATE 75 MG PO CAPS: 75.0000 mg | ORAL_CAPSULE | Freq: Every day | ORAL | 0 refills | Status: DC

## 2018-02-18 NOTE — Telephone Encounter (Signed)
Wife states that granddaughter has flu and would like tamiful sent in for patient. Uses [facility] in [place]. Please advise

## 2018-02-18 NOTE — Telephone Encounter (Signed)
Tamiflu Prescription sent to pharmacy. Take daily, unless symptoms develop then increase to twice a day until prescription is complete.

## 2018-02-18 NOTE — Telephone Encounter (Signed)
Patient aware.

## 2018-04-09 ENCOUNTER — Other Ambulatory Visit: Payer: Self-pay

## 2018-04-09 ENCOUNTER — Telehealth (INDEPENDENT_AMBULATORY_CARE_PROVIDER_SITE_OTHER): Payer: BC Managed Care – PPO | Admitting: Family Medicine

## 2018-04-09 DIAGNOSIS — M25562 Pain in left knee: Secondary | ICD-10-CM

## 2018-04-09 DIAGNOSIS — Z96652 Presence of left artificial knee joint: Secondary | ICD-10-CM | POA: Diagnosis not present

## 2018-04-09 DIAGNOSIS — G8929 Other chronic pain: Secondary | ICD-10-CM | POA: Diagnosis not present

## 2018-04-09 MED ORDER — DICLOFENAC SODIUM 1 % TD GEL: 4.0000 g | Freq: Four times a day (QID) | TRANSDERMAL | 0 refills | Status: DC

## 2018-04-09 NOTE — Progress Notes (Signed)
Telephone visit  Subjective: CC: left knee pain PCP: Sharion Balloon, FNP ZOX:[id number] R [name]. is a 58 y.o. male calls for telephone consult today. Patient provides verbal consent for consult held via phone.  Location of patient: home Location of provider: [facility] Others present for call: wife  1. Left knee pain Patient reports that he had acute on chronic onset of left-sided knee pain and stiffness over the last couple of days.  He notes that he overall felt poorly as a result of this and being exposed to pollen outside and therefore took off of work.  He was told not to return without a doctor's note because there was concern for COVID-19 exposure.  He denies any cough, fevers, congestion or any respiratory symptoms.  He denies any joint swelling, discoloration, increased warmth.  He has been using Tylenol alternating with Motrin with little improvement in the symptoms.  He denies any sensation changes in the lower extremity.  He had a history of total knee replacement bilaterally with left side being performed in January 2014 with Dr. Alma Friendly.  He has not contacted his orthopedists office for instruction with regards to knee.  He has never been prescribed topical NSAID but would be interested in this.   ROS: Per HPI  No Known Allergies Past Medical History:  Diagnosis Date  . Hyperlipemia   . Hypertension    on meds for 8 years    Current Outpatient Medications:  .  amLODipine-benazepril (LOTREL) 5-10 MG capsule, Take 1 capsule by mouth daily. (Needs to be seen before next refill), Disp: 90 capsule, Rfl: 3 .  aspirin EC 81 MG tablet, Take 81 mg by mouth daily., Disp: , Rfl:  .  atorvastatin (LIPITOR) 20 MG tablet, Take 1 tablet (20 mg total) by mouth daily., Disp: 90 tablet, Rfl: 3 .  metoprolol succinate (TOPROL-XL) 25 MG 24 hr tablet, Take 1 tablet (25 mg total) by mouth daily. (Needs to be seen before next refill), Disp: 90 tablet, Rfl: 3 .  oseltamivir (TAMIFLU) 75 MG capsule, Take  1 capsule (75 mg total) by mouth daily., Disp: 10 capsule, Rfl: 0 .  sildenafil (REVATIO) 20 MG tablet, Take 3-5 tablets daily as needed for ED, Disp: 30 tablet, Rfl: 3  Assessment/ Plan: 58 y.o. male   1. Chronic knee pain after total replacement of left knee joint Aching in left knee is chronic for patient.  His primary concern was that his work would not allow him to return because he was having any body aches and they were worried about COVID-19 infection per his report.  At this time, I do not think that the patient is infected.  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  I have sent in Voltaren gel for him to apply topically up to 4 times daily if needed.  Oral NSAID was considered but given his history of cardiac event topical was preferred.  We discussed reasons for reevaluation including signs and symptoms of infection, joint infection etc.  Work note provided.  Ok to return.  He will follow-up with PCP PRN - diclofenac sodium (VOLTAREN) 1 % GEL; Apply 4 g topically 4 (four) times daily.  Dispense: 400 g; Refill: 0   Start time: 10:59am End time: 11:04am  No orders of the defined types were placed in this encounter.   Janora Norlander, DO [place] [phone]

## 2018-04-11 ENCOUNTER — Telehealth: Payer: Self-pay

## 2018-04-11 MED ORDER — DICLOFENAC SODIUM 75 MG PO TBEC: 75.0000 mg | DELAYED_RELEASE_TABLET | Freq: Two times a day (BID) | ORAL | 0 refills | Status: DC

## 2018-04-11 NOTE — Telephone Encounter (Signed)
Insurance denied diclofenac gel. Will send in oral diclofenac. Please take with food.

## 2018-04-11 NOTE — Telephone Encounter (Signed)
Aware of new medicine. 

## 2018-04-11 NOTE — Telephone Encounter (Signed)
Insurance denied prior authorization for diclofenac gel.  Only covers when unable to use oral anti-inflammatory drugs.

## 2018-07-10 ENCOUNTER — Telehealth: Payer: Self-pay | Admitting: Family

## 2018-07-10 NOTE — Telephone Encounter (Signed)
appt scheduled Pt notified 

## 2018-07-12 ENCOUNTER — Ambulatory Visit (INDEPENDENT_AMBULATORY_CARE_PROVIDER_SITE_OTHER): Payer: BC Managed Care – PPO | Admitting: Family

## 2018-07-12 ENCOUNTER — Other Ambulatory Visit: Payer: Self-pay

## 2018-07-12 ENCOUNTER — Encounter: Payer: Self-pay | Admitting: Family

## 2018-07-12 DIAGNOSIS — R0683 Snoring: Secondary | ICD-10-CM

## 2018-07-12 DIAGNOSIS — R400 Somnolence: Secondary | ICD-10-CM | POA: Diagnosis not present

## 2018-07-12 NOTE — Progress Notes (Signed)
   Virtual Visit via telephone Note  I connected with [name]. on 07/12/18 at 10:27 AM  by telephone and verified that I am speaking with the correct person using two identifiers. [name]. is currently located at home and  Granddaughter  is currently with her during visit. The provider, [name], FNP is located in their office at time of visit.  I discussed the limitations, risks, security and privacy concerns of performing an evaluation and management service by telephone and the availability of in person appointments. I also discussed with the patient that there may be a patient responsible charge related to this service. The patient expressed understanding and agreed to proceed.   History and Present Illness:  HPI  Pt calls the office today with complaints of snoring and apnea per his wife. He states his wife has been saying this for over a year, but he has not wanted to go for the testing. However, he states he has felt more tired and wants to get it checked out.  He states his daughter passed away Oct 11, 2017 and has been dealing with this as good as possible.   Review of Systems  All other systems reviewed and are negative.    Observations/Objective: No SOB or distress noted  Assessment and Plan: 1. Snoring - Ambulatory referral to Pulmonology  2. Daytime sleepiness - Ambulatory referral to Pulmonology  Will do referral to sleep study to rule out OSA Encouraged healthy diet and exercise Maintain healthy weight RTO as needed and keep chronic follow up   I discussed the assessment and treatment plan with the patient. The patient was provided an opportunity to ask questions and all were answered. The patient agreed with the plan and demonstrated an understanding of the instructions.   The patient was advised to call back or seek an in-person evaluation if the symptoms worsen or if the condition fails to improve as anticipated.  The above assessment and  management plan was discussed with the patient. The patient verbalized understanding of and has agreed to the management plan. Patient is aware to call the clinic if symptoms persist or worsen. Patient is aware when to return to the clinic for a follow-up visit. Patient educated on when it is appropriate to go to the emergency department.   Time call ended:  10:38 AM  I provided 12 minutes of non-face-to-face time during this encounter.    [name], FNP

## 2018-07-18 ENCOUNTER — Telehealth: Payer: Self-pay | Admitting: Family

## 2018-07-29 ENCOUNTER — Encounter: Payer: Self-pay | Admitting: Internal Medicine

## 2018-07-29 ENCOUNTER — Other Ambulatory Visit: Payer: Self-pay

## 2018-07-29 ENCOUNTER — Ambulatory Visit: Payer: BC Managed Care – PPO | Admitting: Internal Medicine

## 2018-07-29 ENCOUNTER — Institutional Professional Consult (permissible substitution): Payer: BC Managed Care – PPO | Admitting: Pulmonary Disease

## 2018-07-29 VITALS — BP 138/80 | HR 108 | Temp 98.2°F | Ht 72.0 in | Wt 224.4 lb

## 2018-07-29 DIAGNOSIS — G4733 Obstructive sleep apnea (adult) (pediatric): Secondary | ICD-10-CM | POA: Diagnosis not present

## 2018-07-29 DIAGNOSIS — R1310 Dysphagia, unspecified: Secondary | ICD-10-CM

## 2018-07-29 DIAGNOSIS — R0683 Snoring: Secondary | ICD-10-CM | POA: Diagnosis not present

## 2018-07-29 DIAGNOSIS — R1319 Other dysphagia: Secondary | ICD-10-CM

## 2018-07-29 NOTE — Assessment & Plan Note (Signed)
Probable obstructive sleep apnea.Appropriate discussion of basic physiology, testing, treatment options, importance of weight , responsibility to drive safely. Plan- schedule sleep study.

## 2018-07-29 NOTE — Progress Notes (Signed)
07/29/2018- 58 yoM former smoker for sleep evaluation. referred by PCP [name], FNP) for snoring and daytime sleepiness Medical problem list includes  CAD/ MI, HBP, Hyperlipidemia, Obesity, Osteoarthritis Body weight today 224 lbs Epworth score 6 Daughter 59 yo died of seizure last year- grief impacts sleep. Wife, nurse, tells him of loud snore and witnessed apnea.  No sleep med, 1 cup coffee and 1 soft drink.  ENT + septoplasty. Still has tonsils.  Occasional food hang-up mid sternal level. No hx significant GERD.  Prior to Admission medications   Medication Sig Start Date End Date Taking? Authorizing Provider  amLODipine-benazepril (LOTREL) 5-10 MG capsule Take 1 capsule by mouth daily. (Needs to be seen before next refill) 01/07/18  Yes [name] A, FNP  aspirin EC 81 MG tablet Take 81 mg by mouth daily.   Yes [provider]  metoprolol succinate (TOPROL-XL) 25 MG 24 hr tablet Take 1 tablet (25 mg total) by mouth daily. (Needs to be seen before next refill) 01/07/18  Yes [name] A, FNP  sildenafil (REVATIO) 20 MG tablet Take 3-5 tablets daily as needed for ED 01/07/18  Yes Sharion Balloon, FNP   Past Medical History:  Diagnosis Date  . Hyperlipemia   . Hypertension    on meds for 8 years   Past Surgical History:  Procedure Laterality Date  . BACK SURGERY    . CARDIOVASCULAR STRESS TEST    . JOINT REPLACEMENT    . KNEE ARTHROSCOPY    . KNEE ARTHROSCOPY WITH PATELLAR TENDON REPAIR    . rt knee replacement  2008  . TOTAL KNEE ARTHROPLASTY  02/16/2012   Procedure: TOTAL KNEE ARTHROPLASTY;  Surgeon: Augustin Schooling, MD;  Location: [place];  Service: Orthopedics;  Laterality: Left;   History reviewed. No pertinent family history. Social History   Socioeconomic History  . Marital status: Married    Spouse name: Not on file  . Number of children: Not on file  . Years of education: Not on file  . Highest education level: Not on file  Occupational History   . Not on file  Social Needs  . Financial resource strain: Not on file  . Food insecurity    Worry: Not on file    Inability: Not on file  . Transportation needs    Medical: Not on file    Non-medical: Not on file  Tobacco Use  . Smoking status: Former Smoker    Packs/day: 0.50    Years: 2.00    Pack years: 1.00    Types: Cigarettes    Quit date: 01/17/1983    Years since quitting: 35.5  . Smokeless tobacco: Never Used  Substance and Sexual Activity  . Alcohol use: No  . Drug use: No  . Sexual activity: Not on file  Lifestyle  . Physical activity    Days per week: Not on file    Minutes per session: Not on file  . Stress: Not on file  Relationships  . Social Herbalist on phone: Not on file    Gets together: Not on file    Attends religious service: Not on file    Active member of club or organization: Not on file    Attends meetings of clubs or organizations: Not on file    Relationship status: Not on file  . Intimate partner violence    Fear of current or ex partner: Not on file    Emotionally abused: Not on  file    Physically abused: Not on file    Forced sexual activity: Not on file  Other Topics Concern  . Not on file  Social History Narrative  . Not on file   ROS-see HPI   +m= positive Constitutional:    weight loss, night sweats, fevers, chills, fatigue, lassitude. HEENT:    headaches, difficulty swallowing, tooth/dental problems, sore throat,       sneezing, itching, ear ache, nasal congestion, post nasal drip, snoring CV:    chest pain, orthopnea, PND, swelling in lower extremities, anasarca,                                  dizziness, palpitations Resp:   shortness of breath with exertion or at rest.                productive cough,   non-productive cough, coughing up of blood.              change in color of mucus.  wheezing.   Skin:    rash or lesions. GI:  No-   heartburn, indigestion, abdominal pain, nausea, vomiting, diarrhea,                  change in bowel habits, loss of appetite GU: dysuria, change in color of urine, no urgency or frequency.   flank pain. MS:   joint pain, stiffness, decreased range of motion, back pain. Neuro-     nothing unusual Psych:  change in mood or affect.  depression or anxiety.   memory loss.  OBJ- Physical Exam General- Alert, Oriented, Affect-appropriate, Distress- none acute Skin- rash-none, lesions- none, excoriation- none Lymphadenopathy- none Head- atraumatic            Eyes- Gross vision intact, PERRLA, conjunctivae and secretions clear            Ears- Hearing, canals-normal            Nose- Clear, no-Septal dev, mucus, polyps, erosion, perforation             Throat- Mallampati II- III, mucosa clear , drainage- none, tonsils- atrophic Neck- flexible , trachea midline, no stridor , thyroid nl, carotid no bruit Chest - symmetrical excursion , unlabored           Heart/CV- RRR , no murmur , no gallop  , no rub, nl s1 s2                           - JVD- none , edema- none, stasis changes- none, varices- none           Lung- clear to P&A, wheeze- none, cough- none , dullness-none, rub- none           Chest wall-  Abd-  Br/ Gen/ Rectal- Not done, not indicated Extrem- cyanosis- none, clubbing, none, atrophy- none, strength- nl Neuro- grossly intact to observation

## 2018-07-29 NOTE — Patient Instructions (Signed)
Order- please schedule sleep study- home sleep test or Split protocol     Dx OSA  Please call us about 2 weeks after your sleep test is done, to see if results and recommendations are ready yet. If appropriate, we may be able to start treatment before we see you next.   Please call if you have questions.

## 2018-07-29 NOTE — Assessment & Plan Note (Addendum)
He describes occasional food hang-up mid esophagus. He is careful to eat and chew slowly. Plan- Strongly advised him to discuss with his PCP, for ba swallow or GI evaluation.

## 2018-07-30 ENCOUNTER — Other Ambulatory Visit (HOSPITAL_BASED_OUTPATIENT_CLINIC_OR_DEPARTMENT_OTHER): Payer: Self-pay

## 2018-08-02 ENCOUNTER — Other Ambulatory Visit: Payer: Self-pay

## 2018-08-02 ENCOUNTER — Other Ambulatory Visit (HOSPITAL_COMMUNITY)
Admission: RE | Admit: 2018-08-02 | Discharge: 2018-08-02 | Disposition: A | Discharge status: Home / Self Care | Payer: BC Managed Care – PPO | Source: Ambulatory Visit | Attending: Internal Medicine | Admitting: Internal Medicine

## 2018-08-02 DIAGNOSIS — Z1159 Encounter for screening for other viral diseases: Secondary | ICD-10-CM | POA: Diagnosis present

## 2018-08-02 LAB — SARS CORONAVIRUS 2 (TAT 6-24 HRS): SARS Coronavirus 2: NEGATIVE

## 2018-08-06 ENCOUNTER — Ambulatory Visit: Payer: BC Managed Care – PPO | Attending: Internal Medicine | Admitting: Internal Medicine

## 2018-08-06 ENCOUNTER — Other Ambulatory Visit: Payer: Self-pay

## 2018-08-06 DIAGNOSIS — I4891 Unspecified atrial fibrillation: Secondary | ICD-10-CM | POA: Insufficient documentation

## 2018-08-06 DIAGNOSIS — G4733 Obstructive sleep apnea (adult) (pediatric): Secondary | ICD-10-CM | POA: Diagnosis not present

## 2018-08-13 ENCOUNTER — Other Ambulatory Visit: Payer: Self-pay

## 2018-08-13 ENCOUNTER — Ambulatory Visit: Payer: BC Managed Care – PPO | Admitting: Nurse Practitioner

## 2018-08-13 ENCOUNTER — Encounter: Payer: Self-pay | Admitting: Nurse Practitioner

## 2018-08-13 VITALS — BP 117/75 | HR 95 | Temp 99.5°F | Ht 72.0 in | Wt 223.0 lb

## 2018-08-13 DIAGNOSIS — R590 Localized enlarged lymph nodes: Secondary | ICD-10-CM

## 2018-08-13 MED ORDER — CEPHALEXIN 500 MG PO CAPS: 500.0000 mg | ORAL_CAPSULE | Freq: Two times a day (BID) | ORAL | 0 refills | Status: DC

## 2018-08-13 NOTE — Progress Notes (Signed)
   Subjective:    Patient ID: [name]., male    DOB: 04-22-1960, 58 y.o.   MRN: [id number]   Chief Complaint: Knot on neck   HPI Patient comes in today stating that he discovered a knot or right side of neck. Just found it yesterday . Only tender to touch. Other wise it does not bother him.    Review of Systems  Constitutional: Negative for activity change and appetite change.  HENT: Negative.   Eyes: Negative for pain.  Respiratory: Negative for shortness of breath.   Cardiovascular: Negative for chest pain, palpitations and leg swelling.  Gastrointestinal: Negative for abdominal pain.  Endocrine: Negative for polydipsia.  Genitourinary: Negative.   Skin: Negative for rash.  Neurological: Negative for dizziness, weakness and headaches.  Hematological: Does not bruise/bleed easily.  Psychiatric/Behavioral: Negative.   All other systems reviewed and are negative.      Objective:   Physical Exam Vitals signs and nursing note reviewed.  Constitutional:      Appearance: Normal appearance.  Cardiovascular:     Rate and Rhythm: Normal rate and regular rhythm.     Heart sounds: Normal heart sounds.  Pulmonary:     Effort: Pulmonary effort is normal.     Breath sounds: Normal breath sounds.  Lymphadenopathy:     Cervical: Cervical adenopathy (right posterior chain 3cm slightly tender) present.  Skin:    General: Skin is warm and dry.  Neurological:     General: No focal deficit present.     Mental Status: He is alert and oriented to person, place, and time.  Psychiatric:        Mood and Affect: Mood normal.        Behavior: Behavior normal.     BP 117/75   Pulse 95   Temp 99.5 F (37.5 C) (Oral)   Ht 6' (1.829 m)   Wt 223 lb (101.2 kg)   BMI 30.24 kg/m        Assessment & Plan:  [name]. comes in today with chief complaint of Knot on neck   Diagnosis and orders addressed:  1. Cervical lymphadenopathy Continue to wtach- if is not  resolve in 7-10 call and we will schedule ultra sound  - cephALEXin (KEFLEX) 500 MG capsule; Take 1 capsule (500 mg total) by mouth 2 (two) times daily.  Dispense: 20 capsule; Refill: 0    Follow up plan: prn   Mary-Margaret Hassell Done, FNP

## 2018-08-24 NOTE — Procedures (Signed)
 [facility] [place]    Patient Name: [name], [name] Study Date: 08/06/2018 Gender: Male D.O.B: 08/20/1960 Age (years): 57 Referring Provider: Clinton Young MD, ABSM Height (inches): 72 Interpreting Physician: Clinton Young MD, ABSM Weight (lbs): 224 RPSGT: Hedrick, Debra BMI: 30 MRN: [id number] Neck Size: 17.00  CLINICAL INFORMATION Sleep Study Type: Split Night CPAP Indication for sleep study: OSA Epworth Sleepiness Score: 4  SLEEP STUDY TECHNIQUE As per the AASM Manual for the Scoring of Sleep and Associated Events v2.3 (April 2016) with a hypopnea requiring 4% desaturations.  The channels recorded and monitored were frontal, central and occipital EEG, electrooculogram (EOG), submentalis EMG (chin), nasal and oral airflow, thoracic and abdominal wall motion, anterior tibialis EMG, snore microphone, electrocardiogram, and pulse oximetry. Continuous positive airway pressure (CPAP) was initiated when the patient met split night criteria and was titrated according to treat sleep-disordered breathing.  MEDICATIONS Medications self-administered by patient taken the night of the study : none reported  RESPIRATORY PARAMETERS Diagnostic  Total AHI (/hr): 46.8 RDI (/hr): 50.8 OA Index (/hr): 0.5 CA Index (/hr): 0.0 REM AHI (/hr): N/A NREM AHI (/hr): 46.8 Supine AHI (/hr): 22.4 Non-supine AHI (/hr): 51.3 Min O2 Sat (%): 85.0 Mean O2 (%): 91.6 Time below 88% (min): 9.8   Titration  Optimal Pressure (cm): 9 AHI at Optimal Pressure (/hr): 0.0 Min O2 at Optimal Pressure (%): 91.0 Supine % at Optimal (%): 93 Sleep % at Optimal (%): 73   SLEEP ARCHITECTURE The recording time for the entire night was 386.6 minutes.  During a baseline period of 193.4 minutes, the patient slept for 120.5 minutes in REM and nonREM, yielding a sleep efficiency of 62.3%%. Sleep onset after lights out was 14.7 minutes with a REM latency of N/A minutes. The patient spent 19.9%% of the  night in stage N1 sleep, 69.3%% in stage N2 sleep, 10.8%% in stage N3 and 0% in REM.  During the titration period of 189.1 minutes, the patient slept for 168.4 minutes in REM and nonREM, yielding a sleep efficiency of 89.0%%. Sleep onset after CPAP initiation was 6.3 minutes with a REM latency of 35.0 minutes. The patient spent 8.8%% of the night in stage N1 sleep, 37.4%% in stage N2 sleep, 28.8%% in stage N3 and 24.9% in REM.  CARDIAC DATA The 2 lead EKG demonstrated sinus rhythm. The mean heart rate was 100.0 beats per minute. Other EKG findings include: Atrial Fibrillation.  LEG MOVEMENT DATA The total Periodic Limb Movements of Sleep (PLMS) were 13. The PLMS index was 2.7 .  IMPRESSIONS - Severe obstructive sleep apnea occurred during the diagnostic portion of the study (AHI = 46.8/hour). An optimal PAP pressure was selected for this patient ( 9 cm of water) - No significant central sleep apnea occurred during the diagnostic portion of the study (CAI = 0.0/hour). - Oxygen desaturation was noted during the diagnostic portion of the study (Min O2 = 85.0%). - Minimum saturation on CPAP 9 was 91%. - The patient snored with moderate snoring volume during the diagnostic portion of the study. - EKG findings include Atrial Fibrillation identified by Tech. Unable to view graphic for confirmation. - Clinically significant periodic limb movements did not occur during sleep.  DIAGNOSIS - Obstructive Sleep Apnea (327.23 [G47.33 ICD-10])  RECOMMENDATIONS - Trial of CPAP therapy on 9 cm H2O with a Medium size Fisher&Paykel Full Face Mask Simplus mask and heated humidification. - Be careful with alcohol, sedatives and other CNS depressants that may worsen sleep apnea and   disrupt normal sleep architecture. - Sleep hygiene should be reviewed to assess factors that may improve sleep quality. - Weight management and regular exercise should be initiated or continued. - Atrial Fibrillation identified by  tech. Interpreter was unable to view graphic for confirmation.   [Electronically signed] 08/24/2018 11:32 AM  Baird Lyons MD, ABSM Diplomate, American Board of Sleep Medicine   NPI: [id number]                          [place]  ELECTRONICALLY SIGNED ON:  08/24/2018, 11:29 AM [place] PH: [phone]   FX: [phone] [place]

## 2018-08-26 ENCOUNTER — Telehealth: Payer: Self-pay | Admitting: Internal Medicine

## 2018-08-26 DIAGNOSIS — G4733 Obstructive sleep apnea (adult) (pediatric): Secondary | ICD-10-CM

## 2018-08-26 NOTE — Telephone Encounter (Signed)
Advised pt of results. Pt understood and nothing further is needed.   CY pt states he has not been told he has an irregular heartbeat or atrial fibrillation. Order placed for CPAP.

## 2018-08-26 NOTE — Telephone Encounter (Signed)
His sleep study showed severe obstructive sleep apnea, averaging 46 apneas/ hour, with drops in blood oxygen level. There was question about his heart rhythm from the technician, but I was unable to view that myself. We can follow up on that issue. Does he have a history of irregular heart beat with atrial fibrillation?  Recommend we order new DME, new CPAP auto 5-15, mask of choice, humidifier, supplies, AirView/ card Please verify that he has a f/u appointment in 31-90 days per insurance regs.

## 2018-08-26 NOTE — Telephone Encounter (Signed)
Dr. Annamaria Boots pt requesting results of sleep study. Study done 08/06/18.  IMPRESSIONS  - Severe obstructive sleep apnea occurred during the diagnostic  portion of the study (AHI = 46.8/hour). An optimal PAP pressure  was selected for this patient ( 9 cm of water)  - No significant central sleep apnea occurred during the  diagnostic portion of the study (CAI = 0.0/hour).  - Oxygen desaturation was noted during the diagnostic portion of  the study (Min O2 = 85.0%).  - Minimum saturation on CPAP 9 was 91%.  - The patient snored with moderate snoring volume during the  diagnostic portion of the study.  - EKG findings include Atrial Fibrillation identified by Tech.  Unable to view graphic for confirmation.  - Clinically significant periodic limb movements did not occur  during sleep.

## 2018-08-27 ENCOUNTER — Telehealth: Payer: Self-pay | Admitting: Family

## 2018-08-27 NOTE — Telephone Encounter (Signed)
appt scheduled Pt's wife notified

## 2018-08-28 ENCOUNTER — Ambulatory Visit: Payer: BC Managed Care – PPO | Admitting: Family Medicine

## 2018-08-28 ENCOUNTER — Encounter: Payer: Self-pay | Admitting: Family Medicine

## 2018-08-28 ENCOUNTER — Other Ambulatory Visit: Payer: Self-pay

## 2018-08-28 ENCOUNTER — Telehealth: Payer: Self-pay | Admitting: Internal Medicine

## 2018-08-28 VITALS — BP 114/72 | HR 74 | Temp 98.0°F | Ht 72.0 in | Wt 222.0 lb

## 2018-08-28 DIAGNOSIS — R590 Localized enlarged lymph nodes: Secondary | ICD-10-CM

## 2018-08-28 DIAGNOSIS — I499 Cardiac arrhythmia, unspecified: Secondary | ICD-10-CM

## 2018-08-28 NOTE — Telephone Encounter (Signed)
I had the tracing reviewed. We do not see atrial fibrillation. No concern about heart rhythm.

## 2018-08-28 NOTE — Progress Notes (Signed)
Subjective:  Patient ID: [name]., male    DOB: [birth year], 57 y.o.   MRN: [id number]  Patient Care Team: Sharion Balloon, FNP as PCP - General (Family Medicine) Iran Planas, MD as Consulting Physician (Orthopedic Surgery)   Chief Complaint:  irregular heartrate (per sleep study tech )   HPI: [name]. is a 58 y.o. male presenting on 08/28/2018 for irregular heartrate (per sleep study tech )   Pt presents today for evaluation of an irregular heart rate. Pt states he was having a sleep study done and the tech told him that they noticed an abnormal heart rate during the study. Pt did have 40 reported episodes of apnea during the study. Unsure if irregular rate correlated with the apnea. Pt denies any symptoms. Pts wife is a Marine scientist and has been checking pulse and noted some abnormalities that were not sustained.  Pt also reports he has continued swelling to lymph node in neck after treatment with Keflex, 10 day course. No fever, chills, weight loss, fatigue, or recent illness.   Palpitations  Pertinent negatives include no anxiety, chest fullness, chest pain, coughing, diaphoresis, dizziness, fever, irregular heartbeat, malaise/fatigue, nausea, near-syncope, numbness, shortness of breath, syncope, vomiting or weakness. Risk factors include obesity, dyslipidemia and being male. His past medical history is significant for heart disease.     Relevant past medical, surgical, family, and social history reviewed and updated as indicated.  Allergies and medications reviewed and updated. Date reviewed: Chart in Epic.   Past Medical History:  Diagnosis Date  . Hyperlipemia   . Hypertension    on meds for 8 years    Past Surgical History:  Procedure Laterality Date  . BACK SURGERY    . CARDIOVASCULAR STRESS TEST    . JOINT REPLACEMENT    . KNEE ARTHROSCOPY    . KNEE ARTHROSCOPY WITH PATELLAR TENDON REPAIR    . rt knee replacement  2008  . TOTAL KNEE ARTHROPLASTY   02/16/2012   Procedure: TOTAL KNEE ARTHROPLASTY;  Surgeon: Augustin Schooling, MD;  Location: [facility];  Service: Orthopedics;  Laterality: Left;    Social History   Socioeconomic History  . Marital status: Married    Spouse name: Not on file  . Number of children: Not on file  . Years of education: Not on file  . Highest education level: Not on file  Occupational History  . Not on file  Social Needs  . Financial resource strain: Not on file  . Food insecurity    Worry: Not on file    Inability: Not on file  . Transportation needs    Medical: Not on file    Non-medical: Not on file  Tobacco Use  . Smoking status: Former Smoker    Packs/day: 0.50    Years: 2.00    Pack years: 1.00    Types: Cigarettes    Quit date: 01/17/1983    Years since quitting: 35.6  . Smokeless tobacco: Never Used  Substance and Sexual Activity  . Alcohol use: No  . Drug use: No  . Sexual activity: Not on file  Lifestyle  . Physical activity    Days per week: Not on file    Minutes per session: Not on file  . Stress: Not on file  Relationships  . Social Herbalist on phone: Not on file    Gets together: Not on file    Attends religious service: Not on  file    Active member of club or organization: Not on file    Attends meetings of clubs or organizations: Not on file    Relationship status: Not on file  . Intimate partner violence    Fear of current or ex partner: Not on file    Emotionally abused: Not on file    Physically abused: Not on file    Forced sexual activity: Not on file  Other Topics Concern  . Not on file  Social History Narrative  . Not on file    Outpatient Encounter Medications as of 08/28/2018  Medication Sig  . amLODipine-benazepril (LOTREL) 5-10 MG capsule Take 1 capsule by mouth daily. (Needs to be seen before next refill)  . aspirin EC 81 MG tablet Take 81 mg by mouth daily.  . metoprolol succinate (TOPROL-XL) 25 MG 24 hr tablet Take 1 tablet (25 mg total) by  mouth daily. (Needs to be seen before next refill)  . sildenafil (REVATIO) 20 MG tablet Take 3-5 tablets daily as needed for ED  . [DISCONTINUED] cephALEXin (KEFLEX) 500 MG capsule Take 1 capsule (500 mg total) by mouth 2 (two) times daily.   No facility-administered encounter medications on file as of 08/28/2018.     No Known Allergies  Review of Systems  Constitutional: Negative for activity change, appetite change, chills, diaphoresis, fatigue, fever and malaise/fatigue.  HENT: Negative.   Eyes: Negative.  Negative for photophobia and visual disturbance.  Respiratory: Negative for cough, chest tightness, shortness of breath and wheezing.   Cardiovascular: Negative for chest pain, palpitations, leg swelling, syncope and near-syncope.  Gastrointestinal: Negative for abdominal pain, anal bleeding, blood in stool, constipation, diarrhea, nausea and vomiting.  Endocrine: Negative.  Negative for cold intolerance, heat intolerance, polydipsia, polyphagia and polyuria.  Genitourinary: Negative for difficulty urinating, dysuria, frequency, hematuria and urgency.  Musculoskeletal: Negative for arthralgias and myalgias.  Skin: Negative.  Negative for color change and pallor.  Allergic/Immunologic: Negative.   Neurological: Negative for dizziness, tremors, seizures, syncope, facial asymmetry, speech difficulty, weakness, light-headedness, numbness and headaches.  Hematological: Positive for adenopathy.  Psychiatric/Behavioral: Negative for agitation, confusion, hallucinations, sleep disturbance and suicidal ideas. The patient is not nervous/anxious.   All other systems reviewed and are negative.       Objective:  BP 114/72   Pulse 74   Temp 98 F (36.7 C)   Ht 6' (1.829 m)   Wt 222 lb (100.7 kg)   BMI 30.11 kg/m    Wt Readings from Last 3 Encounters:  08/28/18 222 lb (100.7 kg)  08/13/18 223 lb (101.2 kg)  07/29/18 224 lb 6.4 oz (101.8 kg)    Physical Exam Vitals signs and nursing  note reviewed.  Constitutional:      General: He is not in acute distress.    Appearance: Normal appearance. He is well-developed and well-groomed. He is obese. He is not ill-appearing, toxic-appearing or diaphoretic.  HENT:     Head: Normocephalic and atraumatic.     Jaw: There is normal jaw occlusion.     Right Ear: Hearing normal.     Left Ear: Hearing normal.     Nose: Nose normal.     Mouth/Throat:     Lips: Pink.     Mouth: Mucous membranes are moist.     Pharynx: Oropharynx is clear. Uvula midline.  Eyes:     General: Lids are normal.     Extraocular Movements: Extraocular movements intact.     Conjunctiva/sclera: Conjunctivae normal.  Pupils: Pupils are equal, round, and reactive to light.  Neck:     Musculoskeletal: Normal range of motion and neck supple.     Thyroid: No thyroid mass, thyromegaly or thyroid tenderness.     Vascular: No carotid bruit or JVD.     Trachea: Trachea and phonation normal.   Cardiovascular:     Rate and Rhythm: Normal rate and regular rhythm.  No extrasystoles are present.    Chest Wall: PMI is not displaced.     Pulses: Normal pulses.     Heart sounds: Normal heart sounds. No murmur. No friction rub. No gallop.   Pulmonary:     Effort: Pulmonary effort is normal. No respiratory distress.     Breath sounds: Normal breath sounds. No wheezing.  Abdominal:     General: Abdomen is protuberant. Bowel sounds are normal. There is no distension or abdominal bruit.     Palpations: Abdomen is soft. There is no hepatomegaly or splenomegaly.     Tenderness: There is no abdominal tenderness. There is no right CVA tenderness or left CVA tenderness.     Hernia: No hernia is present.  Musculoskeletal: Normal range of motion.     Right lower leg: No edema.     Left lower leg: No edema.  Lymphadenopathy:     Cervical: Cervical adenopathy present.     Right cervical: Superficial cervical adenopathy present.  Skin:    General: Skin is warm and dry.      Capillary Refill: Capillary refill takes less than 2 seconds.     Coloration: Skin is not cyanotic, jaundiced or pale.     Findings: No rash.  Neurological:     General: No focal deficit present.     Mental Status: He is alert and oriented to person, place, and time.     Cranial Nerves: Cranial nerves are intact.     Sensory: Sensation is intact.     Motor: Motor function is intact.     Coordination: Coordination is intact.     Gait: Gait is intact.     Deep Tendon Reflexes: Reflexes are normal and symmetric.  Psychiatric:        Attention and Perception: Attention and perception normal.        Mood and Affect: Mood and affect normal.        Speech: Speech normal.        Behavior: Behavior normal. Behavior is cooperative.        Thought Content: Thought content normal.        Cognition and Memory: Cognition and memory normal.        Judgment: Judgment normal.     Results for orders placed or performed during the hospital encounter of 08/02/18  SARS Coronavirus 2 (Performed in [facility] hospital lab)   Specimen: Nasal Swab  Result Value Ref Range   SARS Coronavirus 2 NEGATIVE NEGATIVE     EKG in office today: SR with 1st degree AV block, no ST changes, no ectopy. No changes from previous EKG in 2014. Monia Pouch, FNP-C  Pertinent labs & imaging results that were available during my care of the patient were reviewed by me and considered in my medical decision making.  Assessment & Plan:  Previn was seen today for irregular heartrate.  Diagnoses and all orders for this visit:  Irregular heart rate Pt asymptomatic. Will place 24 hour Holter monitor today. EKG without acute changes or new findings. Will check electrolytes, thyroid, and CBC for  possible underlying causes. Report any new or worsening symptoms. Follow up in 2 weeks or sooner if needed.  -     EKG 12-Lead -     CMP14+EGFR -     CBC with Differential/Platelet -     Thyroid Panel With TSH  Cervical  lymphadenopathy Ongoing despite 10 day course of Keflex. Will check CBC and TSH. Will get [place] of soft tissue of the neck. Report any new or worsening symptoms.  -     CBC with Differential/Platelet -     Thyroid Panel With TSH -     US Soft Tissue Head/Neck; Future     Continue all other maintenance medications.  Follow up plan: Return in about 2 weeks (around 09/11/2018), or if symptoms worsen or fail to improve, for palpitations.  Continue healthy lifestyle choices, including diet (rich in fruits, vegetables, and lean proteins, and low in salt and simple carbohydrates) and exercise (at least 30 minutes of moderate physical activity daily).  Educational handout given for palpitations   The above assessment and management plan was discussed with the patient. The patient verbalized understanding of and has agreed to the management plan. Patient is aware to call the clinic if symptoms persist or worsen. Patient is aware when to return to the clinic for a follow-up visit. Patient educated on when it is appropriate to go to the emergency department.   Monia Pouch, FNP-C [facility] [phone] 08/28/18

## 2018-08-28 NOTE — Patient Instructions (Signed)
Palpitations Palpitations are feelings that your heartbeat is not normal. Your heartbeat may feel like it is:  Uneven.  Faster than normal.  Fluttering.  Skipping a beat. This is usually not a serious problem. In some cases, you may need tests to rule out any serious problems. Follow these instructions at home: Pay attention to any changes in your condition. Take these actions to help manage your symptoms: Eating and drinking  Avoid: ? Coffee, tea, soft drinks, and energy drinks. ? Chocolate. ? Alcohol. ? Diet pills. Lifestyle   Try to lower your stress. These things can help you relax: ? Yoga. ? Deep breathing and meditation. ? Exercise. ? Using words and images to create positive thoughts (guided imagery). ? Using your mind to control things in your body (biofeedback).  Do not use drugs.  Get plenty of rest and sleep. Keep a regular bed time. General instructions   Take over-the-counter and prescription medicines only as told by your doctor.  Do not use any products that contain nicotine or tobacco, such as cigarettes and e-cigarettes. If you need help quitting, ask your doctor.  Keep all follow-up visits as told by your doctor. This is important. You may need more tests if palpitations do not go away or get worse. Contact a doctor if:  Your symptoms last more than 24 hours.  Your symptoms occur more often. Get help right away if you:  Have chest pain.  Feel short of breath.  Have a very bad headache.  Feel dizzy.  Pass out (faint). Summary  Palpitations are feelings that your heartbeat is uneven or faster than normal. It may feel like your heart is fluttering or skipping a beat.  Avoid food and drinks that may cause palpitations. These include caffeine, chocolate, and alcohol.  Try to lower your stress. Do not smoke or use drugs.  Get help right away if you faint or have chest pain, shortness of breath, a severe headache, or dizziness. This  information is not intended to replace advice given to you by your health care provider. Make sure you discuss any questions you have with your health care provider. Document Released: 10/12/2007 Document Revised: 02/14/2017 Document Reviewed: 02/14/2017 Elsevier Patient Education  2020 Elsevier Inc.  

## 2018-08-28 NOTE — Telephone Encounter (Signed)
ATC pt, no answer. Left message for pt to call back.   Deneise Lever, MD to [facility] Triage Pool    2:10 PM Note   I had the tracing reviewed. We do not see atrial fibrillation. No concern about heart rhythm.

## 2018-08-28 NOTE — Telephone Encounter (Signed)
Attempted to return call to patient regarding Dr. Janee Morn last note.  No answer but left message to call us.

## 2018-08-29 LAB — CMP14+EGFR
ALT: 20 IU/L (ref 0–44)
AST: 21 IU/L (ref 0–40)
Albumin/Globulin Ratio: 2 (ref 1.2–2.2)
Albumin: 4.6 g/dL (ref 3.8–4.9)
Alkaline Phosphatase: 83 IU/L (ref 39–117)
BUN/Creatinine Ratio: 15 (ref 9–20)
BUN: 13 mg/dL (ref 6–24)
Bilirubin Total: 0.6 mg/dL (ref 0.0–1.2)
CO2: 23 mmol/L (ref 20–29)
Calcium: 9.3 mg/dL (ref 8.7–10.2)
Chloride: 102 mmol/L (ref 96–106)
Creatinine, Ser: 0.85 mg/dL (ref 0.76–1.27)
GFR calc Af Amer: 111 mL/min/{1.73_m2} (ref >59)
GFR calc non Af Amer: 96 mL/min/{1.73_m2} (ref >59)
Globulin, Total: 2.3 g/dL (ref 1.5–4.5)
Glucose: 87 mg/dL (ref 65–99)
Potassium: 4.3 mmol/L (ref 3.5–5.2)
Sodium: 140 mmol/L (ref 134–144)
Total Protein: 6.9 g/dL (ref 6.0–8.5)

## 2018-08-29 LAB — CBC WITH DIFFERENTIAL/PLATELET
Basophils Absolute: 0.1 10*3/uL (ref 0.0–0.2)
Basos: 1 %
EOS (ABSOLUTE): 0.1 10*3/uL (ref 0.0–0.4)
Eos: 1 %
Hematocrit: 46.3 % (ref 37.5–51.0)
Hemoglobin: 15.7 g/dL (ref 13.0–17.7)
Immature Grans (Abs): 0 10*3/uL (ref 0.0–0.1)
Immature Granulocytes: 0 %
Lymphocytes Absolute: 2.3 10*3/uL (ref 0.7–3.1)
Lymphs: 26 %
MCH: 30 pg (ref 26.6–33.0)
MCHC: 33.9 g/dL (ref 31.5–35.7)
MCV: 88 fL (ref 79–97)
Monocytes Absolute: 0.9 10*3/uL (ref 0.1–0.9)
Monocytes: 10 %
Neutrophils Absolute: 5.7 10*3/uL (ref 1.4–7.0)
Neutrophils: 62 %
Platelets: 252 10*3/uL (ref 150–450)
RBC: 5.24 x10E6/uL (ref 4.14–5.80)
RDW: 12.9 % (ref 11.6–15.4)
WBC: 9.1 10*3/uL (ref 3.4–10.8)

## 2018-08-29 LAB — THYROID PANEL WITH TSH
Free Thyroxine Index: 2.4 (ref 1.2–4.9)
T3 Uptake Ratio: 27 % (ref 24–39)
T4, Total: 8.9 ug/dL (ref 4.5–12.0)
TSH: 1.79 u[IU]/mL (ref 0.450–4.500)

## 2018-08-29 NOTE — Telephone Encounter (Signed)
Spoke with pt. He is aware of results. Nothing further was needed. 

## 2018-08-29 NOTE — Telephone Encounter (Signed)
LMTCB x2 for pt 

## 2018-08-30 ENCOUNTER — Other Ambulatory Visit: Payer: Self-pay | Admitting: Family Medicine

## 2018-08-30 ENCOUNTER — Encounter: Payer: Self-pay | Admitting: Family

## 2018-08-30 DIAGNOSIS — I499 Cardiac arrhythmia, unspecified: Secondary | ICD-10-CM

## 2018-08-30 DIAGNOSIS — R9431 Abnormal electrocardiogram [ECG] [EKG]: Secondary | ICD-10-CM

## 2018-08-30 NOTE — Progress Notes (Signed)
Pt called and made aware of Holter results and that referral was placed to cardiology.

## 2018-09-02 ENCOUNTER — Telehealth: Payer: Self-pay | Admitting: *Deleted

## 2018-09-02 NOTE — Telephone Encounter (Signed)
Left a message for the patient to call back to schedule a virtual visit with Dr. Sallyanne Kuster on 8/28.

## 2018-09-02 NOTE — Telephone Encounter (Signed)
The patient has been added to 09/13/2018 at 10:20 for a virtual appointment.     Virtual Visit Pre-Appointment Phone Call  "(Name), I am calling you today to discuss your upcoming appointment. We are currently trying to limit exposure to the virus that causes COVID-19 by seeing patients at home rather than in the office."  1. "What is the BEST phone number to call the day of the visit?" - include this in appointment notes  2. "Do you have or have access to (through a family member/friend) a smartphone with video capability that we can use for your visit?" a. If yes - list this number in appt notes as "cell" (if different from BEST phone #) and list the appointment type as a VIDEO visit in appointment notes b. If no - list the appointment type as a PHONE visit in appointment notes  3. Confirm consent - "In the setting of the current Covid19 crisis, you are scheduled for a (phone or video) visit with your provider on (date) at (time).  Just as we do with many in-office visits, in order for you to participate in this visit, we must obtain consent.  If you'd like, I can send this to your mychart (if signed up) or email for you to review.  Otherwise, I can obtain your verbal consent now.  All virtual visits are billed to your insurance company just like a normal visit would be.  By agreeing to a virtual visit, we'd like you to understand that the technology does not allow for your provider to perform an examination, and thus may limit your provider's ability to fully assess your condition. If your provider identifies any concerns that need to be evaluated in person, we will make arrangements to do so.  Finally, though the technology is pretty good, we cannot assure that it will always work on either your or our end, and in the setting of a video visit, we may have to convert it to a phone-only visit.  In either situation, we cannot ensure that we have a secure connection.  Are you willing to proceed?" YES*   4. Advise patient to be prepared - "Two hours prior to your appointment, go ahead and check your blood pressure, pulse, oxygen saturation, and your weight (if you have the equipment to check those) and write them all down. When your visit starts, your provider will ask you for this information. If you have an Apple Watch or Kardia device, please plan to have heart rate information ready on the day of your appointment. Please have a pen and paper handy nearby the day of the visit as well."  5. Give patient instructions for MyChart download to smartphone OR Doximity/Doxy.me as below if video visit (depending on what platform provider is using)  6. Inform patient they will receive a phone call 15 minutes prior to their appointment time (may be from unknown caller ID) so they should be prepared to answer    Nicholas Alta Steely Jr. has been deemed a candidate for a follow-up tele-health visit to limit community exposure during the Covid-19 pandemic. I spoke with the patient via phone to ensure availability of phone/video source, confirm preferred email & phone number, and discuss instructions and expectations.  I reminded [name]. to be prepared with any vital sign and/or heart rhythm information that could potentially be obtained via home monitoring, at the time of his visit. I reminded [name]. to expect  a phone call prior to his visit.  [name], RN 09/02/2018 12:09 PM   INSTRUCTIONS FOR DOWNLOADING THE MYCHART APP TO SMARTPHONE  - The patient must first make sure to have activated MyChart and know their login information - If Apple, go to [employer] and type in MyChart in the search bar and download the app. If Android, ask patient to go to [employer] and type in [place] in the search bar and download the app. The app is free but as with any other app downloads, their phone may require them to verify saved payment information or Apple/Android  password.  - The patient will need to then log into the app with their MyChart username and password, and select [facility] as their healthcare provider to link the account. When it is time for your visit, go to the MyChart app, find appointments, and click Begin Video Visit. Be sure to Select Allow for your device to access the Microphone and Camera for your visit. You will then be connected, and your provider will be with you shortly.  **If they have any issues connecting, or need assistance please contact MyChart service desk [phone])**  **If using a computer, in order to ensure the best quality for their visit they will need to use either of the following Internet Browsers: [employer], or Google Chrome**  IF USING DOXIMITY or DOXY.ME - The patient will receive a link just prior to their visit by text.     FULL LENGTH CONSENT FOR TELE-HEALTH VISIT   I hereby voluntarily request, consent and authorize [facility] and its employed or contracted physicians, physician assistants, nurse practitioners or other licensed health care professionals (the Practitioner), to provide me with telemedicine health care services (the "Services") as deemed necessary by the treating Practitioner. I acknowledge and consent to receive the Services by the Practitioner via telemedicine. I understand that the telemedicine visit will involve communicating with the Practitioner through live audiovisual communication technology and the disclosure of certain medical information by electronic transmission. I acknowledge that I have been given the opportunity to request an in-person assessment or other available alternative prior to the telemedicine visit and am voluntarily participating in the telemedicine visit.  I understand that I have the right to withhold or withdraw my consent to the use of telemedicine in the course of my care at any time, without affecting my right to future care or treatment,  and that the Practitioner or I may terminate the telemedicine visit at any time. I understand that I have the right to inspect all information obtained and/or recorded in the course of the telemedicine visit and may receive copies of available information for a reasonable fee.  I understand that some of the potential risks of receiving the Services via telemedicine include:  Marland Kitchen Delay or interruption in medical evaluation due to technological equipment failure or disruption; . Information transmitted may not be sufficient (e.g. poor resolution of images) to allow for appropriate medical decision making by the Practitioner; and/or  . In rare instances, security protocols could fail, causing a breach of personal health information.  Furthermore, I acknowledge that it is my responsibility to provide information about my medical history, conditions and care that is complete and accurate to the best of my ability. I acknowledge that Practitioner's advice, recommendations, and/or decision may be based on factors not within their control, such as incomplete or inaccurate data provided by me or distortions of diagnostic images or specimens that  may result from electronic transmissions. I understand that the practice of medicine is not an exact science and that Practitioner makes no warranties or guarantees regarding treatment outcomes. I acknowledge that I will receive a copy of this consent concurrently upon execution via email to the email address I last provided but may also request a printed copy by calling the office of [facility].    I understand that my insurance will be billed for this visit.   I have read or had this consent read to me. . I understand the contents of this consent, which adequately explains the benefits and risks of the Services being provided via telemedicine.  . I have been provided ample opportunity to ask questions regarding this consent and the Services and have had my questions  answered to my satisfaction. . I give my informed consent for the services to be provided through the use of telemedicine in my medical care  By participating in this telemedicine visit I agree to the above.

## 2018-09-04 ENCOUNTER — Other Ambulatory Visit: Payer: Self-pay

## 2018-09-04 ENCOUNTER — Ambulatory Visit (HOSPITAL_COMMUNITY)
Admission: RE | Admit: 2018-09-04 | Discharge: 2018-09-04 | Disposition: A | Discharge status: Home / Self Care | Payer: BC Managed Care – PPO | Source: Ambulatory Visit | Attending: Family Medicine | Admitting: Family Medicine

## 2018-09-04 DIAGNOSIS — R590 Localized enlarged lymph nodes: Secondary | ICD-10-CM | POA: Diagnosis not present

## 2018-09-13 ENCOUNTER — Telehealth (INDEPENDENT_AMBULATORY_CARE_PROVIDER_SITE_OTHER): Payer: BC Managed Care – PPO | Admitting: Cardiovascular Disease

## 2018-09-13 VITALS — Ht 72.0 in | Wt 214.0 lb

## 2018-09-13 DIAGNOSIS — I4891 Unspecified atrial fibrillation: Secondary | ICD-10-CM

## 2018-09-13 DIAGNOSIS — Z6832 Body mass index (BMI) 32.0-32.9, adult: Secondary | ICD-10-CM

## 2018-09-13 DIAGNOSIS — I480 Paroxysmal atrial fibrillation: Secondary | ICD-10-CM | POA: Diagnosis not present

## 2018-09-13 DIAGNOSIS — G4733 Obstructive sleep apnea (adult) (pediatric): Secondary | ICD-10-CM

## 2018-09-13 DIAGNOSIS — I10 Essential (primary) hypertension: Secondary | ICD-10-CM

## 2018-09-13 DIAGNOSIS — E669 Obesity, unspecified: Secondary | ICD-10-CM

## 2018-09-13 DIAGNOSIS — E7800 Pure hypercholesterolemia, unspecified: Secondary | ICD-10-CM

## 2018-09-13 DIAGNOSIS — E785 Hyperlipidemia, unspecified: Secondary | ICD-10-CM

## 2018-09-13 NOTE — Progress Notes (Signed)
Virtual Visit via Telephone Note   This visit type was conducted due to national recommendations for restrictions regarding the COVID-19 Pandemic (e.g. social distancing) in an effort to limit this patient's exposure and mitigate transmission in our community.  Due to his co-morbid illnesses, this patient is at least at moderate risk for complications without adequate follow up.  This format is felt to be most appropriate for this patient at this time.  The patient did not have access to video technology/had technical difficulties with video requiring transitioning to audio format only (telephone).  All issues noted in this document were discussed and addressed.  No physical exam could be performed with this format.  Please refer to the patient's chart for his  consent to telehealth for [facility].   Date:  09/13/2018   ID:  [name]., DOB [birth year], MRN [id number]  Patient Location: Home Provider Location: Home  PCP:  Sharion Balloon, FNP  Cardiologist:  No primary care provider on file. NEW Electrophysiologist:  None   Evaluation Performed:  New Patient Evaluation  Chief Complaint:  AFib  History of Present Illness:    Nicholas Schmoldt. is a 58 y.o. male with a history of hypertension and hyperlipidemia and recently diagnosed obstructive sleep apnea, referred for asymptomatic paroxysmal atrial fibrillation.  [name] was referred for a sleep study due to heavy snoring and daytime hypersomnolence and during his study was found to have bursts of paroxysmal atrial fibrillation.  A subsequent 24-hour Holter monitor showed that he was in atrial fibrillation with well-controlled ventricular rate for about 69% of the recording.  Often the atrial activity appears organized in a pattern consistent with atrial flutter.  The arrhythmia began during sleep when he was relatively bradycardic in the 40s.  During daytime activity his average heart rate was in the 80s, peaked at 111 bpm.  The  fastest beat to beat RR interval calculated to 140 bpm, but the average ventricular rate was very well controlled.  His ECG in sinus rhythm is normal.  A remote echocardiogram in 2008 showed normal left ventricular systolic function and normal left atrial size.  There was mild left ventricular hypertrophy and mild diastolic dysfunction.  A nuclear stress test in 2006 showed normal perfusion and an ejection fraction of 57%.  In 2008 he had a cardiac catheterization that showed normal coronary arteries.  This was performed after an episode of roughly 1 hour of chest pressure associated with subtle ST segment elevation in leads V1-V2 and a mild elevation in cardiac CK-MB.  There may have been 1 very small vessel with delayed filling, but there was no real evidence of atherosclerosis or any meaningful stenoses.  He does not have a history of stroke or TIA or other embolic events.  He is physically active and performs intense activity including mowing lawns with a push mower without limitations.  He denies shortness of breath or chest pain with activity.  He has never experienced syncope and is not aware of any palpitations.  He denies intermittent claudication or leg swelling.  He has not had problems with falls, injuries or bleeding.  Recent TSH was normal.  Since the diagnosis of obstructive sleep apnea he has been using CPAP.  He was skeptical of it at first but has become quickly accommodated to it and finds that he sleeps a lot better and wakes up more energetic.  He has already lost 10 pounds and is no longer obese, BMI has dropped to  29.  His hypertension is well controlled on metoprolol succinate, amlodipine, benazepril.  He does not drink excessive alcohol.  The patient does not have symptoms concerning for COVID-19 infection (fever, chills, cough, or new shortness of breath).    Past Medical History:  Diagnosis Date   Hyperlipemia    Hypertension    on meds for 8 years   Past Surgical  History:  Procedure Laterality Date   BACK SURGERY     CARDIOVASCULAR STRESS TEST     JOINT REPLACEMENT     KNEE ARTHROSCOPY     KNEE ARTHROSCOPY WITH PATELLAR TENDON REPAIR     rt knee replacement  2008   TOTAL KNEE ARTHROPLASTY  02/16/2012   Procedure: TOTAL KNEE ARTHROPLASTY;  Surgeon: Augustin Schooling, MD;  Location: [facility];  Service: Orthopedics;  Laterality: Left;     Current Meds  Medication Sig   amLODipine-benazepril (LOTREL) 5-10 MG capsule Take 1 capsule by mouth daily. (Needs to be seen before next refill)   aspirin EC 81 MG tablet Take 81 mg by mouth daily.   metoprolol succinate (TOPROL-XL) 25 MG 24 hr tablet Take 1 tablet (25 mg total) by mouth daily. (Needs to be seen before next refill)   sildenafil (REVATIO) 20 MG tablet Take 3-5 tablets daily as needed for ED     Allergies:   Patient has no known allergies.   Social History   Tobacco Use   Smoking status: Former Smoker    Packs/day: 0.50    Years: 2.00    Pack years: 1.00    Types: Cigarettes    Quit date: 01/17/1983    Years since quitting: 35.6   Smokeless tobacco: Never Used  Substance Use Topics   Alcohol use: No   Drug use: No     Family Hx: The patient's family history is reviewed and is significantly negative for arrhythmia, premature onset cardiovascular illness or death. ROS:   Please see the history of present illness.     All other systems reviewed and are negative.   Prior CV studies:   The following studies were reviewed today: Coronary angiography 2008.  Echocardiogram 2008 Nuclear stress test 2006. 24-hour Holter monitor August 30, 2018 Multiple ECG tracings, most recent August 28, 2018  Labs/Other Tests and Data Reviewed:    EKG:  An ECG dated August 28, 2018 was personally reviewed today and demonstrated:  Normal sinus rhythm, normal tracing, rSr' pattern in lead V1 with normal duration QRS, QTC 414, no repolarization abnormalities  Recent Labs: 08/28/2018: ALT 20;  BUN 13; Creatinine, Ser 0.85; Hemoglobin 15.7; Platelets 252; Potassium 4.3; Sodium 140; TSH 1.790   Recent Lipid Panel Lab Results  Component Value Date/Time   CHOL 174 01/07/2018 09:08 AM   CHOL 172 05/31/2012 08:41 AM   TRIG 138 01/07/2018 09:08 AM   TRIG 335 (H) 04/29/2013 04:35 PM   TRIG 107 05/31/2012 08:41 AM   HDL 38 (L) 01/07/2018 09:08 AM   HDL 31 (L) 04/29/2013 04:35 PM   HDL 48 05/31/2012 08:41 AM   CHOLHDL 4.6 01/07/2018 09:08 AM   CHOLHDL 4.5 11/14/2006 03:10 AM   LDLCALC 108 (H) 01/07/2018 09:08 AM   LDLCALC 95 04/29/2013 04:35 PM   LDLCALC 103 (H) 05/31/2012 08:41 AM    Wt Readings from Last 3 Encounters:  09/13/18 214 lb (97.1 kg)  08/28/18 222 lb (100.7 kg)  08/13/18 223 lb (101.2 kg)     Objective:    Vital Signs:  Ht 6' (  1.829 m)    Wt 214 lb (97.1 kg)    BMI 29.02 kg/m    VITAL SIGNS:  reviewed Unable to examine  ASSESSMENT & PLAN:    1. AFib: Asymptomatic and well rate controlled (on beta-blocker for hypertension already). CHADSVasc 1 (HTN).  Aspirin 81 mg daily is appropriate.  I asked him to call us immediately if he develops even transient neurological complaints: that would change the indication for full anticoagulation.  Also reviewed the fact that as he gets older or if he develops new comorbid conditions, anticoagulation might be indicated.  I think it is worthwhile updating his echocardiogram to look at atrial sizes and whether or not he has developed any valvular heart disease, especially since a physical exam was not possible today. 2. OSA: This is quite possibly the underlying substrate for his arrhythmia.  He is compliant with CPAP and has already noticed benefit.  Treating the sleep apnea should lead to a reduction in the overall burden of atrial fibrillation. 3. Overweight: Congratulated him on weight loss.  Similarly, becoming leaner movements on the overall prevalence of arrhythmia. 4. HTN: Well-controlled.  The beta-blocker should be  maintained as part of his antihypertensive regimen, since it is also providing good ventricular rate control during paroxysmal atrial fibrillation.  5. History of NSTEMI: I have reviewed the records from his episode of myocardial infarction in 2018 great detail.  He had a mild elevation in cardiac biomarkers.  His coronary angiogram does not show any convincing evidence of atherosclerosis.  While the mechanism of the infarction is not immediately clear, I do not think it qualifies him as having "CAD".  Aggressive risk factor modification remains indicated. 6. HLP: Pattern is primarily of obesity-related  dyslipidemia with a low HDL and borderline LDL cholesterol.  I do not think a statin is indicated at this time, but encouraged him to continue losing weight and follow a heart healthy diet.  He exercises regularly because of his profession.  COVID-19 Education: The signs and symptoms of COVID-19 were discussed with the patient and how to seek care for testing (follow up with PCP or arrange E-visit).  The importance of social distancing was discussed today.  Time:   Today, I have spent 25 minutes with the patient with telehealth technology discussing the above problems.     Medication Adjustments/Labs and Tests Ordered: Current medicines are reviewed at length with the patient today.  Concerns regarding medicines are outlined above.   Tests Ordered: Orders Placed This Encounter  Procedures   ECHOCARDIOGRAM COMPLETE    Medication Changes: No orders of the defined types were placed in this encounter.   Follow Up:  In Person 1 year  Signed, Sanda Klein, MD  09/13/2018 12:26 PM    [place]

## 2018-09-13 NOTE — Patient Instructions (Signed)
Medication Instructions:  Your physician recommends that you continue on your current medications as directed. Please refer to the Current Medication list given to you today.  If you need a refill on your cardiac medications before your next appointment, please call your pharmacy.   Lab work: None ordered If you have labs (blood work) drawn today and your tests are completely normal, you will receive your results only by: MyChart Message (if you have MyChart) OR A paper copy in the mail If you have any lab test that is abnormal or we need to change your treatment, we will call you to review the results.  Testing/Procedures: Your physician has requested that you have an echocardiogram. Echocardiography is a painless test that uses sound waves to create images of your heart. It provides your doctor with information about the size and shape of your heart and how well your heart's chambers and valves are working. You may receive an ultrasound enhancing agent through an IV if needed to better visualize your heart during the echo.This procedure takes approximately one hour. There are no restrictions for this procedure. This will take place at the [address], [address].    Follow-Up: At [facility], you and your health needs are our priority.  As part of our continuing mission to provide you with exceptional heart care, we have created designated Provider Care Teams.  These Care Teams include your primary Cardiologist (physician) and Advanced Practice Providers (APPs -  Physician Assistants and Nurse Practitioners) who all work together to provide you with the care you need, when you need it. You will need a follow up appointment in 12 months.  Please call our office 2 months in advance to schedule this appointment.  You may see Mihai Croitoru, MD or one of the following Advanced Practice Providers on your designated Care Team: Hao Meng, PA-C Angela Duke, PA-C       

## 2018-09-17 ENCOUNTER — Other Ambulatory Visit: Payer: Self-pay

## 2018-09-17 ENCOUNTER — Ambulatory Visit (HOSPITAL_COMMUNITY): Payer: BC Managed Care – PPO | Attending: Cardiovascular Disease

## 2018-09-17 DIAGNOSIS — G4733 Obstructive sleep apnea (adult) (pediatric): Secondary | ICD-10-CM

## 2018-09-17 DIAGNOSIS — I4891 Unspecified atrial fibrillation: Secondary | ICD-10-CM | POA: Diagnosis not present

## 2018-10-01 ENCOUNTER — Encounter: Payer: Self-pay | Admitting: Internal Medicine

## 2018-11-18 ENCOUNTER — Other Ambulatory Visit: Payer: Self-pay

## 2018-11-18 ENCOUNTER — Ambulatory Visit: Payer: BC Managed Care – PPO | Admitting: Internal Medicine

## 2018-11-18 ENCOUNTER — Encounter: Payer: Self-pay | Admitting: Internal Medicine

## 2018-11-18 DIAGNOSIS — G4733 Obstructive sleep apnea (adult) (pediatric): Secondary | ICD-10-CM

## 2018-11-18 DIAGNOSIS — Z720 Tobacco use: Secondary | ICD-10-CM | POA: Diagnosis not present

## 2018-11-18 NOTE — Progress Notes (Signed)
HPI M smoker followed for OSA, complicated by CAD/ MI, HBP, PAFib, Hyperlipidemia, Obesity, Osteoarthritis NPSG 08/06/2018-  AHI 46.8/ hr, desaturation to 85%, CPAP titrated to 9,   ----------------------------------------------------------------------------------  07/29/2018- 57 yoM former smoker for sleep evaluation. referred by PCP [name], FNP) for snoring and daytime sleepiness Medical problem list includes  CAD/ MI, HBP, Hyperlipidemia, Obesity, Osteoarthritis Body weight today 224 lbs Epworth score 6 Daughter 23 yo died of seizure last year- grief impacts sleep. Wife, nurse, tells him of loud snore and witnessed apnea.  No sleep med, 1 cup coffee and 1 soft drink.  ENT + septoplasty. Still has tonsils.  Occasional food hang-up mid sternal level. No hx significant GERD.  11/18/2018- 1 yo M former smoker followed for OSA, complicated by CAD/ MI, HBP, PAFib, Hyperlipidemia, Obesity, Osteoarthritis 3 month f/u OSA NPSG 08/06/2018-  AHI 46.8/ hr, desaturation to 85%, CPAP titrated to 9,  Body weight 224 lbs Body weight today 222 lbs CPAP auto 5-20/ [facility] Download compliance 100%, AHI 1.3/ hr He reports easy adjustment to CPAP. Sleeping well. Wife tell him he is quiet.  Continues to smoke. Denies cough, wheeze, SOB.  ROS-see HPI   + = positive Constitutional:    weight loss, night sweats, fevers, chills, fatigue, lassitude. HEENT:    headaches, difficulty swallowing, tooth/dental problems, sore throat,       sneezing, itching, ear ache, nasal congestion, post nasal drip, snoring CV:    chest pain, orthopnea, PND, swelling in lower extremities, anasarca,                                  dizziness, palpitations Resp:   shortness of breath with exertion or at rest.                productive cough,   non-productive cough, coughing up of blood.              change in color of mucus.  wheezing.   Skin:    rash or lesions. GI:  No-   heartburn, indigestion, abdominal pain,  nausea, vomiting, diarrhea,                 change in bowel habits, loss of appetite GU: dysuria, change in color of urine, no urgency or frequency.   flank pain. MS:   joint pain, stiffness, decreased range of motion, back pain. Neuro-     nothing unusual Psych:  change in mood or affect.  depression or anxiety.   memory loss.  OBJ- Physical Exam General- Alert, Oriented, Affect-appropriate, Distress- none acute Skin- rash-none, lesions- none, excoriation- none Lymphadenopathy- none Head- atraumatic            Eyes- Gross vision intact, PERRLA, conjunctivae and secretions clear            Ears- Hearing, canals-normal            Nose- Clear, no-Septal dev, mucus, polyps, erosion, perforation             Throat- Mallampati II- III, mucosa clear , drainage- none, tonsils- atrophic Neck- flexible , trachea midline, no stridor , thyroid nl, carotid no bruit Chest - symmetrical excursion , unlabored           Heart/CV- RRR , no murmur , no gallop  , no rub, nl s1 s2                           -  JVD- none , edema- none, stasis changes- none, varices- none           Lung- clear to P&A, wheeze- none, cough- none , dullness-none, rub- none           Chest wall-  Abd-  Br/ Gen/ Rectal- Not done, not indicated Extrem- cyanosis- none, clubbing, none, atrophy- none, strength- nl Neuro- grossly intact to observation

## 2018-11-18 NOTE — Patient Instructions (Signed)
We can continue CPAP auto 5-20, mask of choice, humidifier,supplies, Airview/ card  Please call if we can help 

## 2018-11-19 DIAGNOSIS — Z87891 Personal history of nicotine dependence: Secondary | ICD-10-CM | POA: Insufficient documentation

## 2018-11-19 DIAGNOSIS — Z720 Tobacco use: Secondary | ICD-10-CM | POA: Insufficient documentation

## 2018-11-19 NOTE — Assessment & Plan Note (Signed)
Benefits from CPAP with good compliance and control Plan- continue CPAP auto 5-20 

## 2018-11-19 NOTE — Assessment & Plan Note (Addendum)
Pointed to cardiac hx and strongly advise he stay off tobacco

## 2019-01-13 ENCOUNTER — Telehealth: Payer: Self-pay | Admitting: Family

## 2019-01-13 MED ORDER — AMLODIPINE BESY-BENAZEPRIL HCL 5-10 MG PO CAPS: 1.0000 | ORAL_CAPSULE | Freq: Every day | ORAL | 0 refills | Status: DC

## 2019-01-13 NOTE — Telephone Encounter (Signed)
Meds sent in

## 2019-01-13 NOTE — Telephone Encounter (Signed)
What is the name of the medication? BP Medication  Have you contacted your pharmacy to request a refill? yes  Which pharmacy would you like this sent to? [facility] has appt with Christy on 01/29/19 will run out in 4 days   Patient notified that their request is being sent to the clinical staff for review and that they should receive a call once it is complete. If they do not receive a call within 24 hours they can check with their pharmacy or our office.

## 2019-01-14 ENCOUNTER — Other Ambulatory Visit: Payer: Self-pay | Admitting: Family

## 2019-01-28 ENCOUNTER — Other Ambulatory Visit: Payer: Self-pay

## 2019-01-29 ENCOUNTER — Telehealth: Payer: Self-pay | Admitting: Family

## 2019-01-29 ENCOUNTER — Encounter: Payer: Self-pay | Admitting: Family

## 2019-01-29 ENCOUNTER — Ambulatory Visit (INDEPENDENT_AMBULATORY_CARE_PROVIDER_SITE_OTHER): Payer: BC Managed Care – PPO | Admitting: Family

## 2019-01-29 VITALS — BP 134/78 | HR 61 | Temp 98.4°F | Ht 72.0 in | Wt 229.0 lb

## 2019-01-29 DIAGNOSIS — E785 Hyperlipidemia, unspecified: Secondary | ICD-10-CM

## 2019-01-29 DIAGNOSIS — I10 Essential (primary) hypertension: Secondary | ICD-10-CM | POA: Diagnosis not present

## 2019-01-29 DIAGNOSIS — Z6832 Body mass index (BMI) 32.0-32.9, adult: Secondary | ICD-10-CM

## 2019-01-29 DIAGNOSIS — G4733 Obstructive sleep apnea (adult) (pediatric): Secondary | ICD-10-CM

## 2019-01-29 DIAGNOSIS — Z0000 Encounter for general adult medical examination without abnormal findings: Secondary | ICD-10-CM

## 2019-01-29 DIAGNOSIS — E669 Obesity, unspecified: Secondary | ICD-10-CM

## 2019-01-29 MED ORDER — AMLODIPINE BESY-BENAZEPRIL HCL 5-10 MG PO CAPS: 1.0000 | ORAL_CAPSULE | Freq: Every day | ORAL | 4 refills | Status: DC

## 2019-01-29 MED ORDER — METOPROLOL SUCCINATE ER 25 MG PO TB24: ORAL_TABLET | ORAL | 4 refills | Status: DC

## 2019-01-29 NOTE — Patient Instructions (Signed)

## 2019-01-29 NOTE — Telephone Encounter (Signed)
Pharmacy was questioning note on rx. Advised that the 10 note was old and to go ahead and fill as prescribed today.

## 2019-01-29 NOTE — Progress Notes (Signed)
Subjective:    Patient ID: [name]., male    DOB: [birth year], 59 y.o.   MRN: [id number]  Chief Complaint  Patient presents with  . Medical Management of Chronic Issues   PT presents to the office today for CPE. Pt is followed by Cardiologists annually for hx of MI.  Hypertension This is a chronic problem. The current episode started more than 1 year ago. The problem has been resolved since onset. The problem is controlled. Pertinent negatives include no malaise/fatigue, peripheral edema or shortness of breath. Risk factors for coronary artery disease include male gender. The current treatment provides moderate improvement. Hypertensive end-organ damage includes CAD/MI.  OSA Uses CPAP nightly. Stable.    Review of Systems  Constitutional: Negative for malaise/fatigue.  Respiratory: Negative for shortness of breath.   All other systems reviewed and are negative.  History reviewed. No pertinent family history.  Social History   Socioeconomic History  . Marital status: Married    Spouse name: Not on file  . Number of children: Not on file  . Years of education: Not on file  . Highest education level: Not on file  Occupational History  . Not on file  Tobacco Use  . Smoking status: Former Smoker    Packs/day: 0.50    Years: 2.00    Pack years: 1.00    Types: Cigarettes    Quit date: 01/17/1983    Years since quitting: 36.0  . Smokeless tobacco: Never Used  Substance and Sexual Activity  . Alcohol use: No  . Drug use: No  . Sexual activity: Not on file  Other Topics Concern  . Not on file  Social History Narrative  . Not on file   Social Determinants of Health   Financial Resource Strain:   . Difficulty of Paying Living Expenses: Not on file  Food Insecurity:   . Worried About Charity fundraiser in the Last Year: Not on file  . Ran Out of Food in the Last Year: Not on file  Transportation Needs:   . Lack of Transportation (Medical): Not on file  . Lack  of Transportation (Non-Medical): Not on file  Physical Activity:   . Days of Exercise per Week: Not on file  . Minutes of Exercise per Session: Not on file  Stress:   . Feeling of Stress : Not on file  Social Connections:   . Frequency of Communication with Friends and Family: Not on file  . Frequency of Social Gatherings with Friends and Family: Not on file  . Attends Religious Services: Not on file  . Active Member of Clubs or Organizations: Not on file  . Attends Archivist Meetings: Not on file  . Marital Status: Not on file       Objective:   Physical Exam Vitals reviewed.  Constitutional:      General: He is not in acute distress.    Appearance: He is well-developed.  HENT:     Head: Normocephalic.     Right Ear: Tympanic membrane normal.     Left Ear: Tympanic membrane normal.  Eyes:     General:        Right eye: No discharge.        Left eye: No discharge.     Pupils: Pupils are equal, round, and reactive to light.  Neck:     Thyroid: No thyromegaly.  Cardiovascular:     Rate and Rhythm: Normal rate and regular rhythm.  Heart sounds: Normal heart sounds. No murmur.  Pulmonary:     Effort: Pulmonary effort is normal. No respiratory distress.     Breath sounds: Normal breath sounds. No wheezing.  Abdominal:     General: Bowel sounds are normal. There is no distension.     Palpations: Abdomen is soft.     Tenderness: There is no abdominal tenderness.  Musculoskeletal:        General: No tenderness. Normal range of motion.     Cervical back: Normal range of motion and neck supple.  Skin:    General: Skin is warm and dry.     Findings: No erythema or rash.  Neurological:     Mental Status: He is alert and oriented to person, place, and time.     Cranial Nerves: No cranial nerve deficit.     Deep Tendon Reflexes: Reflexes are normal and symmetric.  Psychiatric:        Behavior: Behavior normal.        Thought Content: Thought content normal.          Judgment: Judgment normal.       BP 134/78   Pulse 61   Temp 98.4 F (36.9 C) (Temporal)   Ht 6' (1.829 m)   Wt 229 lb (103.9 kg)   SpO2 98%   BMI 31.06 kg/m      Assessment & Plan:  [name]. comes in today with chief complaint of Annual Exam   Diagnosis and orders addressed:  1. Annual physical exam - CMP14+EGFR; Future - CBC with Differential/Platelet; Future - Lipid panel; Future - TSH; Future - PSA, total and free; Future  2. Essential hypertension - CMP14+EGFR; Future - CBC with Differential/Platelet; Future - metoprolol succinate (TOPROL-XL) 25 MG 24 hr tablet; TAKE 1 TABLET BY MOUTH ONCE DAILY.  Dispense: 90 tablet; Refill: 4 - amLODipine-benazepril (LOTREL) 5-10 MG capsule; Take 1 capsule by mouth daily. (Needs to be seen before next refill)  Dispense: 90 capsule; Refill: 4  3. OSA (obstructive sleep apnea) - CMP14+EGFR; Future - CBC with Differential/Platelet; Future  4. Hyperlipidemia, unspecified hyperlipidemia type - CMP14+EGFR; Future - CBC with Differential/Platelet; Future - Lipid panel; Future  5. Class 1 obesity with serious comorbidity and body mass index (BMI) of 32.0 to 32.9 in adult, unspecified obesity type - CMP14+EGFR; Future - CBC with Differential/Platelet; Future   Labs pending Health Maintenance reviewed Diet and exercise encouraged  Follow up plan: 1 year    [name], FNP

## 2019-01-31 ENCOUNTER — Other Ambulatory Visit: Payer: BC Managed Care – PPO

## 2019-01-31 ENCOUNTER — Other Ambulatory Visit: Payer: Self-pay

## 2019-01-31 DIAGNOSIS — I10 Essential (primary) hypertension: Secondary | ICD-10-CM

## 2019-01-31 DIAGNOSIS — E785 Hyperlipidemia, unspecified: Secondary | ICD-10-CM

## 2019-01-31 DIAGNOSIS — E66811 Obesity, class 1: Secondary | ICD-10-CM

## 2019-01-31 DIAGNOSIS — Z0000 Encounter for general adult medical examination without abnormal findings: Secondary | ICD-10-CM

## 2019-01-31 DIAGNOSIS — E669 Obesity, unspecified: Secondary | ICD-10-CM

## 2019-01-31 DIAGNOSIS — G4733 Obstructive sleep apnea (adult) (pediatric): Secondary | ICD-10-CM

## 2019-02-01 LAB — CBC WITH DIFFERENTIAL/PLATELET
Basophils Absolute: 0 10*3/uL (ref 0.0–0.2)
Basos: 1 %
EOS (ABSOLUTE): 0.1 10*3/uL (ref 0.0–0.4)
Eos: 1 %
Hematocrit: 46.4 % (ref 37.5–51.0)
Hemoglobin: 15.5 g/dL (ref 13.0–17.7)
Immature Grans (Abs): 0 10*3/uL (ref 0.0–0.1)
Immature Granulocytes: 0 %
Lymphocytes Absolute: 2 10*3/uL (ref 0.7–3.1)
Lymphs: 23 %
MCH: 29.1 pg (ref 26.6–33.0)
MCHC: 33.4 g/dL (ref 31.5–35.7)
MCV: 87 fL (ref 79–97)
Monocytes Absolute: 0.9 10*3/uL (ref 0.1–0.9)
Monocytes: 10 %
Neutrophils Absolute: 5.4 10*3/uL (ref 1.4–7.0)
Neutrophils: 65 %
Platelets: 221 10*3/uL (ref 150–450)
RBC: 5.32 x10E6/uL (ref 4.14–5.80)
RDW: 12.5 % (ref 11.6–15.4)
WBC: 8.5 10*3/uL (ref 3.4–10.8)

## 2019-02-01 LAB — CMP14+EGFR
ALT: 14 IU/L (ref 0–44)
AST: 17 IU/L (ref 0–40)
Albumin/Globulin Ratio: 2.1 (ref 1.2–2.2)
Albumin: 4.7 g/dL (ref 3.8–4.9)
Alkaline Phosphatase: 88 IU/L (ref 39–117)
BUN/Creatinine Ratio: 14 (ref 9–20)
BUN: 12 mg/dL (ref 6–24)
Bilirubin Total: 0.9 mg/dL (ref 0.0–1.2)
CO2: 21 mmol/L (ref 20–29)
Calcium: 9.4 mg/dL (ref 8.7–10.2)
Chloride: 102 mmol/L (ref 96–106)
Creatinine, Ser: 0.87 mg/dL (ref 0.76–1.27)
GFR calc Af Amer: 109 mL/min/{1.73_m2} (ref >59)
GFR calc non Af Amer: 94 mL/min/{1.73_m2} (ref >59)
Globulin, Total: 2.2 g/dL (ref 1.5–4.5)
Glucose: 95 mg/dL (ref 65–99)
Potassium: 4.5 mmol/L (ref 3.5–5.2)
Sodium: 138 mmol/L (ref 134–144)
Total Protein: 6.9 g/dL (ref 6.0–8.5)

## 2019-02-01 LAB — PSA, TOTAL AND FREE
PSA, Free Pct: 30 %
PSA, Free: 0.18 ng/mL
Prostate Specific Ag, Serum: 0.6 ng/mL (ref 0.0–4.0)

## 2019-02-01 LAB — LIPID PANEL
Chol/HDL Ratio: 3.9 ratio (ref 0.0–5.0)
Cholesterol, Total: 164 mg/dL (ref 100–199)
HDL: 42 mg/dL (ref >39)
LDL Chol Calc (NIH): 106 mg/dL — ABNORMAL HIGH (ref 0–99)
Triglycerides: 84 mg/dL (ref 0–149)
VLDL Cholesterol Cal: 16 mg/dL (ref 5–40)

## 2019-02-01 LAB — TSH: TSH: 2.39 u[IU]/mL (ref 0.450–4.500)

## 2019-02-03 ENCOUNTER — Other Ambulatory Visit: Payer: Self-pay | Admitting: Family

## 2019-02-03 MED ORDER — ATORVASTATIN CALCIUM 20 MG PO TABS: 20.0000 mg | ORAL_TABLET | Freq: Every day | ORAL | 3 refills | Status: DC

## 2019-03-22 ENCOUNTER — Ambulatory Visit: Payer: BC Managed Care – PPO | Attending: Internal Medicine

## 2019-03-22 DIAGNOSIS — Z23 Encounter for immunization: Secondary | ICD-10-CM

## 2019-03-22 NOTE — Progress Notes (Signed)
   [id number]  Name:  [name].    MRN: [id number] DOB: [birth year]  03/22/2019  [name] was observed post Covid-19 immunization for 15 minutes without incident. He was provided with Vaccine Information Sheet and instruction to access the V-Safe system.   [name] was instructed to call 911 with any severe reactions post vaccine: Marland Kitchen Difficulty breathing  . Swelling of face and throat  . A fast heartbeat  . A bad rash all over body  . Dizziness and weakness   Immunizations Administered    Name Date Dose VIS Date Route   Pfizer COVID-19 Vaccine 03/22/2019  5:19 PM 0.3 mL 12/27/2018 Intramuscular   Manufacturer: [employer]   Lot: [id number]   [place]: [id number]

## 2019-04-12 ENCOUNTER — Ambulatory Visit: Payer: BC Managed Care – PPO | Attending: Internal Medicine

## 2019-04-12 DIAGNOSIS — Z23 Encounter for immunization: Secondary | ICD-10-CM

## 2019-04-12 NOTE — Progress Notes (Signed)
   [id number]  Name:  [name].    MRN: [id number] DOB: 06-25-1960  04/12/2019  [name] was observed post Covid-19 immunization for 15 minutes without incident. He was provided with Vaccine Information Sheet and instruction to access the V-Safe system.   [name] was instructed to call 911 with any severe reactions post vaccine: Marland Kitchen Difficulty breathing  . Swelling of face and throat  . A fast heartbeat  . A bad rash all over body  . Dizziness and weakness   Immunizations Administered    Name Date Dose VIS Date Route   Pfizer COVID-19 Vaccine 04/12/2019  2:06 PM 0.3 mL 12/27/2018 Intramuscular   Manufacturer: [employer]   Lot: [id number]   [place]: [id number]

## 2019-04-16 ENCOUNTER — Encounter: Payer: Self-pay | Admitting: *Deleted

## 2019-04-18 ENCOUNTER — Other Ambulatory Visit: Payer: Self-pay | Admitting: Family

## 2019-04-18 DIAGNOSIS — I10 Essential (primary) hypertension: Secondary | ICD-10-CM

## 2019-04-22 ENCOUNTER — Encounter: Payer: Self-pay | Admitting: Dermatology

## 2019-04-22 ENCOUNTER — Other Ambulatory Visit: Payer: Self-pay

## 2019-04-22 ENCOUNTER — Ambulatory Visit: Payer: BC Managed Care – PPO | Admitting: Dermatology

## 2019-04-22 DIAGNOSIS — L570 Actinic keratosis: Secondary | ICD-10-CM | POA: Diagnosis not present

## 2019-04-22 DIAGNOSIS — Z85828 Personal history of other malignant neoplasm of skin: Secondary | ICD-10-CM | POA: Diagnosis not present

## 2019-04-22 DIAGNOSIS — D229 Melanocytic nevi, unspecified: Secondary | ICD-10-CM

## 2019-04-23 ENCOUNTER — Ambulatory Visit: Payer: BC Managed Care – PPO

## 2019-04-23 ENCOUNTER — Encounter: Payer: Self-pay | Admitting: Dermatology

## 2019-04-23 NOTE — Progress Notes (Signed)
   Follow-Up Visit   Subjective  [name]. is a 59 y.o. male who presents for the following: Skin Problem (Wife is concerned about place under left eye.  He does not know how long it has been there but it is asymptomatic.  Also, has a crusty place that comes and goes under right outer eye.  No bleeding or soreness.).  Growth  Location: Face Duration: Several years Quality: Stable Associated Signs/Symptoms: Modifying Factors:  Severity:  Timing: Context: Wife is concerned; history of skin cancers.  The following portions of the chart were reviewed this encounter and updated as appropriate:     Objective  Well appearing patient in no apparent distress; mood and affect are within normal limits.  All skin waist up examined. No sign skin cancers. Small AKs: defer No recurrent skin cancer. Assessment & Plan  consider PDT in late fall winter.

## 2019-05-19 ENCOUNTER — Other Ambulatory Visit: Payer: Self-pay

## 2019-05-19 ENCOUNTER — Ambulatory Visit: Payer: BC Managed Care – PPO | Admitting: Internal Medicine

## 2019-05-19 ENCOUNTER — Encounter: Payer: Self-pay | Admitting: Internal Medicine

## 2019-05-19 DIAGNOSIS — Z87891 Personal history of nicotine dependence: Secondary | ICD-10-CM

## 2019-05-19 DIAGNOSIS — I480 Paroxysmal atrial fibrillation: Secondary | ICD-10-CM

## 2019-05-19 DIAGNOSIS — G4733 Obstructive sleep apnea (adult) (pediatric): Secondary | ICD-10-CM | POA: Diagnosis not present

## 2019-05-19 NOTE — Patient Instructions (Signed)
Order- DME [facility]- please return SD card to patient for his CPAP.  We can continue autopap 5-0, mask of choice, humidifier, supplies, AirView/ card  Please call if we can help

## 2019-05-19 NOTE — Progress Notes (Signed)
HPI M smoker followed for OSA, complicated by CAD/ MI, HBP, PAFib, Hyperlipidemia, Obesity, Osteoarthritis NPSG 08/06/2018-  AHI 46.8/ hr, desaturation to 85%, CPAP titrated to 9,   ----------------------------------------------------------------------------------  11/18/2018- 59 yo M former smoker followed for OSA, complicated by CAD/ MI, HBP, PAFib, Hyperlipidemia, Obesity, Osteoarthritis 3 month f/u OSA NPSG 08/06/2018-  AHI 46.8/ hr, desaturation to 85%, CPAP titrated to 9,  Body weight 224 lbs Body weight today 222 lbs CPAP auto 5-20/ [facility] Download compliance 100%, AHI 1.3/ hr He reports easy adjustment to CPAP. Sleeping well. Wife tell him he is quiet.  Continues to smoke. Denies cough, wheeze, SOB.  05/19/19- 60 yo M former Smoker followed for OSA, complicated by CAD/ MI, HBP, PAFib, Hyperlipidemia, Obesity, Osteoarthritis CPAP auto 5-20/ [place] not available- no SD card. He denies concerns, reporting he is using CPAP every night and sleeps better with it.  Denies interval medical problems of concern.  ROS-see HPI   + = positive Constitutional:    weight loss, night sweats, fevers, chills, fatigue, lassitude. HEENT:    headaches, difficulty swallowing, tooth/dental problems, sore throat,       sneezing, itching, ear ache, nasal congestion, post nasal drip, snoring CV:    chest pain, orthopnea, PND, swelling in lower extremities, anasarca,                                   dizziness, palpitations Resp:   shortness of breath with exertion or at rest.                productive cough,   non-productive cough, coughing up of blood.              change in color of mucus.  wheezing.   Skin:    rash or lesions. GI:  No-   heartburn, indigestion, abdominal pain, nausea, vomiting, diarrhea,                 change in bowel habits, loss of appetite GU: dysuria, change in color of urine, no urgency or frequency.   flank pain. MS:   joint pain, stiffness, decreased  range of motion, back pain. Neuro-     nothing unusual Psych:  change in mood or affect.  depression or anxiety.   memory loss.  OBJ- Physical Exam General- Alert, Oriented, Affect-appropriate, Distress- none acute Skin- rash-none, lesions- none, excoriation- none Lymphadenopathy- none Head- atraumatic            Eyes- Gross vision intact, PERRLA, conjunctivae and secretions clear            Ears- Hearing, canals-normal            Nose- Clear, no-Septal dev, mucus, polyps, erosion, perforation             Throat- Mallampati II- III, mucosa clear , drainage- none, tonsils- atrophic Neck- flexible , trachea midline, no stridor , thyroid nl, carotid no bruit Chest - symmetrical excursion , unlabored           Heart/CV- RRR , no murmur , no gallop  , no rub, nl s1 s2                           - JVD- none , edema- none, stasis changes- none, varices- none           Lung- clear to P&A,  wheeze- none, cough- none , dullness-none, rub- none           Chest wall-  Abd-  Br/ Gen/ Rectal- Not done, not indicated Extrem- cyanosis- none, clubbing, none, atrophy- none, strength- nl Neuro- grossly intact to observation

## 2019-06-24 NOTE — Assessment & Plan Note (Signed)
Remains abstinent 

## 2019-06-24 NOTE — Assessment & Plan Note (Signed)
Continues to work with cardiology. Denies new events.

## 2019-06-24 NOTE — Assessment & Plan Note (Signed)
He recognizes benefit and is comfortable continuing CPAP. Plan- continue CPAP auto 5-20. Contact [facility] to provide SD card so we can  Assess pressure parameters

## 2019-07-21 ENCOUNTER — Other Ambulatory Visit: Payer: Self-pay | Admitting: Family

## 2019-07-21 DIAGNOSIS — I10 Essential (primary) hypertension: Secondary | ICD-10-CM

## 2019-07-22 NOTE — Telephone Encounter (Signed)
Ov 01/29/19 RTC 1 yr

## 2019-08-20 ENCOUNTER — Other Ambulatory Visit: Payer: Self-pay

## 2019-08-20 NOTE — Telephone Encounter (Signed)
Last office visit 01/29/2019 Last refill 01/07/2018, #30, 3 refills

## 2019-08-21 MED ORDER — SILDENAFIL CITRATE 20 MG PO TABS: ORAL_TABLET | ORAL | 3 refills | Status: DC

## 2019-10-09 ENCOUNTER — Other Ambulatory Visit: Payer: Self-pay | Admitting: Physician Assistant

## 2019-10-09 DIAGNOSIS — I480 Paroxysmal atrial fibrillation: Secondary | ICD-10-CM

## 2019-10-09 DIAGNOSIS — Z87891 Personal history of nicotine dependence: Secondary | ICD-10-CM

## 2019-10-09 DIAGNOSIS — I10 Essential (primary) hypertension: Secondary | ICD-10-CM

## 2019-10-09 DIAGNOSIS — U071 COVID-19: Secondary | ICD-10-CM

## 2019-10-09 DIAGNOSIS — I214 Non-ST elevation (NSTEMI) myocardial infarction: Secondary | ICD-10-CM

## 2019-10-09 NOTE — Progress Notes (Signed)
I connected by phone with [name] on 10/09/2019 at 11:09 PM to discuss the potential use of a new treatment for mild to moderate COVID-19 viral infection in non-hospitalized patients.  This patient is a 59 y.o. male that meets the FDA criteria for Emergency Use Authorization of COVID monoclonal antibody casirivimab/imdevimab.  Has a (+) direct SARS-CoV-2 viral test result  Has mild or moderate COVID-19   Is NOT hospitalized due to COVID-19  Is within 10 days of symptom onset  Has at least one of the high risk factor(s) for progression to severe COVID-19 and/or hospitalization as defined in EUA.  Specific high risk criteria : BMI > 25 and Cardiovascular disease or hypertension   I have spoken and communicated the following to the patient or parent/caregiver regarding COVID monoclonal antibody treatment:  1. FDA has authorized the emergency use for the treatment of mild to moderate COVID-19 in adults and pediatric patients with positive results of direct SARS-CoV-2 viral testing who are 1 years of age and older weighing at least 40 kg, and who are at high risk for progressing to severe COVID-19 and/or hospitalization.  2. The significant known and potential risks and benefits of COVID monoclonal antibody, and the extent to which such potential risks and benefits are unknown.  3. Information on available alternative treatments and the risks and benefits of those alternatives, including clinical trials.  4. Patients treated with COVID monoclonal antibody should continue to self-isolate and use infection control measures (e.g., wear mask, isolate, social distance, avoid sharing personal items, clean and disinfect "high touch" surfaces, and frequent handwashing) according to CDC guidelines.   5. The patient or parent/caregiver has the option to accept or refuse COVID monoclonal antibody treatment.  After reviewing this information with the patient, The patient agreed to proceed with  receiving casirivimab\imdevimab infusion and will be provided a copy of the Fact sheet prior to receiving the infusion.  Sx onset 9/20. Set up for infusion on 9/24 @ 12:30pm. Directions given to [facility]. Pt is aware that insurance will be charged an infusion fee. The pt is fully vaccinated.   Angelena Form 10/09/2019 11:09 PM

## 2019-10-10 ENCOUNTER — Ambulatory Visit (HOSPITAL_COMMUNITY)
Admission: RE | Admit: 2019-10-10 | Discharge: 2019-10-10 | Disposition: A | Discharge status: Home / Self Care | Payer: 59 | Source: Ambulatory Visit | Attending: Pulmonary Disease | Admitting: Pulmonary Disease

## 2019-10-10 ENCOUNTER — Other Ambulatory Visit (HOSPITAL_COMMUNITY): Payer: Self-pay

## 2019-10-10 DIAGNOSIS — I214 Non-ST elevation (NSTEMI) myocardial infarction: Secondary | ICD-10-CM | POA: Insufficient documentation

## 2019-10-10 DIAGNOSIS — I480 Paroxysmal atrial fibrillation: Secondary | ICD-10-CM

## 2019-10-10 DIAGNOSIS — Z87891 Personal history of nicotine dependence: Secondary | ICD-10-CM | POA: Diagnosis present

## 2019-10-10 DIAGNOSIS — U071 COVID-19: Secondary | ICD-10-CM | POA: Insufficient documentation

## 2019-10-10 DIAGNOSIS — I10 Essential (primary) hypertension: Secondary | ICD-10-CM

## 2019-10-10 MED ORDER — METHYLPREDNISOLONE SODIUM SUCC 125 MG IJ SOLR: 125.0000 mg | Freq: Once | INTRAMUSCULAR | Status: DC | PRN

## 2019-10-10 MED ORDER — ALBUTEROL SULFATE HFA 108 (90 BASE) MCG/ACT IN AERS: 2.0000 | INHALATION_SPRAY | Freq: Once | RESPIRATORY_TRACT | Status: DC | PRN

## 2019-10-10 MED ORDER — SODIUM CHLORIDE 0.9 % IV SOLN: INTRAVENOUS | Status: DC | PRN

## 2019-10-10 MED ORDER — FAMOTIDINE IN NACL 20-0.9 MG/50ML-% IV SOLN: 20.0000 mg | Freq: Once | INTRAVENOUS | Status: DC | PRN

## 2019-10-10 MED ORDER — SODIUM CHLORIDE 0.9 % IV SOLN
1200.0000 mg | Freq: Once | INTRAVENOUS | Status: AC
  Administered 2019-10-10: 1200 mg via INTRAVENOUS

## 2019-10-10 MED ORDER — EPINEPHRINE 0.3 MG/0.3ML IJ SOAJ: 0.3000 mg | Freq: Once | INTRAMUSCULAR | Status: DC | PRN

## 2019-10-10 MED ORDER — DIPHENHYDRAMINE HCL 50 MG/ML IJ SOLN: 50.0000 mg | Freq: Once | INTRAMUSCULAR | Status: DC | PRN

## 2019-10-10 NOTE — Progress Notes (Signed)
  Diagnosis: COVID-19  Physician: Dr. Joya Gaskins  Procedure: [facility]fusion [facility] Med: casirivimab\imdevimab infusion - Provided patient with casirivimab\imdevimab fact sheet for patients, parents and caregivers prior to infusion.  Complications: No immediate complications noted.  Discharge: Discharged home   [place] 10/10/2019

## 2019-10-10 NOTE — Discharge Instructions (Signed)

## 2019-10-21 ENCOUNTER — Telehealth: Payer: Self-pay | Admitting: Cardiovascular Disease

## 2019-10-21 NOTE — Telephone Encounter (Signed)
lvm for patient to return call to get follow up scheduled with [facility] from recall list

## 2019-11-25 ENCOUNTER — Other Ambulatory Visit: Payer: Self-pay

## 2019-11-25 ENCOUNTER — Ambulatory Visit (INDEPENDENT_AMBULATORY_CARE_PROVIDER_SITE_OTHER): Payer: 59 | Admitting: *Deleted

## 2019-11-25 DIAGNOSIS — Z23 Encounter for immunization: Secondary | ICD-10-CM

## 2020-01-09 IMAGING — US SOFT TISSUE ULTRASOUND HEAD/NECK
1 series · 8 of 8 positions shown · non-contrast
Comparison: None.

CLINICAL DATA: 57-year-old male with node right neck

EXAM:
ULTRASOUND OF HEAD/NECK SOFT TISSUES
TECHNIQUE: Ultrasound examination of the head and neck soft tissues was
performed in the area of clinical concern.

[Series 1: soft tissue ultrasound head/neck · 8 of 8 slices shown]
[im 1/8]
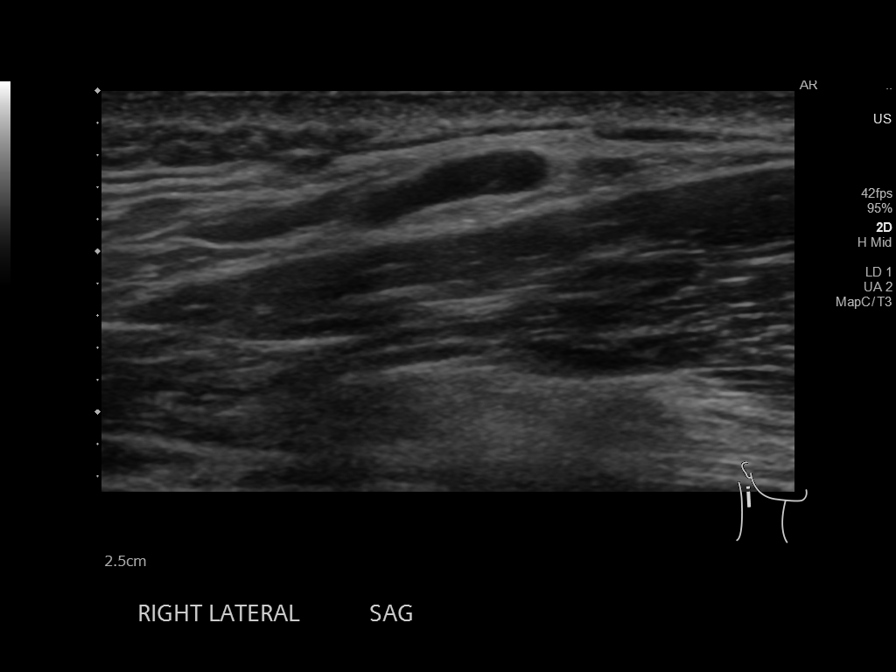
[im 2/8]
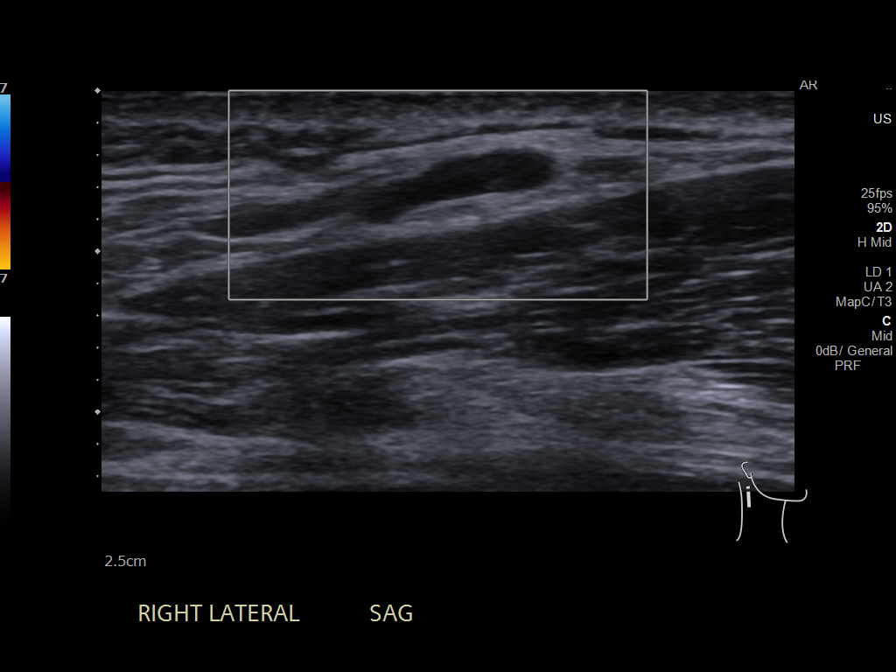
[im 3/8]
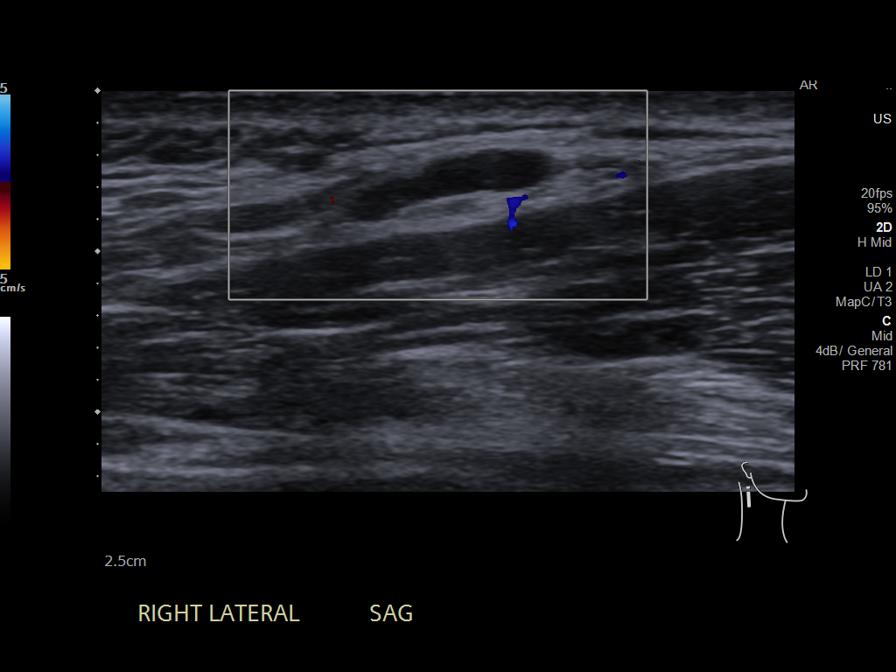
[im 4/8]
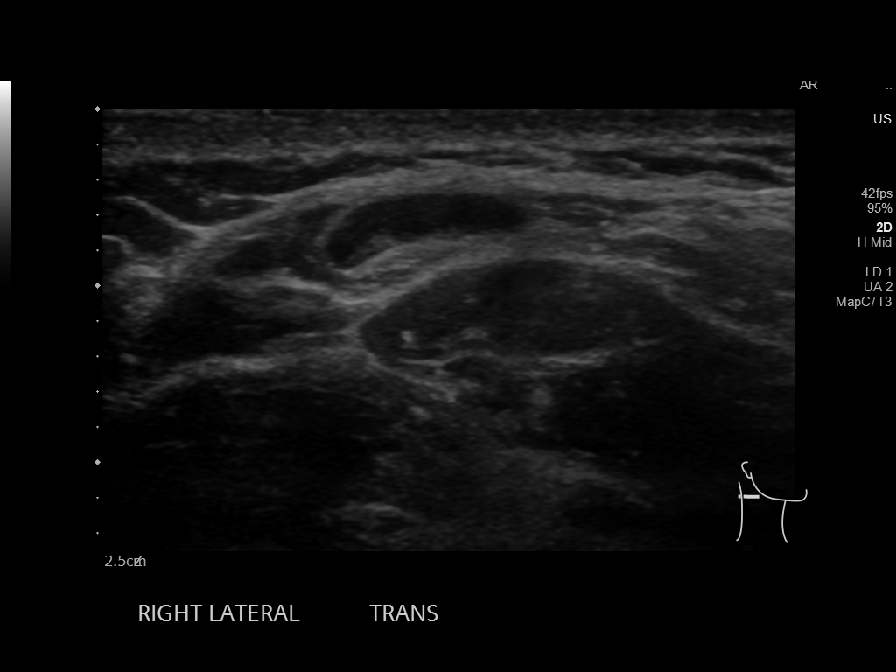
[im 5/8]
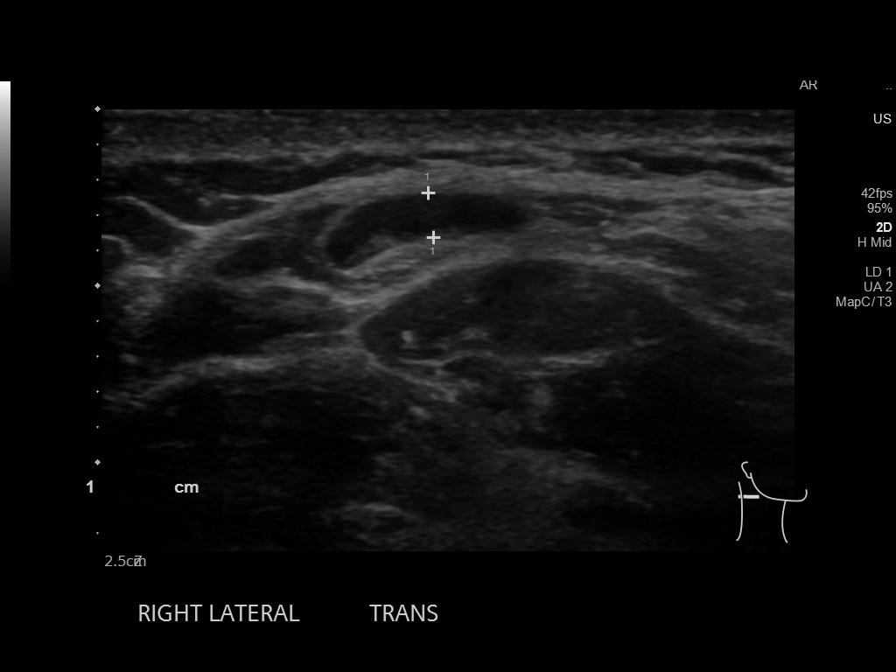
[im 6/8]
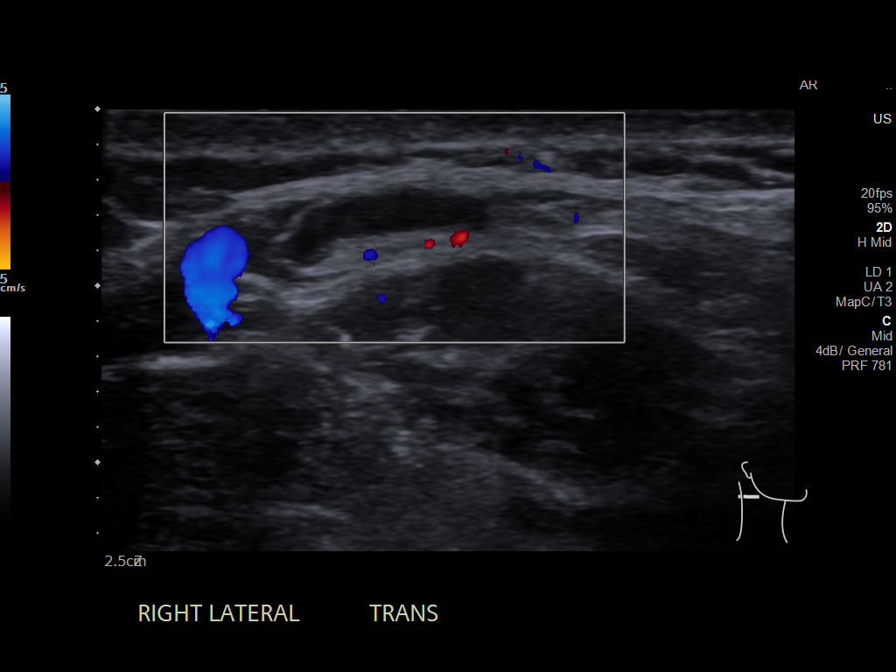
[im 7/8]
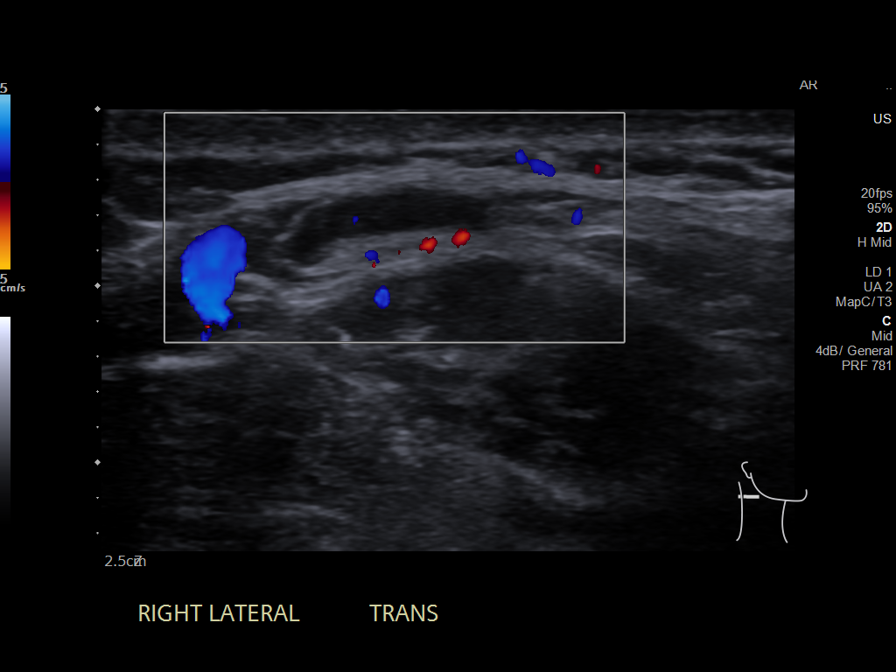
[im 8/8]
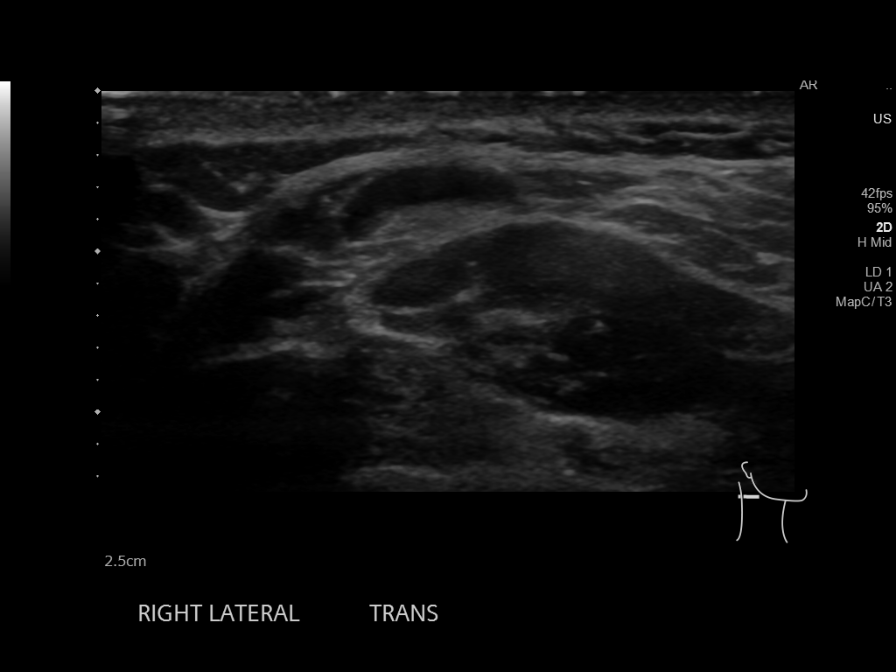

[8 of 8 positions shown; findings below may reference images not displayed]

FINDINGS: Grayscale and color ultrasound performed of the region of clinical
concern.

Typical appearing lymph nodes. No soft tissue lesion or focal fluid.
IMPRESSION: Ultrasound demonstrates typical appearing lymph nodes in the region
of clinical concern.

## 2020-01-19 ENCOUNTER — Other Ambulatory Visit: Payer: Self-pay | Admitting: Family

## 2020-01-19 DIAGNOSIS — I10 Essential (primary) hypertension: Secondary | ICD-10-CM

## 2020-02-19 ENCOUNTER — Other Ambulatory Visit: Payer: Self-pay | Admitting: Family

## 2020-02-19 DIAGNOSIS — I10 Essential (primary) hypertension: Secondary | ICD-10-CM

## 2020-03-22 ENCOUNTER — Other Ambulatory Visit: Payer: Self-pay | Admitting: Family

## 2020-03-22 DIAGNOSIS — I10 Essential (primary) hypertension: Secondary | ICD-10-CM

## 2020-03-26 ENCOUNTER — Other Ambulatory Visit: Payer: Self-pay | Admitting: Family

## 2020-03-26 DIAGNOSIS — I10 Essential (primary) hypertension: Secondary | ICD-10-CM

## 2020-03-27 ENCOUNTER — Other Ambulatory Visit: Payer: Self-pay | Admitting: Family

## 2020-03-27 DIAGNOSIS — I10 Essential (primary) hypertension: Secondary | ICD-10-CM

## 2020-03-30 ENCOUNTER — Telehealth: Payer: Self-pay

## 2020-03-30 DIAGNOSIS — I10 Essential (primary) hypertension: Secondary | ICD-10-CM

## 2020-03-30 NOTE — Telephone Encounter (Signed)
  Prescription Request  03/30/2020  What is the name of the medication or equipment? amLODipine-benazepril (LOTREL) 5-10 MG capsule    Have you contacted your pharmacy to request a refill? (if applicable) yes  Which pharmacy would you like this sent to? [facility] [place], pt has been out for a week and he needs this refilled    Patient notified that their request is being sent to the clinical staff for review and that they should receive a response within 2 business days.

## 2020-03-31 MED ORDER — AMLODIPINE BESY-BENAZEPRIL HCL 5-10 MG PO CAPS: 1.0000 | ORAL_CAPSULE | Freq: Every day | ORAL | 0 refills | Status: DC

## 2020-03-31 NOTE — Telephone Encounter (Signed)
Per msg from nurse appt has been scheduled with PCP for April, still needs refill on BP medication he has been out for a week. Please call patient once medication has been sent to pharmacy

## 2020-03-31 NOTE — Telephone Encounter (Signed)
Per chart - medication has not been filled due to patient needing an appointment to be seen. NO appointment scheduled.  Please schedule appointment.

## 2020-03-31 NOTE — Telephone Encounter (Signed)
Medication sent - patient aware 

## 2020-04-20 ENCOUNTER — Encounter: Payer: Self-pay | Admitting: Family

## 2020-04-20 ENCOUNTER — Other Ambulatory Visit: Payer: Self-pay

## 2020-04-20 ENCOUNTER — Ambulatory Visit (INDEPENDENT_AMBULATORY_CARE_PROVIDER_SITE_OTHER): Payer: 59 | Admitting: Family

## 2020-04-20 VITALS — BP 143/82 | HR 72 | Temp 98.5°F | Ht 72.0 in | Wt 241.2 lb

## 2020-04-20 DIAGNOSIS — Z87891 Personal history of nicotine dependence: Secondary | ICD-10-CM | POA: Diagnosis not present

## 2020-04-20 DIAGNOSIS — Z1211 Encounter for screening for malignant neoplasm of colon: Secondary | ICD-10-CM

## 2020-04-20 DIAGNOSIS — E669 Obesity, unspecified: Secondary | ICD-10-CM | POA: Diagnosis not present

## 2020-04-20 DIAGNOSIS — E66811 Obesity, class 1: Secondary | ICD-10-CM

## 2020-04-20 DIAGNOSIS — I10 Essential (primary) hypertension: Secondary | ICD-10-CM

## 2020-04-20 DIAGNOSIS — G4733 Obstructive sleep apnea (adult) (pediatric): Secondary | ICD-10-CM

## 2020-04-20 DIAGNOSIS — Z6832 Body mass index (BMI) 32.0-32.9, adult: Secondary | ICD-10-CM

## 2020-04-20 DIAGNOSIS — M150 Primary generalized (osteo)arthritis: Secondary | ICD-10-CM

## 2020-04-20 DIAGNOSIS — M8949 Other hypertrophic osteoarthropathy, multiple sites: Secondary | ICD-10-CM

## 2020-04-20 DIAGNOSIS — M159 Polyosteoarthritis, unspecified: Secondary | ICD-10-CM

## 2020-04-20 DIAGNOSIS — E785 Hyperlipidemia, unspecified: Secondary | ICD-10-CM | POA: Diagnosis not present

## 2020-04-20 MED ORDER — AMLODIPINE BESY-BENAZEPRIL HCL 5-10 MG PO CAPS: 1.0000 | ORAL_CAPSULE | Freq: Every day | ORAL | 1 refills | Status: DC

## 2020-04-20 MED ORDER — METOPROLOL SUCCINATE ER 25 MG PO TB24: 1.0000 | ORAL_TABLET | Freq: Every day | ORAL | 1 refills | Status: DC

## 2020-04-20 NOTE — Patient Instructions (Signed)

## 2020-04-20 NOTE — Progress Notes (Signed)
Subjective:    Patient ID: [name]., male    DOB: 1960/09/04, 60 y.o.   MRN: [id number]  Chief Complaint  Patient presents with  . Medical Management of Chronic Issues  . Hypertension   PT presents to the office today for chronic follow up. Pt is followed by Cardiologists annually for hx of MI.  Hypertension This is a chronic problem. The current episode started more than 1 year ago. The problem has been waxing and waning since onset. The problem is uncontrolled. Pertinent negatives include no malaise/fatigue, peripheral edema or shortness of breath. Risk factors for coronary artery disease include male gender, sedentary lifestyle and dyslipidemia. The current treatment provides moderate improvement.  Hyperlipidemia This is a chronic problem. The current episode started more than 1 year ago. The problem is uncontrolled. Pertinent negatives include no shortness of breath. Current antihyperlipidemic treatment includes diet change. The current treatment provides mild improvement of lipids. Risk factors for coronary artery disease include dyslipidemia, male sex, hypertension and a sedentary lifestyle.  Arthritis Presents for follow-up visit. He complains of pain and stiffness. The symptoms have been stable. Affected locations include the left MCP and right MCP. His pain is at a severity of 1/10.  OSA  Uses CPAP nightly. Stable.      Review of Systems  Constitutional: Negative for malaise/fatigue.  Respiratory: Negative for shortness of breath.   Musculoskeletal: Positive for arthritis and stiffness.  All other systems reviewed and are negative.      Objective:   Physical Exam Vitals reviewed.  Constitutional:      General: He is not in acute distress.    Appearance: He is well-developed.  HENT:     Head: Normocephalic.     Right Ear: Tympanic membrane normal.     Left Ear: Tympanic membrane normal.  Eyes:     General:        Right eye: No discharge.        Left eye:  No discharge.     Pupils: Pupils are equal, round, and reactive to light.  Neck:     Thyroid: No thyromegaly.  Cardiovascular:     Rate and Rhythm: Normal rate and regular rhythm.     Heart sounds: Normal heart sounds. No murmur heard.   Pulmonary:     Effort: Pulmonary effort is normal. No respiratory distress.     Breath sounds: Normal breath sounds. No wheezing.  Abdominal:     General: Bowel sounds are normal. There is no distension.     Palpations: Abdomen is soft.     Tenderness: There is no abdominal tenderness.  Musculoskeletal:        General: No tenderness. Normal range of motion.     Cervical back: Normal range of motion and neck supple.  Skin:    General: Skin is warm and dry.     Findings: No erythema or rash.  Neurological:     Mental Status: He is alert and oriented to person, place, and time.     Cranial Nerves: No cranial nerve deficit.     Deep Tendon Reflexes: Reflexes are normal and symmetric.  Psychiatric:        Behavior: Behavior normal.        Thought Content: Thought content normal.        Judgment: Judgment normal.       BP (!) 143/82   Pulse 72   Temp 98.5 F (36.9 C) (Temporal)   Ht 6' (1.829 m)  Wt 241 lb 3.2 oz (109.4 kg)   BMI 32.71 kg/m      Assessment & Plan:  [name]. comes in today with chief complaint of Medical Management of Chronic Issues and Hypertension   Diagnosis and orders addressed:  1. Essential hypertension -Pt will monitor at home. If BP >140/90 will let us know.  - metoprolol succinate (TOPROL-XL) 25 MG 24 hr tablet; Take 1 tablet (25 mg total) by mouth daily.  Dispense: 90 tablet; Refill: 1 - amLODipine-benazepril (LOTREL) 5-10 MG capsule; Take 1 capsule by mouth daily.  Dispense: 90 capsule; Refill: 1 - CMP14+EGFR - CBC with Differential/Platelet  2. Hyperlipidemia, unspecified hyperlipidemia type - CMP14+EGFR - CBC with Differential/Platelet - Lipid panel  3. Hx of smoking - CMP14+EGFR - CBC  with Differential/Platelet  4. Class 1 obesity with serious comorbidity and body mass index (BMI) of 32.0 to 32.9 in adult, unspecified obesity type - CMP14+EGFR - CBC with Differential/Platelet  5. Primary osteoarthritis involving multiple joints - CMP14+EGFR - CBC with Differential/Platelet  6. OSA (obstructive sleep apnea) - CMP14+EGFR - CBC with Differential/Platelet  7. Colon cancer screening  - Ambulatory referral to Gastroenterology - CMP14+EGFR - CBC with Differential/Platelet   Labs pending Health Maintenance reviewed Diet and exercise encouraged  Follow up plan: 6 months    [name], FNP

## 2020-04-21 ENCOUNTER — Encounter: Payer: Self-pay | Admitting: Dermatology

## 2020-04-21 ENCOUNTER — Other Ambulatory Visit: Payer: Self-pay | Admitting: Family

## 2020-04-21 ENCOUNTER — Ambulatory Visit (INDEPENDENT_AMBULATORY_CARE_PROVIDER_SITE_OTHER): Payer: 59 | Admitting: Dermatology

## 2020-04-21 DIAGNOSIS — Z1283 Encounter for screening for malignant neoplasm of skin: Secondary | ICD-10-CM

## 2020-04-21 DIAGNOSIS — L918 Other hypertrophic disorders of the skin: Secondary | ICD-10-CM

## 2020-04-21 DIAGNOSIS — L821 Other seborrheic keratosis: Secondary | ICD-10-CM

## 2020-04-21 DIAGNOSIS — L570 Actinic keratosis: Secondary | ICD-10-CM

## 2020-04-21 LAB — CMP14+EGFR
ALT: 22 IU/L (ref 0–44)
AST: 20 IU/L (ref 0–40)
Albumin/Globulin Ratio: 2.1 (ref 1.2–2.2)
Albumin: 4.7 g/dL (ref 3.8–4.9)
Alkaline Phosphatase: 82 IU/L (ref 44–121)
BUN/Creatinine Ratio: 13 (ref 10–24)
BUN: 13 mg/dL (ref 8–27)
Bilirubin Total: 0.4 mg/dL (ref 0.0–1.2)
CO2: 25 mmol/L (ref 20–29)
Calcium: 9.8 mg/dL (ref 8.6–10.2)
Chloride: 103 mmol/L (ref 96–106)
Creatinine, Ser: 1 mg/dL (ref 0.76–1.27)
Globulin, Total: 2.2 g/dL (ref 1.5–4.5)
Glucose: 75 mg/dL (ref 65–99)
Potassium: 4.7 mmol/L (ref 3.5–5.2)
Sodium: 141 mmol/L (ref 134–144)
Total Protein: 6.9 g/dL (ref 6.0–8.5)
eGFR: 86 mL/min/{1.73_m2} (ref >59)

## 2020-04-21 LAB — CBC WITH DIFFERENTIAL/PLATELET
Basophils Absolute: 0.1 10*3/uL (ref 0.0–0.2)
Basos: 1 %
EOS (ABSOLUTE): 0.1 10*3/uL (ref 0.0–0.4)
Eos: 2 %
Hematocrit: 47.5 % (ref 37.5–51.0)
Hemoglobin: 15.5 g/dL (ref 13.0–17.7)
Immature Grans (Abs): 0 10*3/uL (ref 0.0–0.1)
Immature Granulocytes: 0 %
Lymphocytes Absolute: 2.4 10*3/uL (ref 0.7–3.1)
Lymphs: 29 %
MCH: 29.7 pg (ref 26.6–33.0)
MCHC: 32.6 g/dL (ref 31.5–35.7)
MCV: 91 fL (ref 79–97)
Monocytes Absolute: 1.2 10*3/uL — ABNORMAL HIGH (ref 0.1–0.9)
Monocytes: 15 %
Neutrophils Absolute: 4.4 10*3/uL (ref 1.4–7.0)
Neutrophils: 53 %
Platelets: 230 10*3/uL (ref 150–450)
RBC: 5.22 x10E6/uL (ref 4.14–5.80)
RDW: 12.4 % (ref 11.6–15.4)
WBC: 8.1 10*3/uL (ref 3.4–10.8)

## 2020-04-21 LAB — LIPID PANEL
Chol/HDL Ratio: 4.2 ratio (ref 0.0–5.0)
Cholesterol, Total: 160 mg/dL (ref 100–199)
HDL: 38 mg/dL — ABNORMAL LOW (ref >39)
LDL Chol Calc (NIH): 97 mg/dL (ref 0–99)
Triglycerides: 139 mg/dL (ref 0–149)
VLDL Cholesterol Cal: 25 mg/dL (ref 5–40)

## 2020-04-21 MED ORDER — ROSUVASTATIN CALCIUM 10 MG PO TABS: 10.0000 mg | ORAL_TABLET | Freq: Every day | ORAL | 3 refills | Status: DC

## 2020-04-21 NOTE — Patient Instructions (Signed)
Patient is aware he is to call the office in the fall for his Tolak prescription and instructions.

## 2020-05-03 ENCOUNTER — Encounter: Payer: Self-pay | Admitting: Dermatology

## 2020-05-03 NOTE — Progress Notes (Signed)
   Follow-Up Visit   Subjective  [name]. is a 60 y.o. male who presents for the following: Annual Exam (Here for full body skin examination- no new concerns).  General skin check, multiple small crusts on sun exposed areas Location:  Duration:  Quality:  Associated Signs/Symptoms: Modifying Factors:  Severity:  Timing: Context:   Objective  Well appearing patient in no apparent distress; mood and affect are within normal limits. Objective  Scalp: Full body skin check. No atypical moles, no skin cancer.  Objective  Left Axilla, Right Axilla: 2 mm pedunculated papules  Objective  Left Popliteal Fossa, Right Popliteal Fossa: Brown 4 to 6 mm textured flattopped papules  Objective  Left Forearm - Posterior (4), Left Mid Helix, Left Shoulder - Posterior, Right Hand - Posterior, Right Mid Helix, Right Temporal Scalp: Multiple small 2 to 4 mm flat gritty crusts.  Follow up patient to call after 28 applications of Tolak in the Fall.     A full examination was performed including scalp, head, eyes, ears, nose, lips, neck, chest, axillae, abdomen, back, buttocks, bilateral upper extremities, bilateral lower extremities, hands, feet, fingers, toes, fingernails, and toenails. All findings within normal limits unless otherwise noted below.   Assessment & Plan    Screening exam for skin cancer Scalp  Yearly skin check, encouraged to self examine twice annually.  Continued ultraviolet protection.  Skin tag (2) Left Axilla; Right Axilla  No intervention necessary  Seborrheic keratosis (2) Left Popliteal Fossa; Right Popliteal Fossa  Leave if stable.  AK (actinic keratosis) (9) Left Shoulder - Posterior; Right Hand - Posterior; Left Forearm - Posterior (4); Left Mid Helix; Right Mid Helix; Right Temporal Scalp  Will use a topical fluorouracil (Tolak) in the late fall.  Destruction of lesion - Left Forearm - Posterior, Left Mid Helix, Left Shoulder - Posterior,  Right Hand - Posterior, Right Mid Helix, Right Temporal Scalp Complexity: simple   Destruction method: cryotherapy   Informed consent: discussed and consent obtained   Timeout:  patient name, date of birth, surgical site, and procedure verified Lesion destroyed using liquid nitrogen: Yes   Cryotherapy cycles:  5 Outcome: patient tolerated procedure well with no complications   Post-procedure details: wound care instructions given        I, Lavonna Monarch, MD, have reviewed all documentation for this visit.  The documentation on 05/03/20 for the exam, diagnosis, procedures, and orders are all accurate and complete.

## 2020-05-14 NOTE — Progress Notes (Signed)
HPI M smoker followed for OSA, complicated by CAD/ MI, HBP, PAFib, Hyperlipidemia, Obesity, Osteoarthritis NPSG 08/06/2018-  AHI 46.8/ hr, desaturation to 85%, CPAP titrated to 9,   ----------------------------------------------------------------------------------   05/19/19- 60 yo M former Smoker followed for OSA, complicated by CAD/ MI, HBP, PAFib, Hyperlipidemia, Obesity, Osteoarthritis CPAP auto 5-20/ [place] not available- no SD card. He denies concerns, reporting he is using CPAP every night and sleeps better with it.  Denies interval medical problems of concern.  05/17/20- 59 yo M former Smoker followed for OSA, complicated by CAD/ MI, HBP, PAFib, Hyperlipidemia, Obesity, Osteoarthritis CPAP auto 5-20/ [place] Download-compliance 89%, AHI 1.5/ hr       Pressure range 6-8.5  Body weight today-240 lbs Covid vax-2 Phizer -----No current respiratory concerns.  I think he has a Respironics machine. Working very well for him. Download reviewed. He says he is used to it, and wife likes that his snoring is stopped.  No new problems or concerns.    ROS-see HPI   + = positive Constitutional:    weight loss, night sweats, fevers, chills, fatigue, lassitude. HEENT:    headaches, difficulty swallowing, tooth/dental problems, sore throat,       sneezing, itching, ear ache, nasal congestion, post nasal drip, snoring CV:    chest pain, orthopnea, PND, swelling in lower extremities, anasarca,                                   dizziness, palpitations Resp:   shortness of breath with exertion or at rest.                productive cough,   non-productive cough, coughing up of blood.              change in color of mucus.  wheezing.   Skin:    rash or lesions. GI:  No-   heartburn, indigestion, abdominal pain, nausea, vomiting, diarrhea,                 change in bowel habits, loss of appetite GU: dysuria, change in color of urine, no urgency or frequency.   flank pain. MS:    joint pain, stiffness, decreased range of motion, back pain. Neuro-     nothing unusual Psych:  change in mood or affect.  depression or anxiety.   memory loss.  OBJ- Physical Exam General- Alert, Oriented, Affect-appropriate, Distress- none acute Skin- rash-none, lesions- none, excoriation- none Lymphadenopathy- none Head- atraumatic            Eyes- Gross vision intact, PERRLA, conjunctivae and secretions clear            Ears- Hearing, canals-normal            Nose- Clear, no-Septal dev, mucus, polyps, erosion, perforation             Throat- Mallampati II- III, mucosa clear , drainage- none, tonsils- atrophic Neck- flexible , trachea midline, no stridor , thyroid nl, carotid no bruit Chest - symmetrical excursion , unlabored           Heart/CV- RRR , no murmur , no gallop  , no rub, nl s1 s2                           - JVD- none , edema- none, stasis changes- none, varices- none  Lung- clear to P&A, wheeze- none, cough- none , dullness-none, rub- none           Chest wall-  Abd-  Br/ Gen/ Rectal- Not done, not indicated Extrem- cyanosis- none, clubbing, none, atrophy- none, strength- nl Neuro- grossly intact to observation

## 2020-05-17 ENCOUNTER — Other Ambulatory Visit: Payer: Self-pay

## 2020-05-17 ENCOUNTER — Encounter: Payer: Self-pay | Admitting: Internal Medicine

## 2020-05-17 ENCOUNTER — Ambulatory Visit (INDEPENDENT_AMBULATORY_CARE_PROVIDER_SITE_OTHER): Payer: 59 | Admitting: Internal Medicine

## 2020-05-17 DIAGNOSIS — G4733 Obstructive sleep apnea (adult) (pediatric): Secondary | ICD-10-CM | POA: Diagnosis not present

## 2020-05-17 DIAGNOSIS — I480 Paroxysmal atrial fibrillation: Secondary | ICD-10-CM | POA: Diagnosis not present

## 2020-05-17 NOTE — Patient Instructions (Signed)
We can continue CPAP auto 5-20, mask of choice, humidifier,supplies, Airview/ card  Please call if we can help 

## 2020-05-19 ENCOUNTER — Encounter: Payer: Self-pay | Admitting: Family Medicine

## 2020-06-16 ENCOUNTER — Encounter: Payer: Self-pay | Admitting: Gastroenterology

## 2020-08-28 ENCOUNTER — Encounter: Payer: Self-pay | Admitting: Internal Medicine

## 2020-08-28 NOTE — Assessment & Plan Note (Signed)
Benefits from CPAP with good compliance and control Plan- continue auto 5-20 

## 2020-08-28 NOTE — Assessment & Plan Note (Signed)
He denies new events or significant change in cardiac status. Followed by cardiology

## 2020-09-01 ENCOUNTER — Encounter: Payer: 59 | Admitting: Gastroenterology

## 2020-10-04 ENCOUNTER — Ambulatory Visit: Payer: BC Managed Care – PPO | Admitting: Dermatology

## 2020-10-04 ENCOUNTER — Other Ambulatory Visit: Payer: Self-pay

## 2020-10-04 DIAGNOSIS — L570 Actinic keratosis: Secondary | ICD-10-CM | POA: Diagnosis not present

## 2020-10-04 DIAGNOSIS — D485 Neoplasm of uncertain behavior of skin: Secondary | ICD-10-CM | POA: Diagnosis not present

## 2020-10-04 DIAGNOSIS — Z1283 Encounter for screening for malignant neoplasm of skin: Secondary | ICD-10-CM | POA: Diagnosis not present

## 2020-10-04 DIAGNOSIS — Z85828 Personal history of other malignant neoplasm of skin: Secondary | ICD-10-CM

## 2020-10-04 NOTE — Patient Instructions (Signed)

## 2020-10-13 ENCOUNTER — Other Ambulatory Visit: Payer: Self-pay

## 2020-10-13 ENCOUNTER — Ambulatory Visit (AMBULATORY_SURGERY_CENTER): Payer: Self-pay | Admitting: *Deleted

## 2020-10-13 ENCOUNTER — Encounter: Payer: Self-pay | Admitting: Gastroenterology

## 2020-10-13 VITALS — Ht 72.0 in | Wt 250.0 lb

## 2020-10-13 DIAGNOSIS — Z1211 Encounter for screening for malignant neoplasm of colon: Secondary | ICD-10-CM

## 2020-10-13 MED ORDER — PEG 3350-KCL-NA BICARB-NACL 420 G PO SOLR: 4000.0000 mL | Freq: Once | ORAL | 0 refills | Status: AC

## 2020-10-13 NOTE — Progress Notes (Signed)
Patient is here in-person for PV. Patient denies any allergies to eggs or soy. Patient denies any problems with anesthesia/sedation. Patient is not on any oxygen at home. Patient is not taking any diet/weight loss medications or blood thinners. Patient is aware of our care-partner policy and Covid-19 safety protocol.   EMMI education assigned to the patient for the procedure, sent to MyChart.   Patient is COVID-19 vaccinated.  

## 2020-10-14 ENCOUNTER — Encounter: Payer: Self-pay | Admitting: Dermatology

## 2020-10-14 NOTE — Progress Notes (Signed)
   Follow-Up Visit   Subjective  [name]. is a 60 y.o. male who presents for the following: Skin Problem (Has a crusty lesion on back right calf. X awhile. History of non mole skin cancers. ).  General skin examination, new growth on right lower leg Location:  Duration:  Quality:  Associated Signs/Symptoms: Modifying Factors:  Severity:  Timing: Context:   Objective  Well appearing patient in no apparent distress; mood and affect are within normal limits. Left Forearm - Anterior, Right Ear Gritty 2 to 4 mm pink crusts  Right Lower Leg - Posterior 1 cm inflamed pink crust, superficial carcinoma versus I SK       Mid Back Waist up skin exam.  No atypical pigmented lesions.  1 possible nonmelanoma skin cancer on leg will be biopsied and treated.    A full examination was performed including scalp, head, eyes, ears, nose, lips, neck, chest, axillae, abdomen, back, buttocks, bilateral upper extremities, bilateral lower extremities, hands, feet, fingers, toes, fingernails, and toenails. All findings within normal limits unless otherwise noted below.  Areas beneath undergarments not examined.   Assessment & Plan    AK (actinic keratosis) (2) Right Ear; Left Forearm - Anterior  Destruction of lesion - Left Forearm - Anterior, Right Ear Complexity: simple   Destruction method: cryotherapy   Informed consent: discussed and consent obtained   Timeout:  patient name, date of birth, surgical site, and procedure verified Lesion destroyed using liquid nitrogen: Yes   Cryotherapy cycles:  3 Outcome: patient tolerated procedure well with no complications   Post-procedure details: wound care instructions given    Neoplasm of uncertain behavior of skin Right Lower Leg - Posterior  Skin / nail biopsy Type of biopsy: tangential   Informed consent: discussed and consent obtained   Timeout: patient name, date of birth, surgical site, and procedure verified   Anesthesia:  the lesion was anesthetized in a standard fashion   Anesthetic:  1% lidocaine w/ epinephrine 1-100,000 local infiltration Instrument used: flexible razor blade   Hemostasis achieved with: ferric subsulfate   Outcome: patient tolerated procedure well   Post-procedure details: wound care instructions given    Destruction of lesion Complexity: simple   Destruction method: electrodesiccation and curettage   Informed consent: discussed and consent obtained   Timeout:  patient name, date of birth, surgical site, and procedure verified Anesthesia: the lesion was anesthetized in a standard fashion   Anesthetic:  1% lidocaine w/ epinephrine 1-100,000 local infiltration Curettage performed in three different directions: Yes   Curettage cycles:  3 Lesion length (cm):  1 Lesion width (cm):  1 Margin per side (cm):  0 Final wound size (cm):  1 Hemostasis achieved with:  aluminum chloride Outcome: patient tolerated procedure well with no complications   Post-procedure details: wound care instructions given    Specimen 1 - Surgical pathology Differential Diagnosis: wart, SCC v BCC  Check Margins: No  After shave biopsy the base of the lesion was treated with curettage plus cautery  Screening for malignant neoplasm of skin Mid Back  Yearly skin exam.  Encouraged to self examine twice annually.  Continue ultraviolet protection.      I, Lavonna Monarch, MD, have reviewed all documentation for this visit.  The documentation on 10/14/20 for the exam, diagnosis, procedures, and orders are all accurate and complete.

## 2020-10-16 ENCOUNTER — Other Ambulatory Visit: Payer: Self-pay | Admitting: Family

## 2020-10-16 DIAGNOSIS — I10 Essential (primary) hypertension: Secondary | ICD-10-CM

## 2020-10-21 ENCOUNTER — Ambulatory Visit: Payer: BC Managed Care – PPO | Admitting: Family

## 2020-10-21 ENCOUNTER — Other Ambulatory Visit: Payer: Self-pay

## 2020-10-21 ENCOUNTER — Encounter: Payer: Self-pay | Admitting: Family

## 2020-10-21 VITALS — BP 133/85 | HR 62 | Temp 98.2°F | Ht 72.0 in | Wt 250.6 lb

## 2020-10-21 DIAGNOSIS — E785 Hyperlipidemia, unspecified: Secondary | ICD-10-CM

## 2020-10-21 DIAGNOSIS — Z6832 Body mass index (BMI) 32.0-32.9, adult: Secondary | ICD-10-CM

## 2020-10-21 DIAGNOSIS — Z23 Encounter for immunization: Secondary | ICD-10-CM | POA: Diagnosis not present

## 2020-10-21 DIAGNOSIS — I10 Essential (primary) hypertension: Secondary | ICD-10-CM

## 2020-10-21 DIAGNOSIS — E669 Obesity, unspecified: Secondary | ICD-10-CM | POA: Diagnosis not present

## 2020-10-21 DIAGNOSIS — Z0001 Encounter for general adult medical examination with abnormal findings: Secondary | ICD-10-CM | POA: Diagnosis not present

## 2020-10-21 DIAGNOSIS — E66811 Obesity, class 1: Secondary | ICD-10-CM

## 2020-10-21 DIAGNOSIS — Z0000 Encounter for general adult medical examination without abnormal findings: Secondary | ICD-10-CM

## 2020-10-21 MED ORDER — METOPROLOL SUCCINATE ER 25 MG PO TB24: 25.0000 mg | ORAL_TABLET | Freq: Every day | ORAL | 4 refills | Status: DC

## 2020-10-21 MED ORDER — AMLODIPINE BESY-BENAZEPRIL HCL 5-10 MG PO CAPS: 1.0000 | ORAL_CAPSULE | Freq: Every day | ORAL | 4 refills | Status: DC

## 2020-10-21 NOTE — Progress Notes (Signed)
Subjective:    Patient ID: [name]., male    DOB: 10-Nov-1960, 60 y.o.   MRN: [id number]  Chief Complaint  Patient presents with   Medical Management of Chronic Issues   PT presents to the office today for CPE.  Pt is followed by Cardiologists annually for hx of MI. Has colonoscopy scheduled next week.  Hypertension This is a chronic problem. The current episode started more than 1 year ago. The problem has been resolved since onset. The problem is controlled. Pertinent negatives include no malaise/fatigue, peripheral edema or shortness of breath. Risk factors for coronary artery disease include dyslipidemia and obesity. The current treatment provides moderate improvement. Hypertensive end-organ damage includes CAD/MI. There is no history of CVA or heart failure.  Hyperlipidemia This is a chronic problem. The current episode started more than 1 year ago. Exacerbating diseases include obesity. Pertinent negatives include no shortness of breath. Current antihyperlipidemic treatment includes diet change. The current treatment provides no improvement of lipids. Risk factors for coronary artery disease include dyslipidemia, male sex, a sedentary lifestyle and hypertension.  OSA Uses CPAP every night. Stable.    Review of Systems  Constitutional:  Negative for malaise/fatigue.  Respiratory:  Negative for shortness of breath.   All other systems reviewed and are negative.     Family History  Problem Relation Age of Onset   Colon cancer Neg Hx    Colon polyps Neg Hx    Esophageal cancer Neg Hx    Rectal cancer Neg Hx    Stomach cancer Neg Hx    Social History   Socioeconomic History   Marital status: Married    Spouse name: Not on file   Number of children: Not on file   Years of education: Not on file   Highest education level: Not on file  Occupational History   Not on file  Tobacco Use   Smoking status: Former    Packs/day: 0.50    Years: 2.00    Pack years: 1.00     Types: Cigarettes    Quit date: 01/17/1983    Years since quitting: 37.7   Smokeless tobacco: Never  Vaping Use   Vaping Use: Never used  Substance and Sexual Activity   Alcohol use: No   Drug use: No   Sexual activity: Not on file  Other Topics Concern   Not on file  Social History Narrative   Not on file   Social Determinants of Health   Financial Resource Strain: Not on file  Food Insecurity: Not on file  Transportation Needs: Not on file  Physical Activity: Not on file  Stress: Not on file  Social Connections: Not on file    Objective:   Physical Exam Vitals reviewed.  Constitutional:      General: He is not in acute distress.    Appearance: He is well-developed. He is obese.  HENT:     Head: Normocephalic.     Right Ear: Tympanic membrane normal.     Left Ear: Tympanic membrane normal.  Eyes:     General:        Right eye: No discharge.        Left eye: No discharge.     Pupils: Pupils are equal, round, and reactive to light.  Neck:     Thyroid: No thyromegaly.  Cardiovascular:     Rate and Rhythm: Normal rate and regular rhythm.     Heart sounds: Normal heart sounds. No murmur heard.  Pulmonary:     Effort: Pulmonary effort is normal. No respiratory distress.     Breath sounds: Normal breath sounds. No wheezing.  Abdominal:     General: Bowel sounds are normal. There is no distension.     Palpations: Abdomen is soft.     Tenderness: There is no abdominal tenderness.  Musculoskeletal:        General: No tenderness. Normal range of motion.     Cervical back: Normal range of motion and neck supple.  Skin:    General: Skin is warm and dry.     Findings: No erythema or rash.  Neurological:     Mental Status: He is alert and oriented to person, place, and time.     Cranial Nerves: No cranial nerve deficit.     Deep Tendon Reflexes: Reflexes are normal and symmetric.  Psychiatric:        Behavior: Behavior normal.        Thought Content: Thought content  normal.        Judgment: Judgment normal.     BP 133/85   Pulse 62   Temp 98.2 F (36.8 C) (Temporal)   Ht 6' (1.829 m)   Wt 250 lb 9.6 oz (113.7 kg)   BMI 33.99 kg/m       Assessment & Plan:   [name]. comes in today with chief complaint of Medical Management of Chronic Issues   Diagnosis and orders addressed:  1. Need for immunization against influenza - Flu Vaccine QUAD 85moIM (Fluarix, Fluzone & Alfiuria Quad PF) - CMP14+EGFR - CBC with Differential/Platelet  2. Essential hypertension - CMP14+EGFR - CBC with Differential/Platelet - amLODipine-benazepril (LOTREL) 5-10 MG capsule; Take 1 capsule by mouth daily.  Dispense: 90 capsule; Refill: 4 - metoprolol succinate (TOPROL-XL) 25 MG 24 hr tablet; Take 1 tablet (25 mg total) by mouth daily.  Dispense: 90 tablet; Refill: 4  3. Hyperlipidemia, unspecified hyperlipidemia type - CMP14+EGFR - CBC with Differential/Platelet  4. Class 1 obesity with serious comorbidity and body mass index (BMI) of 32.0 to 32.9 in adult, unspecified obesity type - CMP14+EGFR - CBC with Differential/Platelet  5. Annual physical exam - CMP14+EGFR - CBC with Differential/Platelet - Lipid panel - PSA, total and free - TSH   Labs pending Health Maintenance reviewed Diet and exercise encouraged  Follow up plan:  1 year   CEvelina Dun FNP

## 2020-10-21 NOTE — Patient Instructions (Signed)

## 2020-10-22 LAB — LIPID PANEL
Chol/HDL Ratio: 4.7 ratio (ref 0.0–5.0)
Cholesterol, Total: 150 mg/dL (ref 100–199)
HDL: 32 mg/dL — ABNORMAL LOW (ref >39)
LDL Chol Calc (NIH): 78 mg/dL (ref 0–99)
Triglycerides: 239 mg/dL — ABNORMAL HIGH (ref 0–149)
VLDL Cholesterol Cal: 40 mg/dL (ref 5–40)

## 2020-10-22 LAB — CMP14+EGFR
ALT: 20 IU/L (ref 0–44)
AST: 19 IU/L (ref 0–40)
Albumin/Globulin Ratio: 2.3 — ABNORMAL HIGH (ref 1.2–2.2)
Albumin: 4.6 g/dL (ref 3.8–4.8)
Alkaline Phosphatase: 83 IU/L (ref 44–121)
BUN/Creatinine Ratio: 12 (ref 10–24)
BUN: 9 mg/dL (ref 8–27)
Bilirubin Total: 0.6 mg/dL (ref 0.0–1.2)
CO2: 24 mmol/L (ref 20–29)
Calcium: 9.3 mg/dL (ref 8.6–10.2)
Chloride: 104 mmol/L (ref 96–106)
Creatinine, Ser: 0.78 mg/dL (ref 0.76–1.27)
Globulin, Total: 2 g/dL (ref 1.5–4.5)
Glucose: 95 mg/dL (ref 70–99)
Potassium: 4.6 mmol/L (ref 3.5–5.2)
Sodium: 142 mmol/L (ref 134–144)
Total Protein: 6.6 g/dL (ref 6.0–8.5)
eGFR: 101 mL/min/{1.73_m2} (ref >59)

## 2020-10-22 LAB — CBC WITH DIFFERENTIAL/PLATELET
Basophils Absolute: 0 10*3/uL (ref 0.0–0.2)
Basos: 1 %
EOS (ABSOLUTE): 0.2 10*3/uL (ref 0.0–0.4)
Eos: 2 %
Hematocrit: 46.6 % (ref 37.5–51.0)
Hemoglobin: 15.7 g/dL (ref 13.0–17.7)
Immature Grans (Abs): 0 10*3/uL (ref 0.0–0.1)
Immature Granulocytes: 0 %
Lymphocytes Absolute: 2.3 10*3/uL (ref 0.7–3.1)
Lymphs: 30 %
MCH: 30 pg (ref 26.6–33.0)
MCHC: 33.7 g/dL (ref 31.5–35.7)
MCV: 89 fL (ref 79–97)
Monocytes Absolute: 1.1 10*3/uL — ABNORMAL HIGH (ref 0.1–0.9)
Monocytes: 15 %
Neutrophils Absolute: 4.1 10*3/uL (ref 1.4–7.0)
Neutrophils: 52 %
Platelets: 228 10*3/uL (ref 150–450)
RBC: 5.23 x10E6/uL (ref 4.14–5.80)
RDW: 12.8 % (ref 11.6–15.4)
WBC: 7.7 10*3/uL (ref 3.4–10.8)

## 2020-10-22 LAB — TSH: TSH: 1.56 u[IU]/mL (ref 0.450–4.500)

## 2020-10-22 LAB — PSA, TOTAL AND FREE
PSA, Free Pct: 19.1 %
PSA, Free: 0.21 ng/mL
Prostate Specific Ag, Serum: 1.1 ng/mL (ref 0.0–4.0)

## 2020-10-27 ENCOUNTER — Ambulatory Visit (AMBULATORY_SURGERY_CENTER): Payer: BC Managed Care – PPO | Admitting: Gastroenterology

## 2020-10-27 ENCOUNTER — Encounter: Payer: Self-pay | Admitting: Gastroenterology

## 2020-10-27 ENCOUNTER — Other Ambulatory Visit: Payer: Self-pay

## 2020-10-27 VITALS — BP 122/66 | HR 72 | Temp 98.9°F | Resp 10 | Ht 72.0 in | Wt 250.0 lb

## 2020-10-27 DIAGNOSIS — D123 Benign neoplasm of transverse colon: Secondary | ICD-10-CM

## 2020-10-27 DIAGNOSIS — Z1211 Encounter for screening for malignant neoplasm of colon: Secondary | ICD-10-CM | POA: Diagnosis present

## 2020-10-27 MED ORDER — SODIUM CHLORIDE 0.9 % IV SOLN: 500.0000 mL | Freq: Once | INTRAVENOUS | Status: DC

## 2020-10-27 NOTE — Progress Notes (Signed)
Pt's states no medical or surgical changes since previsit or office visit. 

## 2020-10-27 NOTE — Progress Notes (Signed)
HPI: This is a man at routine risk for CRC   ROS: complete GI ROS as described in HPI, all other review negative.  Constitutional:  No unintentional weight loss   Past Medical History:  Diagnosis Date   Atypical mole 06/08/2017   left neck - widershave   Hyperlipemia    Hypertension    on meds for 8 years   SCC (squamous cell carcinoma) 09/06/1977   Left foream   SCC (squamous cell carcinoma) 01/31/2018   Right forearm - tx p bx   Sleep apnea    uses CPAP   Squamous cell carcinoma of skin 05/06/2015   Right forearm - CX3 + 5FU    Past Surgical History:  Procedure Laterality Date   BACK SURGERY     CARDIOVASCULAR STRESS TEST     COLONOSCOPY  2011   in [place]-normal exam per pt   JOINT REPLACEMENT     KNEE ARTHROSCOPY     KNEE ARTHROSCOPY WITH PATELLAR TENDON REPAIR     rt knee replacement  01/16/2006   TOTAL KNEE ARTHROPLASTY  02/16/2012   Procedure: TOTAL KNEE ARTHROPLASTY;  Surgeon: Augustin Schooling, MD;  Location: [facility];  Service: Orthopedics;  Laterality: Left;    Current Outpatient Medications  Medication Sig Dispense Refill   amLODipine-benazepril (LOTREL) 5-10 MG capsule Take 1 capsule by mouth daily. 90 capsule 4   aspirin EC 81 MG tablet Take 81 mg by mouth daily.     metoprolol succinate (TOPROL-XL) 25 MG 24 hr tablet Take 1 tablet (25 mg total) by mouth daily. 90 tablet 4   VITAMIN D PO Take by mouth.     Current Facility-Administered Medications  Medication Dose Route Frequency Provider Last Rate Last Admin   0.9 %  sodium chloride infusion  500 mL Intravenous Once Milus Banister, MD        Allergies as of 10/27/2020   (No Known Allergies)    Family History  Problem Relation Age of Onset   Colon cancer Neg Hx    Colon polyps Neg Hx    Esophageal cancer Neg Hx    Rectal cancer Neg Hx    Stomach cancer Neg Hx     Social History   Socioeconomic History   Marital status: Married    Spouse name: Not on file   Number of children: Not  on file   Years of education: Not on file   Highest education level: Not on file  Occupational History   Not on file  Tobacco Use   Smoking status: Former    Packs/day: 0.50    Years: 2.00    Pack years: 1.00    Types: Cigarettes    Quit date: 01/17/1983    Years since quitting: 37.8   Smokeless tobacco: Never  Vaping Use   Vaping Use: Never used  Substance and Sexual Activity   Alcohol use: No   Drug use: No   Sexual activity: Not on file  Other Topics Concern   Not on file  Social History Narrative   Not on file   Social Determinants of Health   Financial Resource Strain: Not on file  Food Insecurity: Not on file  Transportation Needs: Not on file  Physical Activity: Not on file  Stress: Not on file  Social Connections: Not on file  Intimate Partner Violence: Not on file     Physical Exam: BP 107/65   Pulse 84   Temp 98.9 F (37.2 C)   Ht  6' (1.829 m)   Wt 250 lb (113.4 kg)   SpO2 96%   BMI 33.91 kg/m  Constitutional: generally well-appearing Psychiatric: alert and oriented x3 Lungs: CTA bilaterally Heart: no MCR  Assessment and plan: 60 y.o. male with routine risk for CTC   Colonsocoyp today.  Care is appropriate for the ambulatory setting.  Owens Loffler, MD [facility] 10/27/2020, 8:35 AM

## 2020-10-27 NOTE — Progress Notes (Signed)
PT taken to PACU. Monitors in place. VSS. Report given to RN. 

## 2020-10-27 NOTE — Patient Instructions (Signed)
Handouts on diverticulosis and polyps given to patient. Await pathology results.   YOU HAD AN ENDOSCOPIC PROCEDURE TODAY AT [facility]:   Refer to the procedure report that was given to you for any specific questions about what was found during the examination.  If the procedure report does not answer your questions, please call your gastroenterologist to clarify.  If you requested that your care partner not be given the details of your procedure findings, then the procedure report has been included in a sealed envelope for you to review at your convenience later.  YOU SHOULD EXPECT: Some feelings of bloating in the abdomen. Passage of more gas than usual.  Walking can help get rid of the air that was put into your GI tract during the procedure and reduce the bloating. If you had a lower endoscopy (such as a colonoscopy or flexible sigmoidoscopy) you may notice spotting of blood in your stool or on the toilet paper. If you underwent a bowel prep for your procedure, you may not have a normal bowel movement for a few days.  Please Note:  You might notice some irritation and congestion in your nose or some drainage.  This is from the oxygen used during your procedure.  There is no need for concern and it should clear up in a day or so.  SYMPTOMS TO REPORT IMMEDIATELY:  Following lower endoscopy (colonoscopy or flexible sigmoidoscopy):  Excessive amounts of blood in the stool  Significant tenderness or worsening of abdominal pains  Swelling of the abdomen that is new, acute  Fever of 100F or higher   For urgent or emergent issues, a gastroenterologist can be reached at any hour by calling [phone]. Do not use MyChart messaging for urgent concerns.    DIET:  We do recommend a small meal at first, but then you may proceed to your regular diet.  Drink plenty of fluids but you should avoid alcoholic beverages for 24 hours.  ACTIVITY:  You should plan to take it easy for the  rest of today and you should NOT DRIVE or use heavy machinery until tomorrow (because of the sedation medicines used during the test).    FOLLOW UP: Our staff will call the number listed on your records 48-72 hours following your procedure to check on you and address any questions or concerns that you may have regarding the information given to you following your procedure. If we do not reach you, we will leave a message.  We will attempt to reach you two times.  During this call, we will ask if you have developed any symptoms of COVID 19. If you develop any symptoms (ie: fever, flu-like symptoms, shortness of breath, cough etc.) before then, please call [phone].  If you test positive for Covid 19 in the 2 weeks post procedure, please call and report this information to [place].    If any biopsies were taken you will be contacted by phone or by letter within the next 1-3 weeks.  Please call us at [phone] if you have not heard about the biopsies in 3 weeks.    SIGNATURES/CONFIDENTIALITY: You and/or your care partner have signed paperwork which will be entered into your electronic medical record.  These signatures attest to the fact that that the information above on your After Visit Summary has been reviewed and is understood.  Full responsibility of the confidentiality of this discharge information lies with you and/or your care-partner.

## 2020-10-27 NOTE — Progress Notes (Signed)
Called to room to assist during endoscopic procedure.  Patient ID and intended procedure confirmed with present staff. Received instructions for my participation in the procedure from the performing physician.  

## 2020-10-27 NOTE — Op Note (Signed)
[facility] Patient Name: [name] Procedure Date: 10/27/2020 8:23 AM MRN: [id number] Endoscopist: Milus Banister , MD Age: 60 Referring MD:  Date of Birth: 1960-01-29 Gender: Male Account #: [id number] Procedure:                Colonoscopy Indications:              Screening for colorectal malignant neoplasm Medicines:                Monitored Anesthesia Care Procedure:                Pre-Anesthesia Assessment:                           - Prior to the procedure, a History and Physical                            was performed, and patient medications and                            allergies were reviewed. The patient's tolerance of                            previous anesthesia was also reviewed. The risks                            and benefits of the procedure and the sedation                            options and risks were discussed with the patient.                            All questions were answered, and informed consent                            was obtained. Prior Anticoagulants: The patient has                            taken no previous anticoagulant or antiplatelet                            agents. ASA Grade Assessment: II - A patient with                            mild systemic disease. After reviewing the risks                            and benefits, the patient was deemed in                            satisfactory condition to undergo the procedure.                           After obtaining informed consent, the colonoscope  was passed under direct vision. Throughout the                            procedure, the patient's blood pressure, pulse, and                            oxygen saturations were monitored continuously. The                            Olympus CF-HQ190L (Serial# [id number]) Colonoscope was                            introduced through the anus and advanced to the the                            cecum, identified  by appendiceal orifice and                            ileocecal valve. The colonoscopy was performed                            without difficulty. The patient tolerated the                            procedure well. The quality of the bowel                            preparation was good. The ileocecal valve,                            appendiceal orifice, and rectum were photographed. Scope In: 8:41:25 AM Scope Out: 8:55:59 AM Scope Withdrawal Time: 0 hours 12 minutes 26 seconds  Total Procedure Duration: 0 hours 14 minutes 34 seconds  Findings:                 A 4 mm polyp was found in the transverse colon. The                            polyp was sessile. The polyp was removed with a                            cold snare. Resection and retrieval were complete.                           Multiple small and large-mouthed diverticula were                            found in the left colon.                           The exam was otherwise without abnormality on                            direct and retroflexion views. Complications:  No immediate complications. Estimated blood loss:                            None. Estimated Blood Loss:     Estimated blood loss: none. Impression:               - One 4 mm polyp in the transverse colon, removed                            with a cold snare. Resected and retrieved.                           - Diverticulosis in the left colon.                           - The examination was otherwise normal on direct                            and retroflexion views. Recommendation:           - Patient has a contact number available for                            emergencies. The signs and symptoms of potential                            delayed complications were discussed with the                            patient. Return to normal activities tomorrow.                            Written discharge instructions were provided to the                             patient.                           - Resume previous diet.                           - Continue present medications.                           - Await pathology results. Milus Banister, MD 10/27/2020 8:58:22 AM This report has been signed electronically.

## 2020-10-29 ENCOUNTER — Telehealth: Payer: Self-pay | Admitting: *Deleted

## 2020-10-29 NOTE — Telephone Encounter (Signed)
  Follow up Call-  Call back number 10/27/2020  Post procedure Call Back phone  # [phone]  Permission to leave phone message Yes  Some recent data might be hidden     Patient questions:  Do you have a fever, pain , or abdominal swelling? No. Pain Score  0 *  Have you tolerated food without any problems? Yes.    Have you been able to return to your normal activities? Yes.    Do you have any questions about your discharge instructions: Diet   No. Medications  No. Follow up visit  No.  Do you have questions or concerns about your Care? Yes.    Actions: * If pain score is 4 or above: No action needed, pain <4.  Have you developed a fever since your procedure? no  2.   Have you had an respiratory symptoms (SOB or cough) since your procedure? no  3.   Have you tested positive for COVID 19 since your procedure no   4.   Have you had any family members/close contacts diagnosed with the COVID 19 since your procedure?  no   If yes to any of these questions please route to Joylene John, RN and Joella Prince, RN

## 2020-11-04 ENCOUNTER — Encounter: Payer: Self-pay | Admitting: Gastroenterology

## 2020-11-15 ENCOUNTER — Inpatient Hospital Stay (HOSPITAL_COMMUNITY)
Admission: EM | Admit: 2020-11-15 | Discharge: 2020-11-17 | DRG: 280 | Disposition: A | Discharge status: Home / Self Care | Payer: BC Managed Care – PPO | Attending: Cardiology | Admitting: Cardiology

## 2020-11-15 ENCOUNTER — Other Ambulatory Visit: Payer: Self-pay

## 2020-11-15 ENCOUNTER — Emergency Department (HOSPITAL_COMMUNITY): Payer: BC Managed Care – PPO

## 2020-11-15 DIAGNOSIS — I480 Paroxysmal atrial fibrillation: Secondary | ICD-10-CM | POA: Diagnosis present

## 2020-11-15 DIAGNOSIS — I214 Non-ST elevation (NSTEMI) myocardial infarction: Principal | ICD-10-CM | POA: Diagnosis present

## 2020-11-15 DIAGNOSIS — Z6832 Body mass index (BMI) 32.0-32.9, adult: Secondary | ICD-10-CM

## 2020-11-15 DIAGNOSIS — U071 COVID-19: Secondary | ICD-10-CM | POA: Diagnosis present

## 2020-11-15 DIAGNOSIS — Z85828 Personal history of other malignant neoplasm of skin: Secondary | ICD-10-CM

## 2020-11-15 DIAGNOSIS — Z79899 Other long term (current) drug therapy: Secondary | ICD-10-CM

## 2020-11-15 DIAGNOSIS — Z96651 Presence of right artificial knee joint: Secondary | ICD-10-CM | POA: Diagnosis present

## 2020-11-15 DIAGNOSIS — E7800 Pure hypercholesterolemia, unspecified: Secondary | ICD-10-CM | POA: Diagnosis present

## 2020-11-15 DIAGNOSIS — E669 Obesity, unspecified: Secondary | ICD-10-CM | POA: Diagnosis present

## 2020-11-15 DIAGNOSIS — E785 Hyperlipidemia, unspecified: Secondary | ICD-10-CM | POA: Diagnosis present

## 2020-11-15 DIAGNOSIS — I10 Essential (primary) hypertension: Secondary | ICD-10-CM | POA: Diagnosis present

## 2020-11-15 DIAGNOSIS — G4733 Obstructive sleep apnea (adult) (pediatric): Secondary | ICD-10-CM | POA: Diagnosis present

## 2020-11-15 DIAGNOSIS — I252 Old myocardial infarction: Secondary | ICD-10-CM

## 2020-11-15 DIAGNOSIS — Z87891 Personal history of nicotine dependence: Secondary | ICD-10-CM

## 2020-11-15 DIAGNOSIS — Z7982 Long term (current) use of aspirin: Secondary | ICD-10-CM

## 2020-11-15 NOTE — ED Triage Notes (Signed)
Anterior central chest pain described as an ache 10/10, profuse diaphoresis, sob, and nausea. Pain started at 2200. EMS gave 4 nitro and brought pain down to 4/10. Pt took 4 baby aspirin pta.

## 2020-11-15 NOTE — ED Triage Notes (Signed)
Pt reports he took a trip to [place] two weeks ago and a had a "little cold" last week.

## 2020-11-15 NOTE — ED Notes (Signed)
Patient getting X-ray at bedside.   

## 2020-11-15 NOTE — ED Provider Notes (Signed)
[facility] EMERGENCY DEPARTMENT Provider Note   CSN: [id number] Arrival date & time: 11/15/20  2248     History Chief Complaint  Patient presents with   Chest Pain    [name]. is a 60 y.o. male.  Patient presents to the emergency department for evaluation of chest pain.  Patient reports that he had sudden onset of diffuse chest aching pain while in the shower.  Pain was 10 out of 10.  Patient had associated diaphoresis, nausea.  He called EMS.  They noted him to have slightly low oxygen saturations, placed him on oxygen.  He did not feel short of breath at the time.  No pleuritic pain.  Patient was given for low-dose aspirin, 4 nitroglycerin.  Patient reports that his pain is much improved now, down to 4 out of 10.  He does not have any cardiac history.   HPI: A 60 year old patient with a history of hypertension, hypercholesterolemia and obesity presents for evaluation of chest pain. Initial onset of pain was approximately 1-3 hours ago. The patient's chest pain is described as heaviness/pressure/tightness, is not worse with exertion and is relieved by nitroglycerin. The patient complains of nausea and reports some diaphoresis. The patient's chest pain is middle- or left-sided, is not well-localized, is not sharp and does not radiate to the arms/jaw/neck. The patient has no history of stroke, has no history of peripheral artery disease, has not smoked in the past 90 days, denies any history of treated diabetes and has no relevant family history of coronary artery disease (first degree relative at less than age 107).   Past Medical History:  Diagnosis Date   Atypical mole 06/08/2017   left neck - widershave   Hyperlipemia    Hypertension    on meds for 8 years   SCC (squamous cell carcinoma) 09/06/1977   Left foream   SCC (squamous cell carcinoma) 01/31/2018   Right forearm - tx p bx   Sleep apnea    uses CPAP   Squamous cell carcinoma of skin 05/06/2015   Right forearm -  CX3 + 5FU    Patient Active Problem List   Diagnosis Date Noted   Hx of smoking 11/19/2018   Paroxysmal atrial fibrillation ([place]) 09/13/2018   OSA (obstructive sleep apnea) 09/13/2018   Dysphagia 07/29/2018   Pain in left knee 04/09/2018   Obesity 02/01/2015   Benign lipomatous neoplasm of skin and subcutaneous tissue of trunk, right lower chest. 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/12/2009   Hyperlipidemia 05/12/2008   Essential hypertension 05/12/2008    Past Surgical History:  Procedure Laterality Date   BACK SURGERY     CARDIOVASCULAR STRESS TEST     COLONOSCOPY  2011   in [place]-normal exam per pt   JOINT REPLACEMENT     KNEE ARTHROSCOPY     KNEE ARTHROSCOPY WITH PATELLAR TENDON REPAIR     rt knee replacement  01/16/2006   TOTAL KNEE ARTHROPLASTY  02/16/2012   Procedure: TOTAL KNEE ARTHROPLASTY;  Surgeon: Augustin Schooling, MD;  Location: [place];  Service: Orthopedics;  Laterality: Left;       Family History  Problem Relation Age of Onset   Colon cancer Neg Hx    Colon polyps Neg Hx    Esophageal cancer Neg Hx    Rectal cancer Neg Hx    Stomach cancer Neg Hx     Social History   Tobacco Use  Smoking status: Former    Packs/day: 0.50    Years: 2.00    Pack years: 1.00    Types: Cigarettes    Quit date: 01/17/1983    Years since quitting: 37.8   Smokeless tobacco: Never  Vaping Use   Vaping Use: Never used  Substance Use Topics   Alcohol use: No   Drug use: No    Home Medications Prior to Admission medications   Medication Sig Start Date End Date Taking? Authorizing Provider  amLODipine-benazepril (LOTREL) 5-10 MG capsule Take 1 capsule by mouth daily. 10/21/20   [name] A, FNP  aspirin EC 81 MG tablet Take 81 mg by mouth daily.    [provider]  metoprolol succinate (TOPROL-XL) 25 MG 24 hr tablet Take 1 tablet (25 mg total) by mouth daily. 10/21/20   [name] A, FNP   VITAMIN D PO Take by mouth.    [provider]    Allergies    Patient has no known allergies.  Review of Systems   Review of Systems  Constitutional:  Positive for diaphoresis.  Cardiovascular:  Positive for chest pain.  Gastrointestinal:  Positive for nausea.  All other systems reviewed and are negative.  Physical Exam Updated Vital Signs BP 136/85   Pulse (!) 57   Temp 97.8 F (36.6 C) (Oral)   Resp 20   Ht 6' (1.829 m)   Wt 107 kg   SpO2 99%   BMI 32.01 kg/m   Physical Exam Vitals and nursing note reviewed.  Constitutional:      General: He is not in acute distress.    Appearance: Normal appearance. He is well-developed.  HENT:     Head: Normocephalic and atraumatic.     Right Ear: Hearing normal.     Left Ear: Hearing normal.     Nose: Nose normal.  Eyes:     Conjunctiva/sclera: Conjunctivae normal.     Pupils: Pupils are equal, round, and reactive to light.  Cardiovascular:     Rate and Rhythm: Regular rhythm.     Heart sounds: S1 normal and S2 normal. No murmur heard.   No friction rub. No gallop.  Pulmonary:     Effort: Pulmonary effort is normal. No respiratory distress.     Breath sounds: Normal breath sounds.  Chest:     Chest wall: No tenderness.  Abdominal:     General: Bowel sounds are normal.     Palpations: Abdomen is soft.     Tenderness: There is no abdominal tenderness. There is no guarding or rebound. Negative signs include Murphy's sign and McBurney's sign.     Hernia: No hernia is present.  Musculoskeletal:        General: Normal range of motion.     Cervical back: Normal range of motion and neck supple.  Skin:    General: Skin is warm and dry.     Findings: No rash.  Neurological:     Mental Status: He is alert and oriented to person, place, and time.     GCS: GCS eye subscore is 4. GCS verbal subscore is 5. GCS motor subscore is 6.     Cranial Nerves: No cranial nerve deficit.     Sensory: No sensory deficit.      Coordination: Coordination normal.  Psychiatric:        Speech: Speech normal.        Behavior: Behavior normal.        Thought Content: Thought content  normal.    ED Results / Procedures / Treatments   Labs (all labs ordered are listed, but only abnormal results are displayed) Labs Reviewed  COMPREHENSIVE METABOLIC PANEL - Abnormal; Notable for the following components:      Result Value   Glucose, Bld 117 (*)    All other components within normal limits  TROPONIN I (HIGH SENSITIVITY) - Abnormal; Notable for the following components:   Troponin I (High Sensitivity) 44 (*)    All other components within normal limits  RESP PANEL BY RT-PCR (FLU A&B, COVID) ARPGX2  CBC WITH DIFFERENTIAL/PLATELET  BRAIN NATRIURETIC PEPTIDE  D-DIMER, QUANTITATIVE  TROPONIN I (HIGH SENSITIVITY)    EKG EKG Interpretation  Date/Time:  Monday November 15 2020 22:55:24 EDT Ventricular Rate:  69 PR Interval:  223 QRS Duration: 97 QT Interval:  411 QTC Calculation: 441 R Axis:   -12 Text Interpretation: Sinus rhythm Prolonged PR interval Minimal ST elevation, inferior leads Confirmed by Orpah Greek ([id number]) on 11/15/2020 11:03:25 PM  Radiology DG Chest Port 1 View  Result Date: 11/15/2020 CLINICAL DATA:  Chest pain, dyspnea EXAM: PORTABLE CHEST 1 VIEW COMPARISON:  02/08/2012 FINDINGS: The heart size and mediastinal contours are within normal limits. Both lungs are clear. The visualized skeletal structures are unremarkable. IMPRESSION: No active disease. Electronically Signed   By: Fidela Salisbury M.D.   On: 11/15/2020 23:27    Procedures Procedures   Medications Ordered in ED Medications  nitroGLYCERIN 50 mg in dextrose 5 % 250 mL (0.2 mg/mL) infusion (has no administration in time range)    ED Course  I have reviewed the triage vital signs and the nursing notes.  Pertinent labs & imaging results that were available during my care of the patient were reviewed by me and considered in  my medical decision making (see chart for details).    MDM Rules/Calculators/A&P HEAR Score: 6                         Patient presents to the emergency department for evaluation of chest pain. Patient had onset of pain at rest earlier tonight.  Patient has been given aspirin and nitroglycerin prior to arrival.  Patient had improvement of his chest pain with nitroglycerin but did not arrive pain-free.  He has significant cardiac risk factors.  EKG with nonspecific ST changes.  First troponin negative, second troponin 44.  Will require hospitalization for further management of NSTEMI.  CRITICAL CARE Performed by: Orpah Greek   Total critical care time: 32 minutes  Critical care time was exclusive of separately billable procedures and treating other patients.  Critical care was necessary to treat or prevent imminent or life-threatening deterioration.  Critical care was time spent personally by me on the following activities: development of treatment plan with patient and/or surrogate as well as nursing, discussions with consultants, evaluation of patient's response to treatment, examination of patient, obtaining history from patient or surrogate, ordering and performing treatments and interventions, ordering and review of laboratory studies, ordering and review of radiographic studies, pulse oximetry and re-evaluation of patient's condition.  Final Clinical Impression(s) / ED Diagnoses Final diagnoses:  NSTEMI (non-ST elevated myocardial infarction) [facility])    Rx / DC Orders ED Discharge Orders     None        Green Quincy, Gwenyth Allegra, MD 11/16/20 [id number]

## 2020-11-16 ENCOUNTER — Encounter (HOSPITAL_COMMUNITY): Payer: Self-pay | Admitting: Cardiology

## 2020-11-16 ENCOUNTER — Encounter (HOSPITAL_COMMUNITY)
Admission: EM | Disposition: A | Discharge status: Home / Self Care | Payer: Self-pay | Source: Home / Self Care | Attending: Cardiology

## 2020-11-16 DIAGNOSIS — Z6832 Body mass index (BMI) 32.0-32.9, adult: Secondary | ICD-10-CM | POA: Diagnosis not present

## 2020-11-16 DIAGNOSIS — Z85828 Personal history of other malignant neoplasm of skin: Secondary | ICD-10-CM | POA: Diagnosis not present

## 2020-11-16 DIAGNOSIS — Z7982 Long term (current) use of aspirin: Secondary | ICD-10-CM | POA: Diagnosis not present

## 2020-11-16 DIAGNOSIS — E7800 Pure hypercholesterolemia, unspecified: Secondary | ICD-10-CM | POA: Diagnosis present

## 2020-11-16 DIAGNOSIS — E785 Hyperlipidemia, unspecified: Secondary | ICD-10-CM | POA: Diagnosis not present

## 2020-11-16 DIAGNOSIS — I200 Unstable angina: Secondary | ICD-10-CM

## 2020-11-16 DIAGNOSIS — E669 Obesity, unspecified: Secondary | ICD-10-CM | POA: Diagnosis present

## 2020-11-16 DIAGNOSIS — Z87891 Personal history of nicotine dependence: Secondary | ICD-10-CM | POA: Diagnosis not present

## 2020-11-16 DIAGNOSIS — I10 Essential (primary) hypertension: Secondary | ICD-10-CM

## 2020-11-16 DIAGNOSIS — I480 Paroxysmal atrial fibrillation: Secondary | ICD-10-CM | POA: Diagnosis present

## 2020-11-16 DIAGNOSIS — G4733 Obstructive sleep apnea (adult) (pediatric): Secondary | ICD-10-CM | POA: Diagnosis present

## 2020-11-16 DIAGNOSIS — Z96651 Presence of right artificial knee joint: Secondary | ICD-10-CM | POA: Diagnosis present

## 2020-11-16 DIAGNOSIS — U071 COVID-19: Secondary | ICD-10-CM

## 2020-11-16 DIAGNOSIS — I252 Old myocardial infarction: Secondary | ICD-10-CM | POA: Diagnosis not present

## 2020-11-16 DIAGNOSIS — I214 Non-ST elevation (NSTEMI) myocardial infarction: Secondary | ICD-10-CM | POA: Diagnosis present

## 2020-11-16 DIAGNOSIS — Z79899 Other long term (current) drug therapy: Secondary | ICD-10-CM | POA: Diagnosis not present

## 2020-11-16 HISTORY — PX: LEFT HEART CATH AND CORONARY ANGIOGRAPHY: CATH118249

## 2020-11-16 LAB — COMPREHENSIVE METABOLIC PANEL
ALT: 39 U/L (ref 0–44)
AST: 31 U/L (ref 15–41)
Albumin: 4.1 g/dL (ref 3.5–5.0)
Alkaline Phosphatase: 68 U/L (ref 38–126)
Anion gap: 9 (ref 5–15)
BUN: 11 mg/dL (ref 8–23)
CO2: 23 mmol/L (ref 22–32)
Calcium: 8.9 mg/dL (ref 8.9–10.3)
Chloride: 105 mmol/L (ref 98–111)
Creatinine, Ser: 0.97 mg/dL (ref 0.61–1.24)
GFR, Estimated: >60 mL/min (ref >60)
Glucose, Bld: 117 mg/dL — ABNORMAL HIGH (ref 70–99)
Potassium: 3.7 mmol/L (ref 3.5–5.1)
Sodium: 137 mmol/L (ref 135–145)
Total Bilirubin: 0.5 mg/dL (ref 0.3–1.2)
Total Protein: 7.2 g/dL (ref 6.5–8.1)

## 2020-11-16 LAB — CBC WITH DIFFERENTIAL/PLATELET
Abs Immature Granulocytes: 0.04 10*3/uL (ref 0.00–0.07)
Basophils Absolute: 0 10*3/uL (ref 0.0–0.1)
Basophils Relative: 0 %
Eosinophils Absolute: 0.2 10*3/uL (ref 0.0–0.5)
Eosinophils Relative: 3 %
HCT: 42.7 % (ref 39.0–52.0)
Hemoglobin: 15 g/dL (ref 13.0–17.0)
Immature Granulocytes: 0 %
Lymphocytes Relative: 37 %
Lymphs Abs: 3.4 10*3/uL (ref 0.7–4.0)
MCH: 31.9 pg (ref 26.0–34.0)
MCHC: 35.1 g/dL (ref 30.0–36.0)
MCV: 90.9 fL (ref 80.0–100.0)
Monocytes Absolute: 1 10*3/uL (ref 0.1–1.0)
Monocytes Relative: 11 %
Neutro Abs: 4.6 10*3/uL (ref 1.7–7.7)
Neutrophils Relative %: 49 %
Platelets: 228 10*3/uL (ref 150–400)
RBC: 4.7 MIL/uL (ref 4.22–5.81)
RDW: 12.8 % (ref 11.5–15.5)
WBC: 9.3 10*3/uL (ref 4.0–10.5)
nRBC: 0 % (ref 0.0–0.2)

## 2020-11-16 LAB — CBC
HCT: 41.3 % (ref 39.0–52.0)
Hemoglobin: 14.2 g/dL (ref 13.0–17.0)
MCH: 30.7 pg (ref 26.0–34.0)
MCHC: 34.4 g/dL (ref 30.0–36.0)
MCV: 89.2 fL (ref 80.0–100.0)
Platelets: 208 10*3/uL (ref 150–400)
RBC: 4.63 MIL/uL (ref 4.22–5.81)
RDW: 13 % (ref 11.5–15.5)
WBC: 14.7 10*3/uL — ABNORMAL HIGH (ref 4.0–10.5)
nRBC: 0 % (ref 0.0–0.2)

## 2020-11-16 LAB — CREATININE, SERUM
Creatinine, Ser: 0.74 mg/dL (ref 0.61–1.24)
GFR, Estimated: >60 mL/min (ref >60)

## 2020-11-16 LAB — RESP PANEL BY RT-PCR (FLU A&B, COVID) ARPGX2
Influenza A by PCR: NEGATIVE
Influenza B by PCR: NEGATIVE
SARS Coronavirus 2 by RT PCR: POSITIVE — AB

## 2020-11-16 LAB — HEPARIN LEVEL (UNFRACTIONATED)
Heparin Unfractionated: 0.24 IU/mL — ABNORMAL LOW (ref 0.30–0.70)
Heparin Unfractionated: 0.39 IU/mL (ref 0.30–0.70)
Heparin Unfractionated: 0.19 IU/mL — ABNORMAL LOW (ref 0.30–0.70)

## 2020-11-16 LAB — TROPONIN I (HIGH SENSITIVITY)
Troponin I (High Sensitivity): 9 ng/L (ref <18)
Troponin I (High Sensitivity): 44 ng/L — ABNORMAL HIGH (ref <18)
Troponin I (High Sensitivity): 4060 ng/L (ref <18)
Troponin I (High Sensitivity): 4009 ng/L (ref <18)

## 2020-11-16 LAB — HIV ANTIBODY (ROUTINE TESTING W REFLEX): HIV Screen 4th Generation wRfx: NONREACTIVE

## 2020-11-16 LAB — D-DIMER, QUANTITATIVE: D-Dimer, Quant: 0.44 ug/mL-FEU (ref 0.00–0.50)

## 2020-11-16 LAB — BRAIN NATRIURETIC PEPTIDE: B Natriuretic Peptide: 61 pg/mL (ref 0.0–100.0)

## 2020-11-16 SURGERY — LEFT HEART CATH AND CORONARY ANGIOGRAPHY
Anesthesia: LOCAL

## 2020-11-16 MED ORDER — ASPIRIN EC 81 MG PO TBEC
81.0000 mg | DELAYED_RELEASE_TABLET | Freq: Every day | ORAL | Status: DC
Start: 2020-11-17 — End: 2020-11-16

## 2020-11-16 MED ORDER — HEPARIN SODIUM (PORCINE) 1000 UNIT/ML IJ SOLN
INTRAMUSCULAR | Status: DC | PRN
  Administered 2020-11-16: 5000 [IU] via INTRAVENOUS

## 2020-11-16 MED ORDER — IOHEXOL 350 MG/ML SOLN
INTRAVENOUS | Status: DC | PRN
  Administered 2020-11-16: 60 mL

## 2020-11-16 MED ORDER — HEPARIN SODIUM (PORCINE) 1000 UNIT/ML IJ SOLN
INTRAMUSCULAR | Status: AC
  Filled 2020-11-16: qty 1

## 2020-11-16 MED ORDER — SODIUM CHLORIDE 0.9% FLUSH: 3.0000 mL | Freq: Two times a day (BID) | INTRAVENOUS | Status: DC

## 2020-11-16 MED ORDER — VERAPAMIL HCL 2.5 MG/ML IV SOLN
INTRAVENOUS | Status: AC
  Filled 2020-11-16: qty 2

## 2020-11-16 MED ORDER — BENAZEPRIL HCL 5 MG PO TABS
10.0000 mg | ORAL_TABLET | Freq: Every day | ORAL | Status: DC
  Administered 2020-11-17: 10 mg via ORAL
  Filled 2020-11-16 (×2): qty 2

## 2020-11-16 MED ORDER — HEPARIN (PORCINE) 25000 UT/250ML-% IV SOLN
1600.0000 [IU]/h | INTRAVENOUS | Status: DC
  Administered 2020-11-16: 1400 [IU]/h via INTRAVENOUS
  Filled 2020-11-16: qty 250

## 2020-11-16 MED ORDER — NITROGLYCERIN 0.4 MG SL SUBL: 0.4000 mg | SUBLINGUAL_TABLET | SUBLINGUAL | Status: DC | PRN

## 2020-11-16 MED ORDER — HEPARIN SODIUM (PORCINE) 5000 UNIT/ML IJ SOLN
5000.0000 [IU] | Freq: Three times a day (TID) | INTRAMUSCULAR | Status: DC
  Administered 2020-11-16 – 2020-11-17 (×3): 5000 [IU] via SUBCUTANEOUS
  Filled 2020-11-16 (×3): qty 1

## 2020-11-16 MED ORDER — MIDAZOLAM HCL 2 MG/2ML IJ SOLN
INTRAMUSCULAR | Status: DC | PRN
  Administered 2020-11-16: 1 mg via INTRAVENOUS

## 2020-11-16 MED ORDER — METOPROLOL SUCCINATE ER 25 MG PO TB24: 25.0000 mg | ORAL_TABLET | Freq: Every day | ORAL | Status: DC

## 2020-11-16 MED ORDER — MIDAZOLAM HCL 2 MG/2ML IJ SOLN
INTRAMUSCULAR | Status: AC
  Filled 2020-11-16: qty 2

## 2020-11-16 MED ORDER — SODIUM CHLORIDE 0.9 % IV SOLN
250.0000 mL | INTRAVENOUS | Status: DC | PRN
Start: 2020-11-16 — End: 2020-11-16

## 2020-11-16 MED ORDER — ASPIRIN 81 MG PO CHEW
81.0000 mg | CHEWABLE_TABLET | ORAL | Status: DC
Start: 2020-11-17 — End: 2020-11-16

## 2020-11-16 MED ORDER — SODIUM CHLORIDE 0.9 % WEIGHT BASED INFUSION: 1.0000 mL/kg/h | INTRAVENOUS | Status: DC

## 2020-11-16 MED ORDER — FENTANYL CITRATE (PF) 100 MCG/2ML IJ SOLN
INTRAMUSCULAR | Status: DC | PRN
  Administered 2020-11-16: 25 ug via INTRAVENOUS

## 2020-11-16 MED ORDER — HEPARIN BOLUS VIA INFUSION
4000.0000 [IU] | Freq: Once | INTRAVENOUS | Status: AC
  Administered 2020-11-16: 4000 [IU] via INTRAVENOUS

## 2020-11-16 MED ORDER — FENTANYL CITRATE (PF) 100 MCG/2ML IJ SOLN
INTRAMUSCULAR | Status: AC
  Filled 2020-11-16: qty 2

## 2020-11-16 MED ORDER — SODIUM CHLORIDE 0.9 % WEIGHT BASED INFUSION
3.0000 mL/kg/h | INTRAVENOUS | Status: DC
  Administered 2020-11-16: 3 mL/kg/h via INTRAVENOUS

## 2020-11-16 MED ORDER — NITROGLYCERIN IN D5W 200-5 MCG/ML-% IV SOLN
5.0000 ug/min | INTRAVENOUS | Status: DC
  Administered 2020-11-16: 5 ug/min via INTRAVENOUS
  Filled 2020-11-16: qty 250

## 2020-11-16 MED ORDER — HEPARIN (PORCINE) IN NACL 1000-0.9 UT/500ML-% IV SOLN
INTRAVENOUS | Status: AC
  Filled 2020-11-16: qty 1000

## 2020-11-16 MED ORDER — SODIUM CHLORIDE 0.9 % WEIGHT BASED INFUSION: 3.0000 mL/kg/h | INTRAVENOUS | Status: DC

## 2020-11-16 MED ORDER — HEPARIN BOLUS VIA INFUSION
1500.0000 [IU] | Freq: Once | INTRAVENOUS | Status: AC
  Administered 2020-11-16: 1500 [IU] via INTRAVENOUS

## 2020-11-16 MED ORDER — SODIUM CHLORIDE 0.9 % IV SOLN: INTRAVENOUS | Status: AC

## 2020-11-16 MED ORDER — SODIUM CHLORIDE 0.9% FLUSH
3.0000 mL | INTRAVENOUS | Status: DC | PRN
Start: 2020-11-16 — End: 2020-11-16

## 2020-11-16 MED ORDER — ATORVASTATIN CALCIUM 80 MG PO TABS
80.0000 mg | ORAL_TABLET | Freq: Every day | ORAL | Status: DC
  Filled 2020-11-16: qty 2
  Filled 2020-11-16: qty 1

## 2020-11-16 MED ORDER — LABETALOL HCL 5 MG/ML IV SOLN: 10.0000 mg | INTRAVENOUS | Status: AC | PRN

## 2020-11-16 MED ORDER — LIDOCAINE HCL (PF) 1 % IJ SOLN
INTRAMUSCULAR | Status: DC | PRN
  Administered 2020-11-16: 5 mL

## 2020-11-16 MED ORDER — ACETAMINOPHEN 325 MG PO TABS: 650.0000 mg | ORAL_TABLET | ORAL | Status: DC | PRN

## 2020-11-16 MED ORDER — SODIUM CHLORIDE 0.9% FLUSH
3.0000 mL | Freq: Two times a day (BID) | INTRAVENOUS | Status: DC
  Administered 2020-11-17: 3 mL via INTRAVENOUS

## 2020-11-16 MED ORDER — SODIUM CHLORIDE 0.9% FLUSH: 3.0000 mL | INTRAVENOUS | Status: DC | PRN

## 2020-11-16 MED ORDER — LIDOCAINE HCL (PF) 1 % IJ SOLN
INTRAMUSCULAR | Status: AC
  Filled 2020-11-16: qty 30

## 2020-11-16 MED ORDER — ASPIRIN 81 MG PO CHEW: 81.0000 mg | CHEWABLE_TABLET | ORAL | Status: DC

## 2020-11-16 MED ORDER — HYDRALAZINE HCL 20 MG/ML IJ SOLN: 10.0000 mg | INTRAMUSCULAR | Status: AC | PRN

## 2020-11-16 MED ORDER — ASPIRIN EC 81 MG PO TBEC: 81.0000 mg | DELAYED_RELEASE_TABLET | Freq: Every day | ORAL | Status: DC

## 2020-11-16 MED ORDER — HEPARIN (PORCINE) IN NACL 1000-0.9 UT/500ML-% IV SOLN
INTRAVENOUS | Status: DC | PRN
  Administered 2020-11-16 (×2): 500 mL

## 2020-11-16 MED ORDER — ASPIRIN 81 MG PO CHEW
81.0000 mg | CHEWABLE_TABLET | Freq: Once | ORAL | Status: AC
  Administered 2020-11-16: 81 mg via ORAL
  Filled 2020-11-16: qty 1

## 2020-11-16 MED ORDER — SODIUM CHLORIDE 0.9 % IV SOLN: 250.0000 mL | INTRAVENOUS | Status: DC | PRN

## 2020-11-16 MED ORDER — METOPROLOL SUCCINATE ER 25 MG PO TB24
25.0000 mg | ORAL_TABLET | Freq: Every day | ORAL | Status: DC
  Administered 2020-11-16 – 2020-11-17 (×2): 25 mg via ORAL
  Filled 2020-11-16 (×2): qty 1

## 2020-11-16 MED ORDER — ONDANSETRON HCL 4 MG/2ML IJ SOLN: 4.0000 mg | Freq: Four times a day (QID) | INTRAMUSCULAR | Status: DC | PRN

## 2020-11-16 MED ORDER — AMLODIPINE BESYLATE 5 MG PO TABS
5.0000 mg | ORAL_TABLET | Freq: Every day | ORAL | Status: DC
  Administered 2020-11-16 – 2020-11-17 (×2): 5 mg via ORAL
  Filled 2020-11-16 (×2): qty 1

## 2020-11-16 MED ORDER — VERAPAMIL HCL 2.5 MG/ML IV SOLN
INTRAVENOUS | Status: DC | PRN
  Administered 2020-11-16: 10 mL via INTRA_ARTERIAL

## 2020-11-16 MED ORDER — BENAZEPRIL HCL 10 MG PO TABS: 10.0000 mg | ORAL_TABLET | Freq: Every day | ORAL | Status: DC

## 2020-11-16 MED ORDER — AMLODIPINE BESYLATE 5 MG PO TABS
5.0000 mg | ORAL_TABLET | Freq: Once | ORAL | Status: AC
  Administered 2020-11-16: 5 mg via ORAL
  Filled 2020-11-16: qty 1

## 2020-11-16 MED ORDER — ASPIRIN 325 MG PO TABS: 325.0000 mg | ORAL_TABLET | Freq: Every day | ORAL | Status: DC

## 2020-11-16 SURGICAL SUPPLY — 11 items
CATH INFINITI 5FR ANG PIGTAIL (CATHETERS) ×2 IMPLANT
CATH OPTITORQUE TIG 4.0 5F (CATHETERS) ×2 IMPLANT
DEVICE RAD COMP TR BAND LRG (VASCULAR PRODUCTS) ×2 IMPLANT
GLIDESHEATH SLEND SS 6F .021 (SHEATH) ×2 IMPLANT
GUIDEWIRE INQWIRE 1.5J.035X260 (WIRE) ×1 IMPLANT
INQWIRE 1.5J .035X260CM (WIRE) ×2
KIT HEART LEFT (KITS) ×2 IMPLANT
PACK CARDIAC CATHETERIZATION (CUSTOM PROCEDURE TRAY) ×2 IMPLANT
SYR MEDRAD MARK 7 150ML (SYRINGE) ×2 IMPLANT
TRANSDUCER W/STOPCOCK (MISCELLANEOUS) ×2 IMPLANT
TUBING CIL FLEX 10 FLL-RA (TUBING) ×2 IMPLANT

## 2020-11-16 NOTE — Progress Notes (Addendum)
ANTICOAGULATION CONSULT NOTE -   Pharmacy Consult for heparin Indication: chest pain/ACS  No Known Allergies  Patient Measurements: Height: 6' (182.9 cm) Weight: 107 kg (236 lb) IBW/kg (Calculated) : 77.6 Heparin Dosing Weight: 100 kg  Vital Signs: Temp: 97.5 F (36.4 C) (11/01 0802) Temp Source: Oral (10/31 2259) BP: 113/69 (11/01 0800) Pulse Rate: 56 (11/01 0800)  Labs: Recent Labs    11/15/20 2253 11/16/20 0108 11/16/20 0932  HGB 15.0  --   --   HCT 42.7  --   --   PLT 228  --   --   HEPARINUNFRC  --   --  0.24*  CREATININE 0.97  --   --   TROPONINIHS 9 44*  --     Estimated Creatinine Clearance: 101.1 mL/min (by C-G formula based on SCr of 0.97 mg/dL).   Medical History: Past Medical History:  Diagnosis Date   Atypical mole 06/08/2017   left neck - widershave   Hyperlipemia    Hypertension    on meds for 8 years   SCC (squamous cell carcinoma) 09/06/1977   Left foream   SCC (squamous cell carcinoma) 01/31/2018   Right forearm - tx p bx   Sleep apnea    uses CPAP   Squamous cell carcinoma of skin 05/06/2015   Right forearm - CX3 + 5FU    Medications:  (Not in a hospital admission)   Assessment: Pharmacy consulted to dose heparin in patient with chest pain/NSTEMI.  Patient is not on anticoagulation prior to admission.  Plan to transfer to [facility] for cardiac cath.  HL 0.24- subtherapeutic Trop 44 CBC WNL   Goal of Therapy:  Heparin level 0.3-0.7 units/ml Monitor platelets by anticoagulation protocol: Yes   Plan:  Rebolus 1500 units IV x 1  Increase heparin infusion to 1600 units/hr Check anti-Xa level in 6 hours and daily. Continue to monitor H&H and platelets  Margot Ables, PharmD Clinical Pharmacist 11/16/2020 10:21 AM

## 2020-11-16 NOTE — H&P (Addendum)
Cardiology Admission History and Physical:   Patient ID: [name]. MRN: [id number]; DOB: [birth year]   Admission date: 11/15/2020  PCP:  Sharion Balloon, FNP   [facility] Providers Cardiologist:  Sanda Klein, MD        Chief Complaint:  Chest pain  Patient Profile:   Nicholas Moan. is a 60 y.o. male with chest pain who is being seen 11/16/2020 for the evaluation of NSTEMI.  History of Present Illness:   [name] with history of NSTEMI 2008 cath no sig CAD and ? Occlusion very small vessel, HTN, HLD, OSA, PAF 2020 CHADSVasc 1 on ASA,obesity presented last night with chest pain, positive troponin 44 and covid19 positive.  Patient says he had a cold earlier this week. Last night about 9:30 pm while taking a shower developed severe chest tightness with dyspnea, nausea and diaphoresis relieved with 4 SL NTG. He is still having very mild tightness now on IV heparin and NTG. He says it's very similar to his symptoms in 2008. Walks and does yard work and no symptoms until last night. Has refused statins in past-trig 239 LDL 78 10/2020. Nonsmoker.   Past Medical History:  Diagnosis Date   Atypical mole 06/08/2017   left neck - widershave   Hyperlipemia    Hypertension    on meds for 8 years   SCC (squamous cell carcinoma) 09/06/1977   Left foream   SCC (squamous cell carcinoma) 01/31/2018   Right forearm - tx p bx   Sleep apnea    uses CPAP   Squamous cell carcinoma of skin 05/06/2015   Right forearm - CX3 + 5FU    Past Surgical History:  Procedure Laterality Date   BACK SURGERY     CARDIOVASCULAR STRESS TEST     COLONOSCOPY  2011   in [place]-normal exam per pt   JOINT REPLACEMENT     KNEE ARTHROSCOPY     KNEE ARTHROSCOPY WITH PATELLAR TENDON REPAIR     rt knee replacement  01/16/2006   TOTAL KNEE ARTHROPLASTY  02/16/2012   Procedure: TOTAL KNEE ARTHROPLASTY;  Surgeon: Augustin Schooling, MD;  Location: [place];  Service: Orthopedics;  Laterality:  Left;     Medications Prior to Admission: Prior to Admission medications   Medication Sig Start Date End Date Taking? Authorizing Provider  amLODipine-benazepril (LOTREL) 5-10 MG capsule Take 1 capsule by mouth daily. 10/21/20   [name] A, FNP  aspirin EC 81 MG tablet Take 81 mg by mouth daily.    [provider]  metoprolol succinate (TOPROL-XL) 25 MG 24 hr tablet Take 1 tablet (25 mg total) by mouth daily. 10/21/20   [name] A, FNP  VITAMIN D PO Take by mouth.    [provider]     Allergies:   No Known Allergies  Social History:   Social History   Tobacco Use   Smoking status: Former    Packs/day: 0.50    Years: 2.00    Pack years: 1.00    Types: Cigarettes    Quit date: 01/17/1983    Years since quitting: 37.8   Smokeless tobacco: Never  Substance Use Topics   Alcohol use: No     Family History:    The patient's family history is negative for Colon cancer, Colon polyps, Esophageal cancer, Rectal cancer, and Stomach cancer.    ROS:  Please see the history of present illness.  Review of Systems  Constitutional: Negative.  HENT:  Positive for congestion.   Cardiovascular:  Positive for chest pain.  Respiratory:  Positive for cough.   Endocrine: Negative.   Hematologic/Lymphatic: Negative.   Musculoskeletal: Negative.   Gastrointestinal: Negative.   Genitourinary: Negative.   Neurological: Negative.   All other ROS reviewed and negative.     Physical Exam/Data:   Vitals:   11/16/20 0710 11/16/20 0800 11/16/20 0802 11/16/20 1026  BP: 109/66 113/69    Pulse: (!) 54 (!) 56  69  Resp: 19 17    Temp:   (!) 97.5 F (36.4 C)   TempSrc:      SpO2: 94% 93%    Weight:      Height:        Intake/Output Summary (Last 24 hours) at 11/16/2020 1027 Last data filed at 11/16/2020 6203 Gross per 24 hour  Intake 58.35 ml  Output --  Net 58.35 ml   Last 3 Weights 11/15/2020 10/27/2020 10/21/2020  Weight (lbs) 236 lb 250 lb 250 lb 9.6 oz   Weight (kg) 107.049 kg 113.399 kg 113.671 kg     Body mass index is 32.01 kg/m.  General:  Obese, in no acute distress  HEENT: normal Neck: no  JVD Vascular: No carotid bruits; Distal pulses 2+ bilaterally   Cardiac:  normal S1, S2; RRR; no murmur   Lungs:  clear to auscultation bilaterally, no wheezing, rhonchi or rales  Abd: soft, nontender, no hepatomegaly  Ext: no  edema Musculoskeletal:  No deformities, BUE and BLE strength normal and equal Skin: warm and dry  Neuro:  CNs 2-12 intact, no focal abnormalities noted Psych:  Normal affect    EKG:  The ECG that was done 11/15/20  was personally reviewed and demonstrates NSR no acute change  Relevant CV Studies: Cath 2008  Coronary angiography:  Left mainstem is angiographically normal and  bifurcates into the LAD and left circumflex.    The LAD is a large-caliber vessel that courses down and wraps around the  left ventricular apex.  It supplies a large first diagonal branch.  There are two perforators at the origin of the diagonal branch.  There  is a small second diagonal from the midportion of the LAD.  There is no  significant angiographic disease throughout the LAD or diagonal  branches.    The left circumflex is also a large-caliber vessel.  It courses down and  supplies 2 small OM branches followed by a large third OM that  bifurcates into twin vessels and supplies much of the inferolateral  wall.  The AV groove circumflex is small.  There is no significant  angiographic stenosis throughout the left circumflex.    The right coronary artery is dominant and supplies a PDA branch as well  as a small right posterolateral branch.  There is also an acute marginal  branch from the midportion of the vessel.  There is no significant  angiographic stenosis throughout the right coronary artery.    Left ventricular function is normal.  The LVEF is 60%.  There is no  mitral regurgitation.    ASSESSMENT:  1. Normal coronary  arteries.  2. Normal left ventricular function.    DISCUSSION:  I suspect noncardiac chest pain.  We will repeat a CPK in  the morning and continue IV fluids throughout the day.    Echo 09/17/18 IMPRESSIONS     1. The left ventricle has hyperdynamic systolic function, with an  ejection fraction of >65%. The cavity size was normal. Left ventricular  diastolic Doppler parameters are consistent with pseudonormalization.   2. The right ventricle has normal systolic function. The cavity was  mildly enlarged. There is no increase in right ventricular wall thickness.   3. Left atrial size was moderately dilated.   4. Right atrial size was moderately dilated.   5. No evidence of mitral valve stenosis.   6. No stenosis of the aortic valve.   7. The aorta is abnormal unless otherwise noted.   8. There is mild to moderate dilatation of the ascending aorta.   9. The atrial septum is grossly normal.  10. The average left ventricular global longitudinal strain is -21.9 %.   Laboratory Data:  High Sensitivity Troponin:   Recent Labs  Lab 11/15/20 2253 11/16/20 0108  TROPONINIHS 9 44*      Chemistry Recent Labs  Lab 11/15/20 2253  NA 137  K 3.7  CL 105  CO2 23  GLUCOSE 117*  BUN 11  CREATININE 0.97  CALCIUM 8.9  GFRNONAA >60  ANIONGAP 9    Recent Labs  Lab 11/15/20 2253  PROT 7.2  ALBUMIN 4.1  AST 31  ALT 39  ALKPHOS 68  BILITOT 0.5    Hematology Recent Labs  Lab 11/15/20 2253  WBC 9.3  RBC 4.70  HGB 15.0  HCT 42.7  MCV 90.9  MCH 31.9  MCHC 35.1  RDW 12.8  PLT 228    BNP Recent Labs  Lab 11/15/20 2253  BNP 61.0    DDimer  Recent Labs  Lab 11/15/20 2253  DDIMER 0.44    Radiology/Studies:  DG Chest Port 1 View  Result Date: 11/15/2020 CLINICAL DATA:  Chest pain, dyspnea EXAM: PORTABLE CHEST 1 VIEW COMPARISON:  02/08/2012 FINDINGS: The heart size and mediastinal contours are within normal limits. Both lungs are clear. The visualized skeletal  structures are unremarkable. IMPRESSION: No active disease. Electronically Signed   By: Fidela Salisbury M.D.   On: 11/15/2020 23:27     Assessment and Plan:   Unstable angina on IV heparin/NTG troponin 44, EKG without acute change, still with mild chest pain. History of NSTEMI 2008 with positive CPK but cath no obstructive disease? Small vessel plaque. Plan to transfer for cath-last case of day with covid19 positive I have reviewed the risks, indications, and alternatives to angioplasty and stenting with the patient. Risks include but are not limited to bleeding, infection, vascular injury, stroke, myocardial infection, arrhythmia, kidney injury, radiation-related injury in the case of prolonged fluoroscopy use, emergency cardiac surgery, and death. The patient understands the risks of serious complication is low (<6%) and patient agrees to proceed.   HTN controlled with metoprolol and amlodipine  HLD-has refused statin. LDL 78 10/2020  Obesity  PAF 2020 on ASA 81 mg for CHADSVasc of 1 but if CAD will be 2 and need anticoagulated  OSA  Covid19 positive   Risk Assessment/Risk Scores:    TIMI Risk Score for Unstable Angina or Non-ST Elevation MI:   The patient's TIMI risk score is 4, which indicates a 20% risk of all cause mortality, new or recurrent myocardial infarction or need for urgent revascularization in the next 14 days.    CHA2DS2-VASc Score = 1   This indicates a 0.6% annual risk of stroke. The patient's score is based upon: CHF History: 0 HTN History: 1 Diabetes History: 0 Stroke History: 0 Vascular Disease History: 0 Age Score: 0 Gender Score: 0      Severity of Illness: The appropriate patient status for this  patient is OBSERVATION. Observation status is judged to be reasonable and necessary in order to provide the required intensity of service to ensure the patient's safety. The patient's presenting symptoms, physical exam findings, and initial radiographic and  laboratory data in the context of their medical condition is felt to place them at decreased risk for further clinical deterioration. Furthermore, it is anticipated that the patient will be medically stable for discharge from the hospital within 2 midnights of admission.    For questions or updates, please contact [place] Please consult [email or web address] for contact info under     Signed, Ermalinda Barrios, PA-C 11/16/2020 10:27 AM     Attending note:  Patient seen under isolation for COVID-19 by Ms. Vita Barley, we discussed the case and I agree with her above findings.  [name] presents for evaluation of recent onset, reportedly severe chest tightness that occurred while taking a shower last evening.  Associated with nausea and diaphoresis and relieved ultimately with IV nitroglycerin.  He has had stuttering pain which continues this morning on IV heparin and nitroglycerin.  Case discussed with overnight cardiology fellow and patient already accepted in transfer to [facility] for a diagnostic cardiac catheterization.  He is on the schedule for later this afternoon.  History includes NSTEMI back in 2008 without obvious culprit lesion by cardiac catheterization at that time.  He has a prior history of PAF as of 2020 and has not been anticoagulated so far.  Also hypertension, hyperlipidemia (refuses statin).  He is afebrile and hemodynamically stable, in sinus rhythm by telemetry which I personally reviewed.  Pertinent lab work includes potassium 3.7, BUN 11, creatinine 0.97, BNP 61, high-sensitivity troponin I levels of 9 up to 44, hemoglobin 15.0, platelets 228, SARS coronavirus 2 test positive.  Chest x-ray reports no acute process.  I personally reviewed his ECG which shows sinus rhythm with borderline prolonged PR interval and nonspecific ST changes.  Patient presents with chest discomfort concerning for unstable angina with onset last evening and recurring pain on heparin and  nitroglycerin.  ECG without acute ST segment changes.  Also COVID-19 positive but no infiltrates by chest x-ray and no hypoxia, hemodynamically stable.  He has prior history of NSTEMI in 2008 without obvious culprit lesion.  Already accepted in transfer to [facility] in anticipation of diagnostic cardiac catheterization later this afternoon.  Satira Sark, M.D., F.A.C.C.

## 2020-11-16 NOTE — ED Notes (Signed)
[facility] arrived and at bedside.

## 2020-11-16 NOTE — ED Notes (Signed)
CRITICAL VALUE STICKER  CRITICAL VALUE: Troponin 4060  RECEIVER (on-site recipient of call): Hinton Rao, RN   [place] NOTIFIED: 11/16/2020; 1400  MESSENGER (representative from lab):  MD NOTIFIED: Dr. Domenic Polite  TIME OF NOTIFICATION: paged at 1401  RESPONSE: see chart

## 2020-11-16 NOTE — Interval H&P Note (Signed)
History and Physical Interval Note:  11/16/2020 5:22 PM  [name].  has presented today for surgery, with the diagnosis of unstable anigna / NSTEMI.  The various methods of treatment have been discussed with the patient and family. After consideration of risks, benefits and other options for treatment, the patient has consented to  Procedure(s): LEFT HEART CATH AND CORONARY ANGIOGRAPHY (N/A)  PERCUTANEOUS CORONARY INTERVENTION   as a surgical intervention.  The patient's history has been reviewed, patient examined, no change in status, stable for surgery.  I have reviewed the patient's chart and labs.  Questions were answered to the patient's satisfaction.     Glenetta Hew

## 2020-11-16 NOTE — Progress Notes (Signed)
ANTICOAGULATION CONSULT NOTE - Initial Consult  Pharmacy Consult for heparin Indication: chest pain/ACS  No Known Allergies  Patient Measurements: Height: 6' (182.9 cm) Weight: 107 kg (236 lb) IBW/kg (Calculated) : 77.6 Heparin Dosing Weight: 100kg  Vital Signs: Temp: 97.8 F (36.6 C) (10/31 2259) Temp Source: Oral (10/31 2259) BP: 136/85 (11/01 0230) Pulse Rate: 57 (11/01 0230)  Labs: Recent Labs    11/15/20 2253 11/16/20 0108  HGB 15.0  --   HCT 42.7  --   PLT 228  --   CREATININE 0.97  --   TROPONINIHS 9 44*    Estimated Creatinine Clearance: 101.1 mL/min (by C-G formula based on SCr of 0.97 mg/dL).   Medical History: Past Medical History:  Diagnosis Date   Atypical mole 06/08/2017   left neck - widershave   Hyperlipemia    Hypertension    on meds for 8 years   SCC (squamous cell carcinoma) 09/06/1977   Left foream   SCC (squamous cell carcinoma) 01/31/2018   Right forearm - tx p bx   Sleep apnea    uses CPAP   Squamous cell carcinoma of skin 05/06/2015   Right forearm - CX3 + 5FU    Assessment: 60yo male c/o CP associated with diaphoresis, SOB, and nausea, initial troponin negative but now increasing, to begin heparin.  Goal of Therapy:  Heparin level 0.3-0.7 units/ml Monitor platelets by anticoagulation protocol: Yes   Plan:  Heparin 4000 units IV bolus x1 followed by infusion at 1400 units/hr and monitor heparin levels and CBC.  Wynona Neat, PharmD, BCPS  11/16/2020,2:45 AM

## 2020-11-16 NOTE — ED Notes (Signed)
CRITICAL VALUE STICKER  CRITICAL VALUE: Troponin 4009  RECEIVER (on-site recipient of call): Hinton Rao, RN   [place] NOTIFIED: 11/16/2020, 1520  MESSENGER (representative from lab):  MD NOTIFIED: Dr. Domenic Polite aware   TIME OF NOTIFICATION:  RESPONSE: pt tx to [facility] for cardiac cath

## 2020-11-16 NOTE — ED Notes (Signed)
Report given to Azucena Kuba RN.

## 2020-11-16 NOTE — ED Notes (Signed)
Report given to [facility]. 

## 2020-11-17 ENCOUNTER — Encounter (HOSPITAL_COMMUNITY): Payer: Self-pay | Admitting: Cardiology

## 2020-11-17 ENCOUNTER — Inpatient Hospital Stay (HOSPITAL_COMMUNITY): Payer: BC Managed Care – PPO

## 2020-11-17 DIAGNOSIS — I214 Non-ST elevation (NSTEMI) myocardial infarction: Secondary | ICD-10-CM

## 2020-11-17 LAB — BASIC METABOLIC PANEL
Anion gap: 6 (ref 5–15)
BUN: 8 mg/dL (ref 8–23)
CO2: 24 mmol/L (ref 22–32)
Calcium: 8.9 mg/dL (ref 8.9–10.3)
Chloride: 107 mmol/L (ref 98–111)
Creatinine, Ser: 0.77 mg/dL (ref 0.61–1.24)
GFR, Estimated: >60 mL/min (ref >60)
Glucose, Bld: 97 mg/dL (ref 70–99)
Potassium: 3.9 mmol/L (ref 3.5–5.1)
Sodium: 137 mmol/L (ref 135–145)

## 2020-11-17 LAB — CBC
HCT: 43.6 % (ref 39.0–52.0)
Hemoglobin: 14.9 g/dL (ref 13.0–17.0)
MCH: 30.4 pg (ref 26.0–34.0)
MCHC: 34.2 g/dL (ref 30.0–36.0)
MCV: 89 fL (ref 80.0–100.0)
Platelets: 211 10*3/uL (ref 150–400)
RBC: 4.9 MIL/uL (ref 4.22–5.81)
RDW: 13 % (ref 11.5–15.5)
WBC: 11.6 10*3/uL — ABNORMAL HIGH (ref 4.0–10.5)
nRBC: 0 % (ref 0.0–0.2)

## 2020-11-17 LAB — ECHOCARDIOGRAM COMPLETE
Area-P 1/2: 4.17 cm2
Height: 72 in
S' Lateral: 3.2 cm
Weight: 3803.2 oz

## 2020-11-17 MED ORDER — ATORVASTATIN CALCIUM 40 MG PO TABS: 40.0000 mg | ORAL_TABLET | Freq: Every day | ORAL | 1 refills | Status: DC

## 2020-11-17 NOTE — Progress Notes (Signed)
  Echocardiogram 2D Echocardiogram has been performed.  Darlina Sicilian M 11/17/2020, 1:54 PM

## 2020-11-17 NOTE — Discharge Summary (Signed)
Discharge Summary    Patient ID: [name]. MRN: [id number]; DOB: 1960-11-06  Admit date: 11/15/2020 Discharge date: 11/17/2020  PCP:  Sharion Balloon, FNP   [facility] Providers Cardiologist:  Sanda Klein, MD     Discharge Diagnoses    Principal Problem:   NSTEMI (non-ST elevated myocardial infarction) [facility]) Active Problems:   Hyperlipidemia   Obesity   Paroxysmal atrial fibrillation [facility])  Diagnostic Studies/Procedures    Cath: 11/16/20    The left ventricular systolic function is normal.   LV end diastolic pressure is normal.   Angiographically normal coronary arteries with a codominant system, large L AD, LCx and moderate large caliber RI with large RCA and PDA.   SUMMARY Angiographically normal coronary arteries and normal left ventricular function with mildly elevated LVEDP. Given elevated troponin in the setting of recent COVID-19 infection, need to exclude myocarditis.   RECOMMENDATIONS Follow-up echocardiogram results.  Consider cardiac MRI to evaluate for myocarditis. Can stop IV heparin.   Glenetta Hew, MD _____________   History of Present Illness     [name]. is a 60 y.o. male with history of NSTEMI 2008 cath no sig CAD and ? Occlusion very small vessel, HTN, HLD, OSA, PAF 2020 CHADSVasc 1 on ASA,obesity who presented 10/31 with chest pain, positive troponin 44 and covid19 positive.   Patient said he had a cold earlier that week. The night prior about 9:30 pm while taking a shower developed severe chest tightness with dyspnea, nausea and diaphoresis relieved with 4 SL NTG. He was still having very mild tightness while on IV heparin and NTG. He reported this was very similar to his symptoms in 2008. Walks and does yard work and no symptoms until last night. Has refused statins in past-trig 239 LDL 78 10/2020. Nonsmoker. He was evaluated by Dr. Domenic Polite with recommendations for transfer to [facility] with plans for cath.   Hospital Course      Chest pain/NSTEMI: hsTn peaked at 4060. Underwent cardiac cath noted above with no coronary disease noted. Initially recommended for consideration of cardiac MRI but his symptoms were significantly improved the following day. Will continue with risk factor modification. Follow up echo was pending official read but preliminary read per Dr. Harriet Masson without significant abnormality.    HTN: stable -- continue amlodipine 5mg  daily, benazepril 10mg  daily and metoprolol 25mg  daily  HLD: Lipid Panel (10/21/20) LDL 78, HDL 40, Trig 239. Has refused statins in the past, but agreeable to continue on atrovastatin, 80mg  daily w was started on admission. Will reduce to 40mg  daily with no significant CAD on cath -- needs FLP/LFTs in 8 weeks   Paroxysmal Afib: maintained SR, Chads Vasc score of 1. Has not been on [place].  -- remains on ASA 81mg  daily   OSA: continue compliance with Cpap  COVID positive: had cold symptoms several days prior to admission. Asymptomatic on admission, therefore no treatment indicated.  Patient was seen by Dr. Harriet Masson and deemed stable for discharge home. Follow up in the office has been arranged. Medications sent to pharmacy of choice.   Did the patient have an acute coronary syndrome (MI, NSTEMI, STEMI, etc) this admission?:  No.   The elevated Troponin was due to the acute medical illness (demand ischemia).      _____________  Discharge Vitals Blood pressure 137/82, pulse 68, temperature 98.3 F (36.8 C), temperature source Oral, resp. rate 18, height 6' (1.829 m), weight 107.8 kg, SpO2 94 %.  Filed Weights  11/15/20 2301 11/16/20 1631 11/17/20 0640  Weight: 107 kg 109.4 kg 107.8 kg    Labs & Radiologic Studies    CBC Recent Labs    11/15/20 2253 11/16/20 1801 11/17/20 0450  WBC 9.3 14.7* 11.6*  NEUTROABS 4.6  --   --   HGB 15.0 14.2 14.9  HCT 42.7 41.3 43.6  MCV 90.9 89.2 89.0  PLT 228 208 672   Basic Metabolic Panel Recent Labs    11/15/20 2253  11/16/20 1801 11/17/20 0450  NA 137  --  137  K 3.7  --  3.9  CL 105  --  107  CO2 23  --  24  GLUCOSE 117*  --  97  BUN 11  --  8  CREATININE 0.97 0.74 0.77  CALCIUM 8.9  --  8.9   Liver Function Tests Recent Labs    11/15/20 2253  AST 31  ALT 39  ALKPHOS 68  BILITOT 0.5  PROT 7.2  ALBUMIN 4.1   No results for input(s): LIPASE, AMYLASE in the last 72 hours. High Sensitivity Troponin:   Recent Labs  Lab 11/15/20 2253 11/16/20 0108 11/16/20 1250 11/16/20 1436  TROPONINIHS 9 44* 4,060* 4,009*    BNP Invalid input(s): POCBNP D-Dimer Recent Labs    11/15/20 2253  DDIMER 0.44   Hemoglobin A1C No results for input(s): HGBA1C in the last 72 hours. Fasting Lipid Panel No results for input(s): CHOL, HDL, LDLCALC, TRIG, CHOLHDL, LDLDIRECT in the last 72 hours. Thyroid Function Tests No results for input(s): TSH, T4TOTAL, T3FREE, THYROIDAB in the last 72 hours.  Invalid input(s): FREET3 _____________  CARDIAC CATHETERIZATION  Result Date: 11/16/2020   The left ventricular systolic function is normal.   LV end diastolic pressure is normal.   Angiographically normal coronary arteries with a codominant system, large L AD, LCx and moderate large caliber RI with large RCA and PDA. SUMMARY Angiographically normal coronary arteries and normal left ventricular function with mildly elevated LVEDP. Given elevated troponin in the setting of recent COVID-19 infection, need to exclude myocarditis. RECOMMENDATIONS Follow-up echocardiogram results.  Consider cardiac MRI to evaluate for myocarditis. Can stop IV heparin. Glenetta Hew, MD   DG Chest Port 1 View  Result Date: 11/15/2020 CLINICAL DATA:  Chest pain, dyspnea EXAM: PORTABLE CHEST 1 VIEW COMPARISON:  02/08/2012 FINDINGS: The heart size and mediastinal contours are within normal limits. Both lungs are clear. The visualized skeletal structures are unremarkable. IMPRESSION: No active disease. Electronically Signed   By: Fidela Salisbury M.D.   On: 11/15/2020 23:27   Disposition   Pt is being discharged home today in good condition.  Follow-up Plans & Appointments     Follow-up Information     Deberah Pelton, NP Follow up on 12/15/2020.   Specialty: Cardiology Why: at 2:20pm for your cardiology follow up appt Contact information: [place] [address] [phone]         Sharion Balloon, [place] .   Specialty: Family Medicine Contact information: [place] [address] [phone]                Discharge Instructions     Call MD for:  difficulty breathing, headache or visual disturbances   Complete by: As directed    Call MD for:  persistant dizziness or light-headedness   Complete by: As directed    Call MD for:  redness, tenderness, or signs of infection (pain, swelling, redness, odor or  green/yellow discharge around incision site)   Complete by: As directed    Diet - low sodium heart healthy   Complete by: As directed    Discharge instructions   Complete by: As directed    Radial Site Care Refer to this sheet in the next few weeks. These instructions provide you with information on caring for yourself after your procedure. Your caregiver may also give you more specific instructions. Your treatment has been planned according to current medical practices, but problems sometimes occur. Call your caregiver if you have any problems or questions after your procedure. HOME CARE INSTRUCTIONS You may shower the day after the procedure. Remove the bandage (dressing) and gently wash the site with plain soap and water. Gently pat the site dry.  Do not apply powder or lotion to the site.  Do not submerge the affected site in water for 3 to 5 days.  Inspect the site at least twice daily.  Do not flex or bend the affected arm for 24 hours.  No lifting over 5 pounds (2.3 kg) for 5 days after your procedure.  Do not drive home if you are discharged the  same day of the procedure. Have someone else drive you.  You may drive 24 hours after the procedure unless otherwise instructed by your caregiver.  What to expect: Any bruising will usually fade within 1 to 2 weeks.  Blood that collects in the tissue (hematoma) may be painful to the touch. It should usually decrease in size and tenderness within 1 to 2 weeks.  SEEK IMMEDIATE MEDICAL CARE IF: You have unusual pain at the radial site.  You have redness, warmth, swelling, or pain at the radial site.  You have drainage (other than a small amount of blood on the dressing).  You have chills.  You have a fever or persistent symptoms for more than 72 hours.  You have a fever and your symptoms suddenly get worse.  Your arm becomes pale, cool, tingly, or numb.  You have heavy bleeding from the site. Hold pressure on the site.   Increase activity slowly   Complete by: As directed        Discharge Medications   Allergies as of 11/17/2020   No Known Allergies      Medication List     TAKE these medications    amLODipine-benazepril 5-10 MG capsule Commonly known as: LOTREL Take 1 capsule by mouth daily.   aspirin EC 81 MG tablet Take 81 mg by mouth daily.   atorvastatin 40 MG tablet Commonly known as: Lipitor Take 1 tablet (40 mg total) by mouth daily.   metoprolol succinate 25 MG 24 hr tablet Commonly known as: TOPROL-XL Take 1 tablet (25 mg total) by mouth daily.   VITAMIN D PO Take 5,000 Units by mouth daily.           Outstanding Labs/Studies   FLP/LFTs in 8 weeks  Duration of Discharge Encounter   Greater than 30 minutes including physician time.  Signed, Reino Bellis, NP 11/17/2020, 4:28 PM

## 2020-11-17 NOTE — Progress Notes (Signed)
Progress Note  Patient Name: [name]. Date of Encounter: 11/17/2020  Primary Cardiologist: Sanda Klein, MD   Subjective   Patient seen examined his bedside.  He was sitting up in bed when I arrived.  He offers no complaints at this time.  He is doing well post cath.   Inpatient Medications    Scheduled Meds:  amLODipine  5 mg Oral Daily   And   benazepril  10 mg Oral Daily   [START ON 11/18/2020] aspirin EC  81 mg Oral Daily   atorvastatin  80 mg Oral Daily   heparin  5,000 Units Subcutaneous Q8H   metoprolol succinate  25 mg Oral Daily   sodium chloride flush  3 mL Intravenous Q12H   Continuous Infusions:  sodium chloride     nitroGLYCERIN Stopped (11/16/20 2128)   PRN Meds: sodium chloride, acetaminophen, nitroGLYCERIN, ondansetron (ZOFRAN) IV, sodium chloride flush   Vital Signs    Vitals:   11/16/20 1934 11/16/20 2104 11/17/20 0640 11/17/20 0837  BP: 126/77 121/79 140/89 (!) 148/94  Pulse: 62 63 73 75  Resp:  18 18   Temp:  97.8 F (36.6 C) 98.2 F (36.8 C)   TempSrc:  Oral Oral   SpO2: 93% 97% 94%   Weight:   107.8 kg   Height:        Intake/Output Summary (Last 24 hours) at 11/17/2020 1035 Last data filed at 11/16/2020 2259 Gross per 24 hour  Intake 711.4 ml  Output 550 ml  Net 161.4 ml   Filed Weights   11/15/20 2301 11/16/20 1631 11/17/20 0640  Weight: 107 kg 109.4 kg 107.8 kg    Telemetry    Sinus rhythm, heart rate 74 bpm- Personally Reviewed  ECG    Sinus rhythm, heart rate 67 bpm with first-degree block- Personally Reviewed  Physical Exam   GEN: No acute distress.   Neck: No JVD Cardiac: RRR, no murmurs, rubs, or gallops.  Respiratory: Clear to auscultation bilaterally. GI: Soft, nontender, non-distended  MS: No edema; No deformity. Neuro:  Nonfocal  Psych: Normal affect   Labs    Chemistry Recent Labs  Lab 11/15/20 2253 11/16/20 1801 11/17/20 0450  NA 137  --  137  K 3.7  --  3.9  CL 105  --  107  CO2  23  --  24  GLUCOSE 117*  --  97  BUN 11  --  8  CREATININE 0.97 0.74 0.77  CALCIUM 8.9  --  8.9  PROT 7.2  --   --   ALBUMIN 4.1  --   --   AST 31  --   --   ALT 39  --   --   ALKPHOS 68  --   --   BILITOT 0.5  --   --   GFRNONAA >60 >60 >60  ANIONGAP 9  --  6     Hematology Recent Labs  Lab 11/15/20 2253 11/16/20 1801 11/17/20 0450  WBC 9.3 14.7* 11.6*  RBC 4.70 4.63 4.90  HGB 15.0 14.2 14.9  HCT 42.7 41.3 43.6  MCV 90.9 89.2 89.0  MCH 31.9 30.7 30.4  MCHC 35.1 34.4 34.2  RDW 12.8 13.0 13.0  PLT 228 208 211    Cardiac EnzymesNo results for input(s): TROPONINI in the last 168 hours. No results for input(s): TROPIPOC in the last 168 hours.   BNP Recent Labs  Lab 11/15/20 2253  BNP 61.0     DDimer  Recent Labs  Lab 11/15/20 2253  DDIMER 0.44     Radiology    CARDIAC CATHETERIZATION  Result Date: 11/16/2020   The left ventricular systolic function is normal.   LV end diastolic pressure is normal.   Angiographically normal coronary arteries with a codominant system, large L AD, LCx and moderate large caliber RI with large RCA and PDA. SUMMARY Angiographically normal coronary arteries and normal left ventricular function with mildly elevated LVEDP. Given elevated troponin in the setting of recent COVID-19 infection, need to exclude myocarditis. RECOMMENDATIONS Follow-up echocardiogram results.  Consider cardiac MRI to evaluate for myocarditis. Can stop IV heparin. Glenetta Hew, MD   DG Chest Port 1 View  Result Date: 11/15/2020 CLINICAL DATA:  Chest pain, dyspnea EXAM: PORTABLE CHEST 1 VIEW COMPARISON:  02/08/2012 FINDINGS: The heart size and mediastinal contours are within normal limits. Both lungs are clear. The visualized skeletal structures are unremarkable. IMPRESSION: No active disease. Electronically Signed   By: Fidela Salisbury M.D.   On: 11/15/2020 23:27    Cardiac Studies   Left heart catheterization done yesterday November 16, 2020   The left  ventricular systolic function is normal.   LV end diastolic pressure is normal.   Angiographically normal coronary arteries with a codominant system, large L AD, LCx and moderate large caliber RI with large RCA and PDA.   SUMMARY Angiographically normal coronary arteries and normal left ventricular function with mildly elevated LVEDP. Given elevated troponin in the setting of recent COVID-19 infection, need to exclude myocarditis.  Patient Profile     60 y.o. male with hypertension, hyperlipidemia, paroxysmal atrial fibrillation once transferred to [facility] for left heart catheterization.  Assessment & Plan    Recent left heart catheterization did not show any evidence of coronary artery disease.  Heparin drip has been stopped.  Echocardiogram pending to assess his LV function  Hypertension-blood pressure slightly elevated, this is isolated elevated reading therefore we will monitor and continue patient on his current regimen which includes amlodipine 5 mg daily, benazepril 10 mg daily, metoprolol succinate 25 mg daily.  Hyperlipidemia-he has refused statin in the past but right now is tolerating Lipitor 80 mg daily.  Obesity-the patient understands the need to lose weight with diet and exercise. We have discussed specific strategies for this. Paroxysmal atrial fibrillation -in sinus rhythm, continue his rate control agent.  Chads Vascor is 1 no need for any anticoagulation at this time for  OSA-continue CPAP  COVID-positive  Plan for discharge today.   For questions or updates, please contact [place] Please consult [email or web address] for contact info under Cardiology/STEMI.      Signed, Berniece Salines, DO  11/17/2020, 10:35 AM

## 2020-11-17 NOTE — Plan of Care (Signed)

## 2020-11-18 ENCOUNTER — Telehealth: Payer: Self-pay

## 2020-11-18 NOTE — Telephone Encounter (Signed)
Opened in error Needs hosp f/u, but no TCM as he has [employer]

## 2020-11-19 ENCOUNTER — Telehealth: Payer: Self-pay

## 2020-11-19 NOTE — Telephone Encounter (Signed)
Transition Care Management Follow-up Telephone Call Date of discharge and from where: 11/17/2020  [name]  Patient provided consent to speak with wife [name] How have you been since you were released from the hospital? : better, just tired" Any questions or concerns? No  Items Reviewed: Did the pt receive and understand the discharge instructions provided? Yes  Medications obtained and verified? Yes  Other? No  Any new allergies since your discharge? No  Dietary orders reviewed? Yes Do you have support at home? Yes   Home Care and Equipment/Supplies: Were home health services ordered? no If so, what is the name of the agency?  Has the agency set up a time to come to the patient's home? not applicable Were any new equipment or medical supplies ordered?  No What is the name of the medical supply agency?  Were you able to get the supplies/equipment? not applicable Do you have any questions related to the use of the equipment or supplies? No  Functional Questionnaire: (I = Independent and D = Dependent) ADLs: I  Bathing/Dressing- I  Meal Prep- I  Eating- I  Maintaining continence- I  Transferring/Ambulation- I  Managing Meds- I  Follow up appointments reviewed:  PCP Hospital f/u appt confirmed? No   Specialist Hospital f/u appt confirmed? Yes  Scheduled to see Cardiology on 12/15/20 @ 1420. Are transportation arrangements needed? No  If their condition worsens, is the pt aware to call PCP or go to the Emergency Dept.? Yes Was the patient provided with contact information for the PCP's office or ED? Yes Was to pt encouraged to call back with questions or concerns? Yes  Tomasa Rand, RN, BSN, CEN [facility] [employer] [phone]

## 2020-12-13 NOTE — Progress Notes (Signed)
Cardiology Clinic Note   Patient Name: [name]. Date of Encounter: 12/15/2020  Primary Care Provider:  Sharion Balloon, FNP Primary Cardiologist:  Sanda Klein, MD  Patient Profile    [name]. 60 year old male presents to the clinic today for follow-up evaluation status post NSTEMI.  Past Medical History    Past Medical History:  Diagnosis Date   Atypical mole 06/08/2017   left neck - widershave   Hyperlipemia    Hypertension    on meds for 8 years   SCC (squamous cell carcinoma) 09/06/1977   Left foream   SCC (squamous cell carcinoma) 01/31/2018   Right forearm - tx p bx   Sleep apnea    uses CPAP   Squamous cell carcinoma of skin 05/06/2015   Right forearm - CX3 + 5FU   Past Surgical History:  Procedure Laterality Date   BACK SURGERY     CARDIOVASCULAR STRESS TEST     COLONOSCOPY  2011   in [place]-normal exam per pt   JOINT REPLACEMENT     KNEE ARTHROSCOPY     KNEE ARTHROSCOPY WITH PATELLAR TENDON REPAIR     LEFT HEART CATH AND CORONARY ANGIOGRAPHY N/A 11/16/2020   Procedure: LEFT HEART CATH AND CORONARY ANGIOGRAPHY;  Surgeon: Leonie Man, MD;  Location: [facility];  Service: Cardiovascular;  Laterality: N/A;   rt knee replacement  01/16/2006   TOTAL KNEE ARTHROPLASTY  02/16/2012   Procedure: TOTAL KNEE ARTHROPLASTY;  Surgeon: Augustin Schooling, MD;  Location: [facility];  Service: Orthopedics;  Laterality: Left;    Allergies  No Known Allergies  History of Present Illness    [name]. has a PMH of NSTEMI 2008 with no significant coronary artery disease on cardiac catheterization.  He was noted to have a occlusion of a very small vessel, hypertension, hyperlipidemia, OSA, paroxysmal atrial fibrillation 2020 with CHA2DS2-VASc score of 1 on aspirin, and obesity.  He presented 11/15/2020 with chest discomfort and positive troponin of 44.  He was also positive for COVID-19.  He reported cold symptoms earlier that  week.  He noted that the night prior to his admission he had been taking a shower and developed severe chest tightness with dyspnea, nausea, diaphoresis which has been relieved by 4 sublingual nitroglycerin.  He continued to have mild chest tightness while on IV heparin and nitroglycerin.  He reported that he had very similar symptoms in 2008 with his previous NSTEMI.  At home he continued to walk and do yard work with no symptoms until the previous night.  He previously refused statin therapy and was noted to have triglycerides of 239, LDL 78 10/22.  He was seen and evaluated by Dr. Domenic Polite who recommended transfer to [facility] and plans for cardiac catheterization.  He underwent cardiac catheterization on 11/16/2020 which showed normal LV function, and angiographically normal coronary arteries.  Medical management was recommended.  Consideration for cardiac MRI the following day was discussed.  However his symptoms significantly improved and MRI was not completed.  His echocardiogram showed normal LVEF, mild LVH, mildly dilated left atria and no significant valvular abnormalities.  He presents to the clinic today for follow-up evaluation states he feels well.  He has had no further episodes of shortness of breath or chest discomfort.  We reviewed his hospitalization, coronary angiography, and echocardiogram.  He expressed understanding.  We also reviewed his COVID infection.  He has returned to his baseline and is back  at work doing regular hours.  We reviewed cholesterol and low-cholesterol diet.  He previously did not tolerate atorvastatin and did not start the medication.  I will have him increase/maintain his physical activity, high-fiber diet sheet, continue his current medication regimen, and plan follow-up for 6 months.  Today he denies chest pain, shortness of breath, lower extremity edema, fatigue, palpitations, weakness, presyncope, syncope, orthopnea, and PND.   Home Medications    Prior to  Admission medications   Medication Sig Start Date End Date Taking? Authorizing Provider  amLODipine-benazepril (LOTREL) 5-10 MG capsule Take 1 capsule by mouth daily. 10/21/20   [name] A, FNP  aspirin EC 81 MG tablet Take 81 mg by mouth daily.    [provider]  atorvastatin (LIPITOR) 40 MG tablet Take 1 tablet (40 mg total) by mouth daily. 11/17/20 05/16/21  Cheryln Manly, NP  metoprolol succinate (TOPROL-XL) 25 MG 24 hr tablet Take 1 tablet (25 mg total) by mouth daily. 10/21/20   [name] A, FNP  VITAMIN D PO Take 5,000 Units by mouth daily.    [provider]    Family History    Family History  Problem Relation Age of Onset   Colon cancer Neg Hx    Colon polyps Neg Hx    Esophageal cancer Neg Hx    Rectal cancer Neg Hx    Stomach cancer Neg Hx    He indicated that his mother is alive. He indicated that his father is alive. He indicated that the status of his neg hx is unknown.  Social History    Social History   Socioeconomic History   Marital status: Married    Spouse name: Not on file   Number of children: Not on file   Years of education: Not on file   Highest education level: Not on file  Occupational History   Not on file  Tobacco Use   Smoking status: Former    Packs/day: 0.50    Years: 2.00    Pack years: 1.00    Types: Cigarettes    Quit date: 01/17/1983    Years since quitting: 37.9   Smokeless tobacco: Never  Vaping Use   Vaping Use: Never used  Substance and Sexual Activity   Alcohol use: No   Drug use: No   Sexual activity: Not on file  Other Topics Concern   Not on file  Social History Narrative   Not on file   Social Determinants of Health   Financial Resource Strain: Not on file  Food Insecurity: Not on file  Transportation Needs: Not on file  Physical Activity: Not on file  Stress: Not on file  Social Connections: Not on file  Intimate Partner Violence: Not on file     Review of Systems    General:   No chills, fever, night sweats or weight changes.  Cardiovascular:  No chest pain, dyspnea on exertion, edema, orthopnea, palpitations, paroxysmal nocturnal dyspnea. Dermatological: No rash, lesions/masses Respiratory: No cough, dyspnea Urologic: No hematuria, dysuria Abdominal:   No nausea, vomiting, diarrhea, bright red blood per rectum, melena, or hematemesis Neurologic:  No visual changes, wkns, changes in mental status. All other systems reviewed and are otherwise negative except as noted above.  Physical Exam    VS:  BP 128/80   Pulse 64   Ht 6' (1.829 m)   Wt 245 lb 12.8 oz (111.5 kg)   SpO2 96%   BMI 33.34 kg/m  , BMI Body mass  index is 33.34 kg/m. GEN: Well nourished, well developed, in no acute distress. HEENT: normal. Neck: Supple, no JVD, carotid bruits, or masses. Cardiac: RRR, no murmurs, rubs, or gallops. No clubbing, cyanosis, edema.  Radials/DP/PT 2+ and equal bilaterally.  Respiratory:  Respirations regular and unlabored, clear to auscultation bilaterally. GI: Soft, nontender, nondistended, BS + x 4. MS: no deformity or atrophy. Skin: warm and dry, no rash.  Right radial cath site clean dry intact no sign of infection Neuro:  Strength and sensation are intact. Psych: Normal affect.  Accessory Clinical Findings    Recent Labs: 10/21/2020: TSH 1.560 11/15/2020: ALT 39; B Natriuretic Peptide 61.0 11/17/2020: BUN 8; Creatinine, Ser 0.77; Hemoglobin 14.9; Platelets 211; Potassium 3.9; Sodium 137   Recent Lipid Panel    Component Value Date/Time   CHOL 150 10/21/2020 1621   CHOL 172 05/31/2012 0841   TRIG 239 (H) 10/21/2020 1621   TRIG 335 (H) 04/29/2013 1635   TRIG 107 05/31/2012 0841   HDL 32 (L) 10/21/2020 1621   HDL 31 (L) 04/29/2013 1635   HDL 48 05/31/2012 0841   CHOLHDL 4.7 10/21/2020 1621   CHOLHDL 4.5 11/14/2006 0310   VLDL 13 11/14/2006 0310   LDLCALC 78 10/21/2020 1621   LDLCALC 95 04/29/2013 1635   LDLCALC 103 (H) 05/31/2012 0841    ECG  personally reviewed by me today-none today.- No acute changes  Echocardiogram 11/17/2020 IMPRESSIONS     1. Left ventricular ejection fraction, by estimation, is 60 to 65%. The  left ventricle has normal function. The left ventricle has no regional  wall motion abnormalities. There is mild left ventricular hypertrophy.  Left ventricular diastolic parameters  were normal. The average left ventricular global longitudinal strain is  -20.9 %. The global longitudinal strain is normal.   2. Right ventricular systolic function is normal. The right ventricular  size is normal. There is normal pulmonary artery systolic pressure.   3. Left atrial size was mildly dilated.   4. The mitral valve is normal in structure. No evidence of mitral valve  regurgitation.   5. The aortic valve is normal in structure. Aortic valve regurgitation is  not visualized. No aortic stenosis is present.   6. The inferior vena cava is normal in size with greater than 50%  respiratory variability, suggesting right atrial pressure of 3 mmHg.   Conclusion(s)/Recommendation(s): Compared to prior echo 09/17/2018 no  significant changes.   IMPRESSIONS     1. Left ventricular ejection fraction, by estimation, is 60 to 65%. The  left ventricle has normal function. The left ventricle has no regional  wall motion abnormalities. There is mild left ventricular hypertrophy.  Left ventricular diastolic parameters  were normal. The average left ventricular global longitudinal strain is  -20.9 %. The global longitudinal strain is normal.   2. Right ventricular systolic function is normal. The right ventricular  size is normal. There is normal pulmonary artery systolic pressure.   3. Left atrial size was mildly dilated.   4. The mitral valve is normal in structure. No evidence of mitral valve  regurgitation.   5. The aortic valve is normal in structure. Aortic valve regurgitation is  not visualized. No aortic stenosis is  present.   6. The inferior vena cava is normal in size with greater than 50%  respiratory variability, suggesting right atrial pressure of 3 mmHg.   Conclusion(s)/Recommendation(s): Compared to prior echo 09/17/2018 no  significant changes.   Cardiac catheterization 11/16/2020   The left ventricular systolic  function is normal.   LV end diastolic pressure is normal.   Angiographically normal coronary arteries with a codominant system, large L AD, LCx and moderate large caliber RI with large RCA and PDA.   SUMMARY Angiographically normal coronary arteries and normal left ventricular function with mildly elevated LVEDP. Given elevated troponin in the setting of recent COVID-19 infection, need to exclude myocarditis.   Diagnostic Dominance: Right Intervention   Assessment & Plan   1.  Chest pain/NSTEMI-no chest pain today.  Cardiac catheterization showed normal coronary angiography.  Cardiac MRI was considered however, chest pain resolved following day.  Medical management was recommended Continue amlodipine, benazepril, atorvastatin, metoprolol Heart healthy low-sodium diet-salty 6 given Increase physical activity as tolerated  Essential hypertension-BP today 128/80.  Well-controlled at home. Continue amlodipine, benazepril, metoprolol Heart healthy low-sodium diet-salty 6 given Increase physical activity as tolerated  Hyperlipidemia-10/21/2020: Cholesterol, Total 150; HDL 32; LDL Chol Calc (NIH) 78; Triglycerides 239.  Statin intolerant. Heart healthy low-sodium high-fiber diet Increase physical activity as tolerated   Paroxysmal atrial fibrillation-heart rate today 64.  No recent episodes of increased or irregular accelerated heartbeat.  CHA2DS2-VASc score 1 Continue metoprolol, aspirin Heart healthy low-sodium diet-salty 6 given Increase physical activity as tolerated  Obstructive sleep apnea-reports compliance with CPAP.  Wakes up well rested. Continue CPAP use.  COVID-19  infection-was positive for COVID-19 during recent admission.  Has recovered well and returned to baseline.  Disposition: Follow-up with Dr. Sallyanne Kuster in 6 months.  Jossie Ng. Norvell Ureste NP-C    12/15/2020, 2:37 PM [place] [address] Office [phone] Fax [phone]  Notice: This dictation was prepared with Dragon dictation along with smaller phrase technology. Any transcriptional errors that result from this process are unintentional and may not be corrected upon review.  I spent 13 minutes examining this patient, reviewing medications, and using patient centered shared decision making involving her cardiac care.  Prior to her visit I spent greater than 20 minutes reviewing her past medical history,  medications, and prior cardiac tests.

## 2020-12-15 ENCOUNTER — Encounter: Payer: Self-pay | Admitting: General Practice

## 2020-12-15 ENCOUNTER — Ambulatory Visit (INDEPENDENT_AMBULATORY_CARE_PROVIDER_SITE_OTHER): Payer: BC Managed Care – PPO | Admitting: General Practice

## 2020-12-15 ENCOUNTER — Other Ambulatory Visit: Payer: Self-pay

## 2020-12-15 VITALS — BP 128/80 | HR 64 | Ht 72.0 in | Wt 245.8 lb

## 2020-12-15 DIAGNOSIS — I214 Non-ST elevation (NSTEMI) myocardial infarction: Secondary | ICD-10-CM

## 2020-12-15 DIAGNOSIS — E782 Mixed hyperlipidemia: Secondary | ICD-10-CM

## 2020-12-15 DIAGNOSIS — I480 Paroxysmal atrial fibrillation: Secondary | ICD-10-CM

## 2020-12-15 DIAGNOSIS — I10 Essential (primary) hypertension: Secondary | ICD-10-CM | POA: Diagnosis not present

## 2020-12-15 DIAGNOSIS — G4733 Obstructive sleep apnea (adult) (pediatric): Secondary | ICD-10-CM

## 2020-12-15 DIAGNOSIS — U071 COVID-19: Secondary | ICD-10-CM

## 2020-12-15 MED ORDER — AMLODIPINE BESY-BENAZEPRIL HCL 5-10 MG PO CAPS: 1.0000 | ORAL_CAPSULE | Freq: Every day | ORAL | 4 refills | Status: DC

## 2020-12-15 NOTE — Patient Instructions (Signed)
Medication Instructions:  The current medical regimen is effective;  continue present plan and medications as directed. Please refer to the Current Medication list given to you today.   *If you need a refill on your cardiac medications before your next appointment, please call your pharmacy*  Lab Work:   Testing/Procedures:  NONE    NONE  Special Instructions PLEASE READ AND FOLLOW SALTY 6-ATTACHED-1,800mg  daily  PLEASE MAINTAIN PHYSICAL ACTIVITY AS TOLERATED   Follow-Up: Your next appointment:  6 month(s) In Person with Nicholas Klein, MD   Please call our office 2 months in advance to schedule this appointment   At [facility], you and your health needs are our priority.  As part of our continuing mission to provide you with exceptional heart care, we have created designated Provider Care Teams.  These Care Teams include your primary Cardiologist (physician) and Advanced Practice Providers (APPs -  Physician Assistants and Nurse Practitioners) who all work together to provide you with the care you need, when you need it.            6 SALTY THINGS TO AVOID     1,800MG  DAILY     High-Fiber Eating Plan Fiber, also called dietary fiber, is a type of carbohydrate. It is found foods such as fruits, vegetables, whole grains, and beans. A high-fiber diet can have many health benefits. Your health care provider may recommend a high-fiber diet to help: Prevent constipation. Fiber can make your bowel movements more regular. Lower your cholesterol. Relieve the following conditions: Inflammation of veins in the anus (hemorrhoids). Inflammation of specific areas of the digestive tract (uncomplicated diverticulosis). A problem of the large intestine, also called the colon, that sometimes causes pain and diarrhea (irritable bowel syndrome, or IBS). Prevent overeating as part of a weight-loss plan. Prevent heart disease, type 2 diabetes, and certain cancers. What are tips for following this  plan? Reading food labels  Check the nutrition facts label on food products for the amount of dietary fiber. Choose foods that have 5 grams of fiber or more per serving.  Shopping Choose whole fruits and vegetables instead of processed forms, such as apple juice or applesauce. Choose a wide variety of high-fiber foods such as avocados, lentils, oats, and kidney beans. Read the nutrition facts label of the foods you choose. Be aware of foods with added fiber. These foods often have high sugar and sodium amounts per serving. Cooking Use whole-grain flour for baking and cooking. Cook with brown rice instead of white rice. Meal planning Start the day with a breakfast that is high in fiber, such as a cereal that contains 5 g of fiber or more per serving. Eat breads and cereals that are made with whole-grain flour instead of refined flour or white flour. Eat brown rice, bulgur wheat, or millet instead of white rice. Use beans in place of meat in soups, salads, and pasta dishes. Be sure that half of the grains you eat each day are whole grains. General information You can get the recommended daily intake of dietary fiber by: Eating a variety of fruits, vegetables, grains, nuts, and beans. Taking a fiber supplement if you are not able to take in enough fiber in your diet. It is better to get fiber through food than from a supplement. Gradually increase how much fiber you consume. If you increase your intake of dietary fiber too quickly, you may have bloating, cramping, or gas. Drink plenty of water to help you digest fiber. Choose high-fiber  snacks, such as berries, raw vegetables, nuts, and popcorn. What foods should I eat? Fruits Berries. Pears. Apples. Oranges. Avocado. Prunes and raisins. Dried figs. Vegetables Sweet potatoes. Spinach. Kale. Artichokes. Cabbage. Broccoli. Cauliflower. Green peas. Carrots. Squash. Grains Whole-grain breads. Multigrain cereal. Oats and oatmeal. Brown rice.  Barley. Bulgur wheat. [place]. Quinoa. Bran muffins. Popcorn. Rye wafer crackers. Meats and other proteins Navy beans, kidney beans, and pinto beans. Soybeans. Split peas. Lentils. Nuts and seeds. Dairy Fiber-fortified yogurt. Beverages Fiber-fortified soy milk. Fiber-fortified orange juice. Other foods Fiber bars. The items listed above may not be a complete list of recommended foods and beverages. Contact a dietitian for more information. What foods should I avoid? Fruits Fruit juice. Cooked, strained fruit. Vegetables Fried potatoes. Canned vegetables. Well-cooked vegetables. Grains White bread. Pasta made with refined flour. White rice. Meats and other proteins Fatty cuts of meat. Fried chicken or fried fish. Dairy Milk. Yogurt. Cream cheese. Sour cream. Fats and oils Butters. Beverages Soft drinks. Other foods Cakes and pastries. The items listed above may not be a complete list of foods and beverages to avoid. Talk with your dietitian about what choices are best for you. Summary Fiber is a type of carbohydrate. It is found in foods such as fruits, vegetables, whole grains, and beans. A high-fiber diet has many benefits. It can help to prevent constipation, lower blood cholesterol, aid weight loss, and reduce your risk of heart disease, diabetes, and certain cancers. Increase your intake of fiber gradually. Increasing fiber too quickly may cause cramping, bloating, and gas. Drink plenty of water while you increase the amount of fiber you consume. The best sources of fiber include whole fruits and vegetables, whole grains, nuts, seeds, and beans. This information is not intended to replace advice given to you by your health care provider. Make sure you discuss any questions you have with your health care provider. Document Revised: 05/08/2019 Document Reviewed: 05/08/2019 Elsevier Patient Education  2022 [employer].

## 2021-04-20 ENCOUNTER — Ambulatory Visit: Payer: 59 | Admitting: Dermatology

## 2021-05-19 ENCOUNTER — Encounter: Payer: Self-pay | Admitting: Dermatology

## 2021-05-19 ENCOUNTER — Ambulatory Visit: Payer: BC Managed Care – PPO | Admitting: Dermatology

## 2021-05-19 DIAGNOSIS — Z1283 Encounter for screening for malignant neoplasm of skin: Secondary | ICD-10-CM

## 2021-05-19 DIAGNOSIS — Z85828 Personal history of other malignant neoplasm of skin: Secondary | ICD-10-CM

## 2021-05-19 DIAGNOSIS — L570 Actinic keratosis: Secondary | ICD-10-CM | POA: Diagnosis not present

## 2021-05-21 NOTE — Progress Notes (Signed)
HPI M smoker followed for OSA, complicated by CAD/ MI, HBP, PAFib, Hyperlipidemia, Obesity, Osteoarthritis NPSG 08/06/2018-  AHI 46.8/ hr, desaturation to 85%, CPAP titrated to 9,   ----------------------------------------------------------------------------------   05/17/20- 61 yo M former Smoker followed for OSA, complicated by CAD/ MI, HBP, PAFib, Hyperlipidemia, Obesity, Osteoarthritis CPAP auto 5-20/ [facility] Download-compliance 89%, AHI 1.5/ hr       Pressure range 6-8.5  Body weight today-240 lbs Covid vax-2 Phizer -----No current respiratory concerns.  I think he has a Respironics machine. Working very well for him. Download reviewed. He says he is used to it, and wife likes that his snoring is stopped.  No new problems or concerns.   05/23/21-  38 yo M former Smoker followed for OSA, complicated by CAD/ MI, HBP, PAFib, Hyperlipidemia, Obesity, Osteoarthritis CPAP auto 5-20/ [facility] machine Download-compliance  Body weight today 254 lbs Covid vax-2 Phizer Flu vax-had Doing well and reports consistent use with snoring controlled. Cardiology follows for rhythm.  Admits weight still a goal. CXR 1V 11/15/20-  IMPRESSION: No active disease.  ROS-see HPI   + = positive Constitutional:    weight loss, night sweats, fevers, chills, fatigue, lassitude. HEENT:    headaches, difficulty swallowing, tooth/dental problems, sore throat,       sneezing, itching, ear ache, nasal congestion, post nasal drip, snoring CV:    chest pain, orthopnea, PND, swelling in lower extremities, anasarca,                                   dizziness, palpitations Resp:   shortness of breath with exertion or at rest.                productive cough,   non-productive cough, coughing up of blood.              change in color of mucus.  wheezing.   Skin:    rash or lesions. GI:  No-   heartburn, indigestion, abdominal pain, nausea, vomiting, diarrhea,                 change in bowel habits,  loss of appetite GU: dysuria, change in color of urine, no urgency or frequency.   flank pain. MS:   joint pain, stiffness, decreased range of motion, back pain. Neuro-     nothing unusual Psych:  change in mood or affect.  depression or anxiety.   memory loss.  OBJ- Physical Exam General- Alert, Oriented, Affect-appropriate, Distress- none acute, + obese Skin- rash-none, lesions- none, excoriation- none Lymphadenopathy- none Head- atraumatic            Eyes- Gross vision intact, PERRLA, conjunctivae and secretions clear            Ears- Hearing, canals-normal            Nose- Clear, no-Septal dev, mucus, polyps, erosion, perforation             Throat- Mallampati II- III, mucosa clear , drainage- none, tonsils- atrophic Neck- flexible , trachea midline, no stridor , thyroid nl, carotid no bruit Chest - symmetrical excursion , unlabored           Heart/CV- RRR at this visit, no murmur , no gallop  , no rub, nl s1 s2                           -  JVD- none , edema- none, stasis changes- none, varices- none           Lung- clear to P&A, wheeze- none, cough- none , dullness-none, rub- none           Chest wall-  Abd-  Br/ Gen/ Rectal- Not done, not indicated Extrem- cyanosis- none, clubbing, none, atrophy- none, strength- nl Neuro- grossly intact to observation

## 2021-05-23 ENCOUNTER — Encounter: Payer: Self-pay | Admitting: Internal Medicine

## 2021-05-23 ENCOUNTER — Ambulatory Visit: Payer: BC Managed Care – PPO | Admitting: Internal Medicine

## 2021-05-23 DIAGNOSIS — G4733 Obstructive sleep apnea (adult) (pediatric): Secondary | ICD-10-CM | POA: Diagnosis not present

## 2021-05-23 DIAGNOSIS — Z6832 Body mass index (BMI) 32.0-32.9, adult: Secondary | ICD-10-CM | POA: Diagnosis not present

## 2021-05-23 DIAGNOSIS — E66811 Obesity, class 1: Secondary | ICD-10-CM

## 2021-05-23 DIAGNOSIS — E669 Obesity, unspecified: Secondary | ICD-10-CM

## 2021-05-23 NOTE — Patient Instructions (Signed)
We can continue auto 5-20 ? ?Please call if we can help ?

## 2021-06-06 ENCOUNTER — Encounter: Payer: Self-pay | Admitting: Dermatology

## 2021-06-06 NOTE — Progress Notes (Signed)
   Follow-Up Visit   Subjective  [name]. is a 61 y.o. male who presents for the following: Annual Exam (No new concerns yearly skin check, personal h/o of scc on the right and left forearm, left neck atypia ).  Normal skin examination and gentleman with history of nonmelanoma skin cancers, several sun exposed crusts Location:  Duration:  Quality:  Associated Signs/Symptoms: Modifying Factors:  Severity:  Timing: Context:   Objective  Well appearing patient in no apparent distress; mood and affect are within normal limits. No atypical nevi or signs of NMSC noted at the time of the visit. Skin tags under the arms and keratoses on the torso.  All pigmented lesions checked with dermoscopy.  Left Forearm - Posterior (3), Left Hand - Posterior (2), Right Hand - Posterior, Right Superior Helix, Right Temporal Scalp Erythematous patches with gritty scale.  Several more hornlike hypertrophic lesions.    A full examination was performed including scalp, head, eyes, ears, nose, lips, neck, chest, axillae, abdomen, back, buttocks, bilateral upper extremities, bilateral lower extremities, hands, feet, fingers, toes, fingernails, and toenails. All findings within normal limits unless otherwise noted below.   Assessment & Plan    Screening exam for skin cancer  Keep yearly skin check, encouraged to self examine twice annually.  Continued ultraviolet protection.  AK (actinic keratosis) (8) Left Hand - Posterior (2); Right Hand - Posterior; Left Forearm - Posterior (3); Right Superior Helix; Right Temporal Scalp  PDT or topical in the fall winter   Destruction of lesion - Left Forearm - Posterior, Left Hand - Posterior, Right Hand - Posterior, Right Superior Helix, Right Temporal Scalp Complexity: simple   Destruction method: cryotherapy   Informed consent: discussed and consent obtained   Timeout:  patient name, date of birth, surgical site, and procedure verified Lesion  destroyed using liquid nitrogen: Yes   Cryotherapy cycles:  3 Outcome: patient tolerated procedure well with no complications   Post-procedure details: wound care instructions given        I, Lavonna Monarch, MD, have reviewed all documentation for this visit.  The documentation on 06/06/21 for the exam, diagnosis, procedures, and orders are all accurate and complete.

## 2021-06-26 ENCOUNTER — Encounter: Payer: Self-pay | Admitting: Internal Medicine

## 2021-06-26 NOTE — Assessment & Plan Note (Signed)
Continue efforts at diet and exercise

## 2021-06-26 NOTE — Assessment & Plan Note (Signed)
Benefits from CPAP- Plan continue auto 5-20 

## 2021-10-21 ENCOUNTER — Ambulatory Visit: Payer: BC Managed Care – PPO | Admitting: Family

## 2021-10-21 ENCOUNTER — Encounter: Payer: Self-pay | Admitting: Family

## 2021-10-21 VITALS — BP 128/76 | HR 63 | Temp 98.4°F | Ht 72.0 in | Wt 241.6 lb

## 2021-10-21 DIAGNOSIS — G4733 Obstructive sleep apnea (adult) (pediatric): Secondary | ICD-10-CM | POA: Diagnosis not present

## 2021-10-21 DIAGNOSIS — E669 Obesity, unspecified: Secondary | ICD-10-CM

## 2021-10-21 DIAGNOSIS — Z0001 Encounter for general adult medical examination with abnormal findings: Secondary | ICD-10-CM

## 2021-10-21 DIAGNOSIS — Z23 Encounter for immunization: Secondary | ICD-10-CM | POA: Diagnosis not present

## 2021-10-21 DIAGNOSIS — I252 Old myocardial infarction: Secondary | ICD-10-CM | POA: Diagnosis not present

## 2021-10-21 DIAGNOSIS — E782 Mixed hyperlipidemia: Secondary | ICD-10-CM

## 2021-10-21 DIAGNOSIS — Z0000 Encounter for general adult medical examination without abnormal findings: Secondary | ICD-10-CM

## 2021-10-21 DIAGNOSIS — Z6832 Body mass index (BMI) 32.0-32.9, adult: Secondary | ICD-10-CM

## 2021-10-21 DIAGNOSIS — I10 Essential (primary) hypertension: Secondary | ICD-10-CM | POA: Diagnosis not present

## 2021-10-21 DIAGNOSIS — Z5320 Procedure and treatment not carried out because of patient's decision for unspecified reasons: Secondary | ICD-10-CM | POA: Insufficient documentation

## 2021-10-21 DIAGNOSIS — M159 Polyosteoarthritis, unspecified: Secondary | ICD-10-CM

## 2021-10-21 MED ORDER — AMLODIPINE BESY-BENAZEPRIL HCL 5-10 MG PO CAPS: 1.0000 | ORAL_CAPSULE | Freq: Every day | ORAL | 4 refills | Status: DC

## 2021-10-21 MED ORDER — METOPROLOL SUCCINATE ER 25 MG PO TB24: 25.0000 mg | ORAL_TABLET | Freq: Every day | ORAL | 4 refills | Status: DC

## 2021-10-21 NOTE — Progress Notes (Signed)
Subjective:    Patient ID: [name]., male    DOB: [birth year], 61 y.o.   MRN: [id number]  Chief Complaint  Patient presents with   Annual Exam    Flu shot today    PT presents to the office today for CPE.  Pt is followed by Cardiologists annually for hx of MI.   He has OSA and uses a CPAP nightly.  Hypertension This is a chronic problem. The current episode started more than 1 year ago. The problem has been resolved since onset. The problem is controlled. Pertinent negatives include no malaise/fatigue, peripheral edema or shortness of breath. Risk factors for coronary artery disease include dyslipidemia, obesity and male gender. The current treatment provides moderate improvement. There is no history of heart failure.  Arthritis Presents for follow-up visit. He complains of pain and stiffness. The symptoms have been stable. Affected locations include the left knee, right knee, left MCP and right MCP. His pain is at a severity of 1/10.  Hyperlipidemia This is a chronic problem. The current episode started more than 1 year ago. The problem is controlled. Recent lipid tests were reviewed and are normal. Exacerbating diseases include obesity. Pertinent negatives include no shortness of breath. Current antihyperlipidemic treatment includes statins. The current treatment provides moderate improvement of lipids. Risk factors for coronary artery disease include dyslipidemia, male sex, hypertension and a sedentary lifestyle.      Review of Systems  Constitutional:  Negative for malaise/fatigue.  Respiratory:  Negative for shortness of breath.   Musculoskeletal:  Positive for arthritis and stiffness.  All other systems reviewed and are negative.   Family History  Problem Relation Age of Onset   Colon cancer Neg Hx    Colon polyps Neg Hx    Esophageal cancer Neg Hx    Rectal cancer Neg Hx    Stomach cancer Neg Hx    Social History   Socioeconomic History   Marital status:  Married    Spouse name: Not on file   Number of children: Not on file   Years of education: Not on file   Highest education level: Not on file  Occupational History   Not on file  Tobacco Use   Smoking status: Former    Packs/day: 0.50    Years: 2.00    Total pack years: 1.00    Types: Cigarettes    Quit date: 01/17/1983    Years since quitting: 38.7   Smokeless tobacco: Never  Vaping Use   Vaping Use: Never used  Substance and Sexual Activity   Alcohol use: No   Drug use: No   Sexual activity: Not on file  Other Topics Concern   Not on file  Social History Narrative   Not on file   Social Determinants of Health   Financial Resource Strain: Not on file  Food Insecurity: Not on file  Transportation Needs: Not on file  Physical Activity: Not on file  Stress: Not on file  Social Connections: Not on file       Objective:   Physical Exam Vitals reviewed.  Constitutional:      General: He is not in acute distress.    Appearance: He is well-developed. He is obese.  HENT:     Head: Normocephalic.     Right Ear: Tympanic membrane normal.     Left Ear: Tympanic membrane normal.  Eyes:     General:        Right eye: No discharge.  Left eye: No discharge.     Pupils: Pupils are equal, round, and reactive to light.  Neck:     Thyroid: No thyromegaly.  Cardiovascular:     Rate and Rhythm: Normal rate and regular rhythm.     Heart sounds: Normal heart sounds. No murmur heard. Pulmonary:     Effort: Pulmonary effort is normal. No respiratory distress.     Breath sounds: Normal breath sounds. No wheezing.  Abdominal:     General: Bowel sounds are normal. There is no distension.     Palpations: Abdomen is soft.     Tenderness: There is no abdominal tenderness.  Musculoskeletal:        General: No tenderness. Normal range of motion.     Cervical back: Normal range of motion and neck supple.  Skin:    General: Skin is warm and dry.     Findings: No erythema or  rash.  Neurological:     Mental Status: He is alert and oriented to person, place, and time.     Cranial Nerves: No cranial nerve deficit.     Deep Tendon Reflexes: Reflexes are normal and symmetric.  Psychiatric:        Behavior: Behavior normal.        Thought Content: Thought content normal.        Judgment: Judgment normal.       BP 128/76   Pulse 63   Temp 98.4 F (36.9 C) (Temporal)   Ht 6' (1.829 m)   Wt 241 lb 9.6 oz (109.6 kg)   SpO2 93%   BMI 32.77 kg/m      Assessment & Plan:   [name]. comes in today with chief complaint of Annual Exam (Flu shot today )   Diagnosis and orders addressed:  1. Essential hypertension - amLODipine-benazepril (LOTREL) 5-10 MG capsule; Take 1 capsule by mouth daily.  Dispense: 90 capsule; Refill: 4 - metoprolol succinate (TOPROL-XL) 25 MG 24 hr tablet; Take 1 tablet (25 mg total) by mouth daily.  Dispense: 90 tablet; Refill: 4 - CMP14+EGFR - CBC with Differential/Platelet  2. Annual physical exam - CMP14+EGFR - CBC with Differential/Platelet - Lipid panel - PSA, total and free - TSH  3. H/O non-ST elevation myocardial infarction (NSTEMI) - CMP14+EGFR - CBC with Differential/Platelet  4. OSA (obstructive sleep apnea)  - CMP14+EGFR - CBC with Differential/Platelet  5. Primary osteoarthritis involving multiple joints - CMP14+EGFR - CBC with Differential/Platelet  6. Mixed hyperlipidemia - CMP14+EGFR - CBC with Differential/Platelet - Lipid panel  7. Class 1 obesity with serious comorbidity and body mass index (BMI) of 32.0 to 32.9 in adult, unspecified obesity type - CMP14+EGFR - CBC with Differential/Platelet  8. Refusal of statin medication by patient    Labs pending Health Maintenance reviewed Diet and exercise encouraged  Follow up plan: 1 year

## 2021-10-21 NOTE — Patient Instructions (Signed)
Health Maintenance, Male Adopting a healthy lifestyle and getting preventive care are important in promoting health and wellness. Ask your health care provider about: The right schedule for you to have regular tests and exams. Things you can do on your own to prevent diseases and keep yourself healthy. What should I know about diet, weight, and exercise? Eat a healthy diet  Eat a diet that includes plenty of vegetables, fruits, low-fat dairy products, and lean protein. Do not eat a lot of foods that are high in solid fats, added sugars, or sodium. Maintain a healthy weight Body mass index (BMI) is a measurement that can be used to identify possible weight problems. It estimates body fat based on height and weight. Your health care provider can help determine your BMI and help you achieve or maintain a healthy weight. Get regular exercise Get regular exercise. This is one of the most important things you can do for your health. Most adults should: Exercise for at least 150 minutes each week. The exercise should increase your heart rate and make you sweat (moderate-intensity exercise). Do strengthening exercises at least twice a week. This is in addition to the moderate-intensity exercise. Spend less time sitting. Even light physical activity can be beneficial. Watch cholesterol and blood lipids Have your blood tested for lipids and cholesterol at 62 years of age, then have this test every 5 years. You may need to have your cholesterol levels checked more often if: Your lipid or cholesterol levels are high. You are older than 62 years of age. You are at high risk for heart disease. What should I know about cancer screening? Many types of cancers can be detected early and may often be prevented. Depending on your health history and family history, you may need to have cancer screening at various ages. This may include screening for: Colorectal cancer. Prostate cancer. Skin cancer. Lung  cancer. What should I know about heart disease, diabetes, and high blood pressure? Blood pressure and heart disease High blood pressure causes heart disease and increases the risk of stroke. This is more likely to develop in people who have high blood pressure readings or are overweight. Talk with your health care provider about your target blood pressure readings. Have your blood pressure checked: Every 3-5 years if you are 18-39 years of age. Every year if you are 40 years old or older. If you are between the ages of 65 and 75 and are a current or former smoker, ask your health care provider if you should have a one-time screening for abdominal aortic aneurysm (AAA). Diabetes Have regular diabetes screenings. This checks your fasting blood sugar level. Have the screening done: Once every three years after age 45 if you are at a normal weight and have a low risk for diabetes. More often and at a younger age if you are overweight or have a high risk for diabetes. What should I know about preventing infection? Hepatitis B If you have a higher risk for hepatitis B, you should be screened for this virus. Talk with your health care provider to find out if you are at risk for hepatitis B infection. Hepatitis C Blood testing is recommended for: Everyone born from 1945 through 1965. Anyone with known risk factors for hepatitis C. Sexually transmitted infections (STIs) You should be screened each year for STIs, including gonorrhea and chlamydia, if: You are sexually active and are younger than 62 years of age. You are older than 62 years of age and your   health care provider tells you that you are at risk for this type of infection. Your sexual activity has changed since you were last screened, and you are at increased risk for chlamydia or gonorrhea. Ask your health care provider if you are at risk. Ask your health care provider about whether you are at high risk for HIV. Your health care provider  may recommend a prescription medicine to help prevent HIV infection. If you choose to take medicine to prevent HIV, you should first get tested for HIV. You should then be tested every 3 months for as long as you are taking the medicine. Follow these instructions at home: Alcohol use Do not drink alcohol if your health care provider tells you not to drink. If you drink alcohol: Limit how much you have to 0-2 drinks a day. Know how much alcohol is in your drink. In the [place] one drink equals one 12 oz bottle of beer (355 mL), one 5 oz glass of wine (148 mL), or one 1 oz glass of hard liquor (44 mL). Lifestyle Do not use any products that contain nicotine or tobacco. These products include cigarettes, chewing tobacco, and vaping devices, such as e-cigarettes. If you need help quitting, ask your health care provider. Do not use street drugs. Do not share needles. Ask your health care provider for help if you need support or information about quitting drugs. General instructions Schedule regular health, dental, and eye exams. Stay current with your vaccines. Tell your health care provider if: You often feel depressed. You have ever been abused or do not feel safe at home. Summary Adopting a healthy lifestyle and getting preventive care are important in promoting health and wellness. Follow your health care provider's instructions about healthy diet, exercising, and getting tested or screened for diseases. Follow your health care provider's instructions on monitoring your cholesterol and blood pressure. This information is not intended to replace advice given to you by your health care provider. Make sure you discuss any questions you have with your health care provider. Document Revised: 05/24/2020 Document Reviewed: 05/24/2020 Elsevier Patient Education  2023 Elsevier Inc.  

## 2021-10-22 LAB — CBC WITH DIFFERENTIAL/PLATELET
Basophils Absolute: 0 10*3/uL (ref 0.0–0.2)
Basos: 1 %
EOS (ABSOLUTE): 0.1 10*3/uL (ref 0.0–0.4)
Eos: 1 %
Hematocrit: 42.5 % (ref 37.5–51.0)
Hemoglobin: 14.7 g/dL (ref 13.0–17.7)
Immature Grans (Abs): 0 10*3/uL (ref 0.0–0.1)
Immature Granulocytes: 0 %
Lymphocytes Absolute: 1.7 10*3/uL (ref 0.7–3.1)
Lymphs: 20 %
MCH: 30.9 pg (ref 26.6–33.0)
MCHC: 34.6 g/dL (ref 31.5–35.7)
MCV: 89 fL (ref 79–97)
Monocytes Absolute: 0.8 10*3/uL (ref 0.1–0.9)
Monocytes: 9 %
Neutrophils Absolute: 5.7 10*3/uL (ref 1.4–7.0)
Neutrophils: 69 %
Platelets: 223 10*3/uL (ref 150–450)
RBC: 4.76 x10E6/uL (ref 4.14–5.80)
RDW: 12.4 % (ref 11.6–15.4)
WBC: 8.3 10*3/uL (ref 3.4–10.8)

## 2021-10-22 LAB — CMP14+EGFR
ALT: 28 IU/L (ref 0–44)
AST: 27 IU/L (ref 0–40)
Albumin/Globulin Ratio: 2.4 — ABNORMAL HIGH (ref 1.2–2.2)
Albumin: 4.6 g/dL (ref 3.9–4.9)
Alkaline Phosphatase: 83 IU/L (ref 44–121)
BUN/Creatinine Ratio: 15 (ref 10–24)
BUN: 15 mg/dL (ref 8–27)
Bilirubin Total: 0.6 mg/dL (ref 0.0–1.2)
CO2: 21 mmol/L (ref 20–29)
Calcium: 9.5 mg/dL (ref 8.6–10.2)
Chloride: 105 mmol/L (ref 96–106)
Creatinine, Ser: 1.01 mg/dL (ref 0.76–1.27)
Globulin, Total: 1.9 g/dL (ref 1.5–4.5)
Glucose: 101 mg/dL — ABNORMAL HIGH (ref 70–99)
Potassium: 4.3 mmol/L (ref 3.5–5.2)
Sodium: 143 mmol/L (ref 134–144)
Total Protein: 6.5 g/dL (ref 6.0–8.5)
eGFR: 84 mL/min/{1.73_m2} (ref >59)

## 2021-10-22 LAB — LIPID PANEL
Chol/HDL Ratio: 5.4 ratio — ABNORMAL HIGH (ref 0.0–5.0)
Cholesterol, Total: 151 mg/dL (ref 100–199)
HDL: 28 mg/dL — ABNORMAL LOW (ref >39)
LDL Chol Calc (NIH): 83 mg/dL (ref 0–99)
Triglycerides: 236 mg/dL — ABNORMAL HIGH (ref 0–149)
VLDL Cholesterol Cal: 40 mg/dL (ref 5–40)

## 2021-10-22 LAB — PSA, TOTAL AND FREE
PSA, Free Pct: 31.7 %
PSA, Free: 0.19 ng/mL
Prostate Specific Ag, Serum: 0.6 ng/mL (ref 0.0–4.0)

## 2021-10-22 LAB — TSH: TSH: 1.65 u[IU]/mL (ref 0.450–4.500)

## 2021-10-31 ENCOUNTER — Telehealth: Payer: Self-pay | Admitting: Internal Medicine

## 2021-10-31 DIAGNOSIS — G4733 Obstructive sleep apnea (adult) (pediatric): Secondary | ICD-10-CM

## 2021-11-02 NOTE — Telephone Encounter (Signed)
Order for CPAP script has been sent to [facility] for pt. Called and spoke with pt letting him know this had been done and he verbalized understanding. Nothing further needed.

## 2021-11-02 NOTE — Telephone Encounter (Signed)
Dr. Annamaria Boots, please advise if you are okay with [place] sending a new CPAP order to [facility] for pt.

## 2021-11-02 NOTE — Telephone Encounter (Signed)
Ok to order CPAP mask of choice and supplies through [employer]- thanks

## 2022-05-22 ENCOUNTER — Ambulatory Visit: Payer: BC Managed Care – PPO | Admitting: Dermatology

## 2022-05-25 ENCOUNTER — Ambulatory Visit: Payer: BC Managed Care – PPO | Admitting: Internal Medicine

## 2022-06-15 ENCOUNTER — Ambulatory Visit: Payer: BC Managed Care – PPO | Admitting: Dermatology

## 2022-06-15 VITALS — BP 154/104 | HR 63

## 2022-06-15 DIAGNOSIS — L570 Actinic keratosis: Secondary | ICD-10-CM

## 2022-06-15 DIAGNOSIS — D1801 Hemangioma of skin and subcutaneous tissue: Secondary | ICD-10-CM

## 2022-06-15 DIAGNOSIS — W908XXA Exposure to other nonionizing radiation, initial encounter: Secondary | ICD-10-CM

## 2022-06-15 DIAGNOSIS — L814 Other melanin hyperpigmentation: Secondary | ICD-10-CM | POA: Diagnosis not present

## 2022-06-15 DIAGNOSIS — L578 Other skin changes due to chronic exposure to nonionizing radiation: Secondary | ICD-10-CM

## 2022-06-15 DIAGNOSIS — D229 Melanocytic nevi, unspecified: Secondary | ICD-10-CM

## 2022-06-15 DIAGNOSIS — L821 Other seborrheic keratosis: Secondary | ICD-10-CM

## 2022-06-15 NOTE — Patient Instructions (Addendum)
Cryotherapy Aftercare  Wash gently with soap and water everyday.   Apply Vaseline and Band-Aid daily until healed.   Due to recent changes in healthcare laws, you may see results of your pathology and/or laboratory studies on MyChart before the doctors have had a chance to review them. We understand that in some cases there may be results that are confusing or concerning to you. Please understand that not all results are received at the same time and often the doctors may need to interpret multiple results in order to provide you with the best plan of care or course of treatment. Therefore, we ask that you please give us 2 business days to thoroughly review all your results before contacting the office for clarification. Should we see a critical lab result, you will be contacted sooner.   If You Need Anything After Your Visit  If you have any questions or concerns for your doctor, please call our main line at [phone] If no one answers, please leave a voicemail as directed and we will return your call as soon as possible. Messages left after 4 pm will be answered the following business day.   You may also send us a message via MyChart. We typically respond to MyChart messages within 1-2 business days.  For prescription refills, please ask your pharmacy to contact our office. Our fax number is [phone].  If you have an urgent issue when the clinic is closed that cannot wait until the next business day, you can page your doctor at the number below.    Please note that while we do our best to be available for urgent issues outside of office hours, we are not available 24/7.   If you have an urgent issue and are unable to reach us, you may choose to seek medical care at your doctor's office, retail clinic, urgent care center, or emergency room.  If you have a medical emergency, please immediately call 911 or go to the emergency department. In the event of inclement weather, please call our  main line at [phone] for an update on the status of any delays or closures.  Dermatology Medication Tips: Please keep the boxes that topical medications come in in order to help keep track of the instructions about where and how to use these. Pharmacies typically print the medication instructions only on the boxes and not directly on the medication tubes.   If your medication is too expensive, please contact our office at [phone] or send us a message through MyChart.   We are unable to tell what your co-pay for medications will be in advance as this is different depending on your insurance coverage. However, we may be able to find a substitute medication at lower cost or fill out paperwork to get insurance to cover a needed medication.   If a prior authorization is required to get your medication covered by your insurance company, please allow us 1-2 business days to complete this process.  Drug prices often vary depending on where the prescription is filled and some pharmacies may offer cheaper prices.  The website www.goodrx.com contains coupons for medications through different pharmacies. The prices here do not account for what the cost may be with help from insurance (it may be cheaper with your insurance), but the website can give you the price if you did not use any insurance.  - You can print the associated coupon and take it with your prescription to the pharmacy.  - You may also   stop by our office during regular business hours and pick up a GoodRx coupon card.  - If you need your prescription sent electronically to a different pharmacy, notify our office through [facility] MyChart or by phone at [phone]     

## 2022-06-15 NOTE — Progress Notes (Signed)
   New Patient Visit   Subjective  [name]. is a 62 y.o. male who presents for the following: Left Ear Spot Check, FBSE  Patient states he  has a spot located at the left ear that he  would like to have examined. Patient reports he is unsure as to how long the spot has been there. He  reports the area is not bothersome. He states that the areas have not spread. Patient reports he was previously seen and treated by Dr. Jorja Loa. Patient reports Hx of bx back on 10/04/20 which was negative for cancers. Patient denies family history of skin cancer(s).    The following portions of the chart were reviewed this encounter and updated as appropriate: medications, allergies, medical history  Review of Systems:  No other skin or systemic complaints except as noted in HPI or Assessment and Plan.  Objective  Well appearing patient in no apparent distress; mood and affect are within normal limits.  A full examination was performed including scalp, head, eyes, ears, nose, lips, neck, chest, axillae, abdomen, back, buttocks, bilateral upper extremities, bilateral lower extremities, hands, feet, fingers, toes, fingernails, and toenails. All findings within normal limits unless otherwise noted below.   A focused examination was performed of the following areas: Left Ear  Relevant exam findings are noted in the Assessment and Plan.       Assessment & Plan    ACTINIC KERATOSIS Exam: Erythematous thin papules/macules with gritty scale at the Left Ear  Actinic keratoses are precancerous spots that appear secondary to cumulative UV radiation exposure/sun exposure over time. They are chronic with expected duration over 1 year. A portion of actinic keratoses will progress to squamous cell carcinoma of the skin. It is not possible to reliably predict which spots will progress to skin cancer and so treatment is recommended to prevent development of skin cancer.  Recommend daily broad spectrum sunscreen  SPF 30+ to sun-exposed areas, reapply every 2 hours as needed.  Recommend staying in the shade or wearing long sleeves, sun glasses (UVA+UVB protection) and wide brim hats (4-inch brim around the entire circumference of the hat). Call for new or changing lesions.  Treatment Plan: -Cryotherapy performed today in office.  -We will plan to see him back in 6 months to F/U    LENTIGINES, SEBORRHEIC KERATOSES, HEMANGIOMAS - Benign normal skin lesions - Benign-appearing - Call for any changes  MELANOCYTIC NEVI - Tan-brown and/or pink-flesh-colored symmetric macules and papules - Benign appearing on exam today - Observation - Call clinic for new or changing moles - Recommend daily use of broad spectrum spf 30+ sunscreen to sun-exposed areas.   ACTINIC DAMAGE - Chronic condition, secondary to cumulative UV/sun exposure - diffuse scaly erythematous macules with underlying dyspigmentation - Recommend daily broad spectrum sunscreen SPF 30+ to sun-exposed areas, reapply every 2 hours as needed.  - Staying in the shade or wearing long sleeves, sun glasses (UVA+UVB protection) and wide brim hats (4-inch brim around the entire circumference of the hat) are also recommended for sun protection.  - Call for new or changing lesions.  SKIN CANCER SCREENING PERFORMED TODAY   No follow-ups on file.   Documentation: I have reviewed the above documentation for accuracy and completeness, and I agree with the above.  Stasia Cavalier, am acting as scribe for Langston Reusing, DO.  Langston Reusing, DO

## 2022-06-28 ENCOUNTER — Encounter: Payer: Self-pay | Admitting: Dermatology

## 2022-07-02 NOTE — Progress Notes (Signed)
HPI M smoker followed for OSA, complicated by CAD/ MI, HBP, PAFib, Hyperlipidemia, Obesity, Osteoarthritis NPSG 08/06/2018-  AHI 46.8/ hr, desaturation to 85%, CPAP titrated to 9,   ----------------------------------------------------------------------------------   05/23/21-  62 yo M former Smoker followed for OSA, complicated by CAD/ MI, HBP, PAFib, Hyperlipidemia, Obesity, Osteoarthritis CPAP auto 5-20/ [employer] machine   new 08/26/2018 Download-compliance  Body weight today 254 lbs Covid vax-2 Phizer Flu vax-had Doing well and reports consistent use with snoring controlled. Cardiology follows for rhythm.  Admits weight still a goal. CXR 1V 11/15/20-  IMPRESSION: No active disease.  07/03/22-  50 yo M former Smoker followed for OSA, complicated by CAD/ MI, HBP, PAFib, Hyperlipidemia, Obesity, Osteoarthritis CPAP auto 5-20/ [facility]  [place]una machine Download-compliance No data on SD card. Body weight today  [facility] is closed. He will change to [place]. We can ask for updated download capability- [employer]- for his Luna. CPAP does help.  ROS-see HPI   + = positive Constitutional:    weight loss, night sweats, fevers, chills, fatigue, lassitude. HEENT:    headaches, difficulty swallowing, tooth/dental problems, sore throat,       sneezing, itching, ear ache, nasal congestion, post nasal drip, snoring CV:    chest pain, orthopnea, PND, swelling in lower extremities, anasarca,                                   dizziness, palpitations Resp:   shortness of breath with exertion or at rest.                productive cough,   non-productive cough, coughing up of blood.              change in color of mucus.  wheezing.   Skin:    rash or lesions. GI:  No-   heartburn, indigestion, abdominal pain, nausea, vomiting, diarrhea,                 change in bowel habits, loss of appetite GU: dysuria, change in color of urine, no urgency or frequency.   flank  pain. MS:   joint pain, stiffness, decreased range of motion, back pain. Neuro-     nothing unusual Psych:  change in mood or affect.  depression or anxiety.   memory loss.  OBJ- Physical Exam General- Alert, Oriented, Affect-appropriate, Distress- none acute, + obese Skin- rash-none, lesions- none, excoriation- none Lymphadenopathy- none Head- atraumatic            Eyes- Gross vision intact, PERRLA, conjunctivae and secretions clear            Ears- Hearing, canals-normal            Nose- Clear, no-Septal dev, mucus, polyps, erosion, perforation             Throat- Mallampati II- III, mucosa clear , drainage- none, tonsils- atrophic Neck- flexible , trachea midline, no stridor , thyroid nl, carotid no bruit Chest - symmetrical excursion , unlabored           Heart/CV- RRR at this visit, no murmur , no gallop  , no rub, nl s1 s2                           - JVD- none , edema- none, stasis changes- none, varices- none  Lung- clear to P&A, wheeze- none, cough- none , dullness-none, rub- none           Chest wall-  Abd-  Br/ Gen/ Rectal- Not done, not indicated Extrem- cyanosis- none, clubbing, none, atrophy- none, strength- nl Neuro- grossly intact to observation

## 2022-07-03 ENCOUNTER — Ambulatory Visit: Payer: BC Managed Care – PPO | Admitting: Internal Medicine

## 2022-07-03 ENCOUNTER — Encounter: Payer: Self-pay | Admitting: Internal Medicine

## 2022-07-03 VITALS — BP 130/88 | HR 61 | Ht 72.0 in | Wt 228.6 lb

## 2022-07-03 DIAGNOSIS — G4733 Obstructive sleep apnea (adult) (pediatric): Secondary | ICD-10-CM

## 2022-07-03 DIAGNOSIS — I214 Non-ST elevation (NSTEMI) myocardial infarction: Secondary | ICD-10-CM | POA: Diagnosis not present

## 2022-07-03 NOTE — Patient Instructions (Addendum)
Order- [facility]- please change DME from [facility](out of business) to [facility] to continue CPAP auto 5-20, mask of choice, humidifier, supplies, AirView/ card. Machine new  08/26/2018  Please call if we can help

## 2022-08-04 ENCOUNTER — Encounter: Payer: Self-pay | Admitting: Internal Medicine

## 2022-08-04 NOTE — Assessment & Plan Note (Signed)
Benefits from CPAP Plan- Change DME to [facility] since [facility] is out of CPAP business. Continue 5-20

## 2022-08-04 NOTE — Assessment & Plan Note (Signed)
Follows with cardiology. Denies any new cardiac events or issues.

## 2022-10-23 ENCOUNTER — Encounter: Payer: Self-pay | Admitting: Family

## 2022-10-23 ENCOUNTER — Ambulatory Visit (INDEPENDENT_AMBULATORY_CARE_PROVIDER_SITE_OTHER): Payer: BC Managed Care – PPO | Admitting: Family

## 2022-10-23 VITALS — BP 125/86 | HR 67 | Temp 98.1°F | Ht 72.0 in | Wt 230.0 lb

## 2022-10-23 DIAGNOSIS — Z0000 Encounter for general adult medical examination without abnormal findings: Secondary | ICD-10-CM

## 2022-10-23 DIAGNOSIS — Z5320 Procedure and treatment not carried out because of patient's decision for unspecified reasons: Secondary | ICD-10-CM

## 2022-10-23 DIAGNOSIS — I10 Essential (primary) hypertension: Secondary | ICD-10-CM

## 2022-10-23 DIAGNOSIS — M150 Primary generalized (osteo)arthritis: Secondary | ICD-10-CM

## 2022-10-23 DIAGNOSIS — Z0001 Encounter for general adult medical examination with abnormal findings: Secondary | ICD-10-CM

## 2022-10-23 DIAGNOSIS — Z87891 Personal history of nicotine dependence: Secondary | ICD-10-CM

## 2022-10-23 DIAGNOSIS — G4733 Obstructive sleep apnea (adult) (pediatric): Secondary | ICD-10-CM

## 2022-10-23 DIAGNOSIS — E782 Mixed hyperlipidemia: Secondary | ICD-10-CM

## 2022-10-23 DIAGNOSIS — Z23 Encounter for immunization: Secondary | ICD-10-CM | POA: Diagnosis not present

## 2022-10-23 DIAGNOSIS — Z6832 Body mass index (BMI) 32.0-32.9, adult: Secondary | ICD-10-CM

## 2022-10-23 DIAGNOSIS — I252 Old myocardial infarction: Secondary | ICD-10-CM | POA: Diagnosis not present

## 2022-10-23 DIAGNOSIS — E66811 Obesity, class 1: Secondary | ICD-10-CM

## 2022-10-23 MED ORDER — AMLODIPINE BESY-BENAZEPRIL HCL 5-10 MG PO CAPS: 1.0000 | ORAL_CAPSULE | Freq: Every day | ORAL | 4 refills | Status: DC

## 2022-10-23 MED ORDER — METOPROLOL SUCCINATE ER 25 MG PO TB24: 25.0000 mg | ORAL_TABLET | Freq: Every day | ORAL | 4 refills | Status: DC

## 2022-10-23 NOTE — Patient Instructions (Signed)
Health Maintenance, Male Adopting a healthy lifestyle and getting preventive care are important in promoting health and wellness. Ask your health care provider about: The right schedule for you to have regular tests and exams. Things you can do on your own to prevent diseases and keep yourself healthy. What should I know about diet, weight, and exercise? Eat a healthy diet  Eat a diet that includes plenty of vegetables, fruits, low-fat dairy products, and lean protein. Do not eat a lot of foods that are high in solid fats, added sugars, or sodium. Maintain a healthy weight Body mass index (BMI) is a measurement that can be used to identify possible weight problems. It estimates body fat based on height and weight. Your health care provider can help determine your BMI and help you achieve or maintain a healthy weight. Get regular exercise Get regular exercise. This is one of the most important things you can do for your health. Most adults should: Exercise for at least 150 minutes each week. The exercise should increase your heart rate and make you sweat (moderate-intensity exercise). Do strengthening exercises at least twice a week. This is in addition to the moderate-intensity exercise. Spend less time sitting. Even light physical activity can be beneficial. Watch cholesterol and blood lipids Have your blood tested for lipids and cholesterol at 63 years of age, then have this test every 5 years. You may need to have your cholesterol levels checked more often if: Your lipid or cholesterol levels are high. You are older than 63 years of age. You are at high risk for heart disease. What should I know about cancer screening? Many types of cancers can be detected early and may often be prevented. Depending on your health history and family history, you may need to have cancer screening at various ages. This may include screening for: Colorectal cancer. Prostate cancer. Skin cancer. Lung  cancer. What should I know about heart disease, diabetes, and high blood pressure? Blood pressure and heart disease High blood pressure causes heart disease and increases the risk of stroke. This is more likely to develop in people who have high blood pressure readings or are overweight. Talk with your health care provider about your target blood pressure readings. Have your blood pressure checked: Every 3-5 years if you are 18-39 years of age. Every year if you are 40 years old or older. If you are between the ages of 65 and 75 and are a current or former smoker, ask your health care provider if you should have a one-time screening for abdominal aortic aneurysm (AAA). Diabetes Have regular diabetes screenings. This checks your fasting blood sugar level. Have the screening done: Once every three years after age 45 if you are at a normal weight and have a low risk for diabetes. More often and at a younger age if you are overweight or have a high risk for diabetes. What should I know about preventing infection? Hepatitis B If you have a higher risk for hepatitis B, you should be screened for this virus. Talk with your health care provider to find out if you are at risk for hepatitis B infection. Hepatitis C Blood testing is recommended for: Everyone born from 1945 through 1965. Anyone with known risk factors for hepatitis C. Sexually transmitted infections (STIs) You should be screened each year for STIs, including gonorrhea and chlamydia, if: You are sexually active and are younger than 63 years of age. You are older than 63 years of age and your   health care provider tells you that you are at risk for this type of infection. Your sexual activity has changed since you were last screened, and you are at increased risk for chlamydia or gonorrhea. Ask your health care provider if you are at risk. Ask your health care provider about whether you are at high risk for HIV. Your health care provider  may recommend a prescription medicine to help prevent HIV infection. If you choose to take medicine to prevent HIV, you should first get tested for HIV. You should then be tested every 3 months for as long as you are taking the medicine. Follow these instructions at home: Alcohol use Do not drink alcohol if your health care provider tells you not to drink. If you drink alcohol: Limit how much you have to 0-2 drinks a day. Know how much alcohol is in your drink. In the [place] one drink equals one 12 oz bottle of beer (355 mL), one 5 oz glass of wine (148 mL), or one 1 oz glass of hard liquor (44 mL). Lifestyle Do not use any products that contain nicotine or tobacco. These products include cigarettes, chewing tobacco, and vaping devices, such as e-cigarettes. If you need help quitting, ask your health care provider. Do not use street drugs. Do not share needles. Ask your health care provider for help if you need support or information about quitting drugs. General instructions Schedule regular health, dental, and eye exams. Stay current with your vaccines. Tell your health care provider if: You often feel depressed. You have ever been abused or do not feel safe at home. Summary Adopting a healthy lifestyle and getting preventive care are important in promoting health and wellness. Follow your health care provider's instructions about healthy diet, exercising, and getting tested or screened for diseases. Follow your health care provider's instructions on monitoring your cholesterol and blood pressure. This information is not intended to replace advice given to you by your health care provider. Make sure you discuss any questions you have with your health care provider. Document Revised: 05/24/2020 Document Reviewed: 05/24/2020 Elsevier Patient Education  2024 Elsevier Inc.  

## 2022-10-23 NOTE — Progress Notes (Addendum)
Subjective:    Patient ID: [name]., male    DOB: Aug 23, 1960, 62 y.o.   MRN: [id number]  Chief Complaint  Patient presents with   Annual Exam    Fasting no concerns     PT presents to the office today for CPE.  Pt has hx of MI, has not seen Cardiologists in awhile.    He has OSA and uses a CPAP nightly.  Hypertension This is a chronic problem. The current episode started more than 1 year ago. The problem has been resolved since onset. The problem is controlled. Pertinent negatives include no malaise/fatigue, peripheral edema or shortness of breath. Risk factors for coronary artery disease include dyslipidemia and sedentary lifestyle. The current treatment provides moderate improvement.  Arthritis Presents for follow-up visit. He complains of pain and stiffness. Affected locations include the right knee, left knee, left MCP and right MCP. His pain is at a severity of 0/10 (today).  Hyperlipidemia This is a chronic problem. The current episode started more than 1 year ago. The problem is uncontrolled. Exacerbating diseases include obesity. Pertinent negatives include no shortness of breath. Current antihyperlipidemic treatment includes diet change. The current treatment provides mild improvement of lipids. Risk factors for coronary artery disease include dyslipidemia, hypertension and a sedentary lifestyle.      Review of Systems  Constitutional:  Negative for malaise/fatigue.  Respiratory:  Negative for shortness of breath.   Musculoskeletal:  Positive for arthritis and stiffness.  All other systems reviewed and are negative.  Family History  Problem Relation Age of Onset   Colon cancer Neg Hx    Colon polyps Neg Hx    Esophageal cancer Neg Hx    Rectal cancer Neg Hx    Stomach cancer Neg Hx    Social History   Socioeconomic History   Marital status: Married    Spouse name: Not on file   Number of children: Not on file   Years of education: Not on file   Highest  education level: Not on file  Occupational History   Not on file  Tobacco Use   Smoking status: Former    Current packs/day: 0.00    Average packs/day: 0.5 packs/day for 2.0 years (1.0 ttl pk-yrs)    Types: Cigarettes    Start date: 01/16/1981    Quit date: 01/17/1983    Years since quitting: 39.7   Smokeless tobacco: Never  Vaping Use   Vaping status: Never Used  Substance and Sexual Activity   Alcohol use: No   Drug use: No   Sexual activity: Not on file  Other Topics Concern   Not on file  Social History Narrative   Not on file   Social Determinants of Health   Financial Resource Strain: Not on file  Food Insecurity: Not on file  Transportation Needs: Not on file  Physical Activity: Not on file  Stress: Not on file  Social Connections: Not on file        Objective:   Physical Exam Vitals reviewed.  Constitutional:      General: He is not in acute distress.    Appearance: He is well-developed.  HENT:     Head: Normocephalic.     Right Ear: Tympanic membrane normal.     Left Ear: Tympanic membrane normal.  Eyes:     General:        Right eye: No discharge.        Left eye: No discharge.  Pupils: Pupils are equal, round, and reactive to light.  Neck:     Thyroid: No thyromegaly.  Cardiovascular:     Rate and Rhythm: Normal rate and regular rhythm.     Heart sounds: Normal heart sounds. No murmur heard. Pulmonary:     Effort: Pulmonary effort is normal. No respiratory distress.     Breath sounds: Normal breath sounds. No wheezing.  Abdominal:     General: Bowel sounds are normal. There is no distension.     Palpations: Abdomen is soft.     Tenderness: There is no abdominal tenderness.  Musculoskeletal:        General: No tenderness. Normal range of motion.     Cervical back: Normal range of motion and neck supple.  Skin:    General: Skin is warm and dry.     Findings: No erythema or rash.  Neurological:     Mental Status: He is alert and oriented to  person, place, and time.     Cranial Nerves: No cranial nerve deficit.     Deep Tendon Reflexes: Reflexes are normal and symmetric.  Psychiatric:        Behavior: Behavior normal.        Thought Content: Thought content normal.        Judgment: Judgment normal.       BP 125/86   Pulse 67   Temp 98.1 F (36.7 C) (Temporal)   Ht 6' (1.829 m)   Wt 230 lb (104.3 kg)   SpO2 95%   BMI 31.19 kg/m   Assessment & Plan:  [name]. comes in today with chief complaint of Annual Exam (Fasting no concerns )   Diagnosis and orders addressed:  1. Essential hypertension - amLODipine-benazepril (LOTREL) 5-10 MG capsule; Take 1 capsule by mouth daily.  Dispense: 90 capsule; Refill: 4 - metoprolol succinate (TOPROL-XL) 25 MG 24 hr tablet; Take 1 tablet (25 mg total) by mouth daily.  Dispense: 90 tablet; Refill: 4 - CMP14+EGFR  2. Encounter for immunization - Flu vaccine trivalent PF, 6mos and older(Flulaval,Afluria,Fluarix,Fluzone) - CMP14+EGFR  3. Annual physical exam - CBC with Differential/Platelet - CMP14+EGFR - Lipid panel - PSA, total and free - TSH  4. H/O non-ST elevation myocardial infarction (NSTEMI) - CMP14+EGFR  5. Hx of smoking - CMP14+EGFR  6. Mixed hyperlipidemia - CMP14+EGFR  7. Class 1 obesity with serious comorbidity and body mass index (BMI) of 32.0 to 32.9 in adult, unspecified obesity type - CMP14+EGFR  8. OSA (obstructive sleep apnea) - CMP14+EGFR  9. Primary osteoarthritis involving multiple joints - CMP14+EGFR  10. Refusal of statin medication by patient - CMP14+EGFR   Labs pending Continue current medications Encouraged to make Cardiologists  Health Maintenance reviewed Diet and exercise encouraged  Follow up plan: 1 year    Jannifer Rodney, FNP

## 2022-10-24 ENCOUNTER — Other Ambulatory Visit: Payer: Self-pay | Admitting: Family

## 2022-10-24 LAB — CBC WITH DIFFERENTIAL/PLATELET
Basophils Absolute: 0.1 10*3/uL (ref 0.0–0.2)
Basos: 1 %
EOS (ABSOLUTE): 0.1 10*3/uL (ref 0.0–0.4)
Eos: 2 %
Hematocrit: 49.9 % (ref 37.5–51.0)
Hemoglobin: 16.4 g/dL (ref 13.0–17.7)
Immature Grans (Abs): 0 10*3/uL (ref 0.0–0.1)
Immature Granulocytes: 0 %
Lymphocytes Absolute: 2 10*3/uL (ref 0.7–3.1)
Lymphs: 27 %
MCH: 30.2 pg (ref 26.6–33.0)
MCHC: 32.9 g/dL (ref 31.5–35.7)
MCV: 92 fL (ref 79–97)
Monocytes Absolute: 0.8 10*3/uL (ref 0.1–0.9)
Monocytes: 11 %
Neutrophils Absolute: 4.6 10*3/uL (ref 1.4–7.0)
Neutrophils: 59 %
Platelets: 221 10*3/uL (ref 150–450)
RBC: 5.43 x10E6/uL (ref 4.14–5.80)
RDW: 12.6 % (ref 11.6–15.4)
WBC: 7.6 10*3/uL (ref 3.4–10.8)

## 2022-10-24 LAB — CMP14+EGFR
ALT: 17 [IU]/L (ref 0–44)
AST: 23 [IU]/L (ref 0–40)
Albumin: 4.6 g/dL (ref 3.9–4.9)
Alkaline Phosphatase: 86 [IU]/L (ref 44–121)
BUN/Creatinine Ratio: 14 (ref 10–24)
BUN: 13 mg/dL (ref 8–27)
Bilirubin Total: 1.1 mg/dL (ref 0.0–1.2)
CO2: 22 mmol/L (ref 20–29)
Calcium: 9.1 mg/dL (ref 8.6–10.2)
Chloride: 104 mmol/L (ref 96–106)
Creatinine, Ser: 0.94 mg/dL (ref 0.76–1.27)
Globulin, Total: 2.3 g/dL (ref 1.5–4.5)
Glucose: 97 mg/dL (ref 70–99)
Potassium: 4.7 mmol/L (ref 3.5–5.2)
Sodium: 140 mmol/L (ref 134–144)
Total Protein: 6.9 g/dL (ref 6.0–8.5)
eGFR: 91 mL/min/{1.73_m2} (ref >59)

## 2022-10-24 LAB — LIPID PANEL
Chol/HDL Ratio: 4.6 {ratio} (ref 0.0–5.0)
Cholesterol, Total: 151 mg/dL (ref 100–199)
HDL: 33 mg/dL — ABNORMAL LOW (ref >39)
LDL Chol Calc (NIH): 96 mg/dL (ref 0–99)
Triglycerides: 121 mg/dL (ref 0–149)
VLDL Cholesterol Cal: 22 mg/dL (ref 5–40)

## 2022-10-24 LAB — PSA, TOTAL AND FREE
PSA, Free Pct: 25 %
PSA, Free: 0.25 ng/mL
Prostate Specific Ag, Serum: 1 ng/mL (ref 0.0–4.0)

## 2022-10-24 LAB — TSH: TSH: 3.08 u[IU]/mL (ref 0.450–4.500)

## 2022-10-24 MED ORDER — ROSUVASTATIN CALCIUM 10 MG PO TABS: 10.0000 mg | ORAL_TABLET | Freq: Every day | ORAL | 3 refills | Status: DC

## 2022-11-23 ENCOUNTER — Other Ambulatory Visit (HOSPITAL_COMMUNITY): Payer: Self-pay | Admitting: Orthopedic Surgery

## 2022-11-23 DIAGNOSIS — Z96652 Presence of left artificial knee joint: Secondary | ICD-10-CM

## 2022-11-30 ENCOUNTER — Encounter (HOSPITAL_COMMUNITY)
Admission: RE | Admit: 2022-11-30 | Discharge: 2022-11-30 | Disposition: A | Discharge status: Home / Self Care | Payer: BC Managed Care – PPO | Source: Ambulatory Visit | Attending: Orthopedic Surgery | Admitting: Orthopedic Surgery

## 2022-11-30 DIAGNOSIS — Z96652 Presence of left artificial knee joint: Secondary | ICD-10-CM

## 2022-11-30 MED ORDER — TECHNETIUM TC 99M MEDRONATE IV KIT
20.0000 | PACK | Freq: Once | INTRAVENOUS | Status: AC | PRN
  Administered 2022-11-30: 21.7 via INTRAVENOUS

## 2022-12-18 ENCOUNTER — Ambulatory Visit: Payer: BC Managed Care – PPO | Admitting: Dermatology

## 2023-07-03 ENCOUNTER — Ambulatory Visit: Payer: BC Managed Care – PPO | Admitting: Internal Medicine

## 2023-07-04 ENCOUNTER — Encounter (HOSPITAL_BASED_OUTPATIENT_CLINIC_OR_DEPARTMENT_OTHER): Payer: Self-pay

## 2023-08-27 ENCOUNTER — Ambulatory Visit: Admitting: Internal Medicine

## 2023-09-03 NOTE — Progress Notes (Unsigned)
 HPI M smoker followed for OSA, complicated by CAD/ MI, HBP, PAFib, Hyperlipidemia, Obesity, Osteoarthritis NPSG 08/06/2018-  AHI 46.8/ hr, desaturation to 85%, CPAP titrated to 9,   ----------------------------------------------------------------------------------  07/03/22-  63 yo M former Smoker followed for OSA, complicated by CAD/ MI, HBP, PAFib, Hyperlipidemia, Obesity, Osteoarthritis CPAP auto 5-20/ [facility]  Luna machine Download-compliance No data on SD card. Body weight today  [facility] is [facility]. He will change to [place]. We can ask for updated download capability- [employer]- for his Luna. CPAP does help.  09/04/23- 54 yo M former Smoker followed for OSA, complicated by CAD/ MI, HBP, PAFib, Hyperlipidemia, Obesity, Osteoarthritis CPAP auto 5-20/ [facility]  ?Luna machine   replaced 08/26/23  Download-compliance  Body weight today   ROS-see HPI   + = positive Constitutional:    weight loss, night sweats, fevers, chills, fatigue, lassitude. HEENT:    headaches, difficulty swallowing, tooth/dental problems, sore throat,       sneezing, itching, ear ache, nasal congestion, post nasal drip, snoring CV:    chest pain, orthopnea, PND, swelling in lower extremities, anasarca,                                   dizziness, palpitations Resp:   shortness of breath with exertion or at rest.                productive cough,   non-productive cough, coughing up of blood.              change in color of mucus.  wheezing.   Skin:    rash or lesions. GI:  No-   heartburn, indigestion, abdominal pain, nausea, vomiting, diarrhea,                 change in bowel habits, loss of appetite GU: dysuria, change in color of urine, no urgency or frequency.   flank pain. MS:   joint pain, stiffness, decreased range of motion, back pain. Neuro-     nothing unusual Psych:  change in mood or affect.  depression or anxiety.   memory loss.  OBJ- Physical Exam General-  Alert, Oriented, Affect-appropriate, Distress- none acute, + obese Skin- rash-none, lesions- none, excoriation- none Lymphadenopathy- none Head- atraumatic            Eyes- Gross vision intact, PERRLA, conjunctivae and secretions clear            Ears- Hearing, canals-normal            Nose- Clear, no-Septal dev, mucus, polyps, erosion, perforation             Throat- Mallampati II- III, mucosa clear , drainage- none, tonsils- atrophic Neck- flexible , trachea midline, no stridor , thyroid  nl, carotid no bruit Chest - symmetrical excursion , unlabored           Heart/CV- RRR at this visit, no murmur , no gallop  , no rub, nl s1 s2                           - JVD- none , edema- none, stasis changes- none, varices- none           Lung- clear to P&A, wheeze- none, cough- none , dullness-none, rub- none           Chest wall-  Abd-  Br/ Gen/ Rectal- Not  done, not indicated Extrem- cyanosis- none, clubbing, none, atrophy- none, strength- nl Neuro- grossly intact to observation

## 2023-09-04 ENCOUNTER — Encounter: Payer: Self-pay | Admitting: Internal Medicine

## 2023-09-04 ENCOUNTER — Ambulatory Visit: Admitting: Internal Medicine

## 2023-09-04 VITALS — BP 135/92 | HR 63 | Temp 97.7°F | Ht 72.0 in | Wt 235.6 lb

## 2023-09-04 DIAGNOSIS — G4733 Obstructive sleep apnea (adult) (pediatric): Secondary | ICD-10-CM

## 2023-09-04 DIAGNOSIS — Z87891 Personal history of nicotine dependence: Secondary | ICD-10-CM | POA: Diagnosis not present

## 2023-09-04 NOTE — Patient Instructions (Signed)
 Order- DME [facility]- please replace old CPAP machine, auto 5-20, mask of choice, humidifier, supplies, AirView/ card  Please let us  know if we can help

## 2023-09-05 ENCOUNTER — Encounter: Payer: Self-pay | Admitting: Internal Medicine

## 2023-09-19 ENCOUNTER — Encounter: Payer: Self-pay | Admitting: *Deleted

## 2023-10-27 ENCOUNTER — Other Ambulatory Visit: Payer: Self-pay | Admitting: Family

## 2023-10-27 DIAGNOSIS — I10 Essential (primary) hypertension: Secondary | ICD-10-CM

## 2023-10-29 ENCOUNTER — Encounter: Payer: BC Managed Care – PPO | Admitting: Family

## 2023-10-29 ENCOUNTER — Other Ambulatory Visit: Payer: Self-pay | Admitting: Family

## 2023-10-29 DIAGNOSIS — I10 Essential (primary) hypertension: Secondary | ICD-10-CM

## 2023-10-29 NOTE — Telephone Encounter (Unsigned)
 Copied from CRM [id number]. Topic: Clinical - Medication Refill >> Oct 29, 2023  9:09 AM Antwanette L wrote: Medication: metoprolol  succinate (TOPROL -XL) 25 MG 24 hr tablet and amLODipine -benazepril  (LOTREL) 5-10 MG capsule [  Has the patient contacted their pharmacy? Yes   This is the patient's preferred pharmacy:  [facility] - [place], [place] - [address] [place] [address] Phone: [phone] Fax: [phone]  Is this the correct pharmacy for this prescription? Yes   Has the prescription been filled recently? No. Last refill was on 10/23/22  Is the patient out of the medication? Yes  Has the patient been seen for an appointment in the last year OR does the patient have an upcoming appointment?No. Last ov w/ Bari Learn was on 10/23/22. Next appt is on 11/09/23.  Can we respond through MyChart? No. Contact the pt by phone at [phone]  Agent: Please be advised that Rx refills may take up to 3 business days. We ask that you follow-up with your pharmacy.

## 2023-11-08 ENCOUNTER — Telehealth: Payer: Self-pay

## 2023-11-08 DIAGNOSIS — G4733 Obstructive sleep apnea (adult) (pediatric): Secondary | ICD-10-CM

## 2023-11-08 NOTE — Telephone Encounter (Signed)
 Copied from CRM [id number]. Topic: Clinical - Order For Equipment >> Nov 07, 2023  4:01 PM Rozanna MATSU wrote: Reason for CRM: pt called stated his insurance is no longer in network with [employer] is no longer in network with insurance. His machine is not working anymore CPAP machine. Pt not sure which one so they decided to go with [facility]. >> Nov 08, 2023 10:04 AM Zebedee NOVAK wrote: Pcc's will need an order to send to a new company I don't see where we have sent any orders for them  Patient is aware.NFN

## 2023-11-09 ENCOUNTER — Encounter: Payer: Self-pay | Admitting: Family

## 2023-11-09 ENCOUNTER — Ambulatory Visit

## 2023-11-09 VITALS — BP 138/75 | HR 65 | Temp 97.6°F | Ht 72.0 in | Wt 235.8 lb

## 2023-11-09 DIAGNOSIS — Z0001 Encounter for general adult medical examination with abnormal findings: Secondary | ICD-10-CM | POA: Diagnosis not present

## 2023-11-09 DIAGNOSIS — Z23 Encounter for immunization: Secondary | ICD-10-CM | POA: Diagnosis not present

## 2023-11-09 DIAGNOSIS — M150 Primary generalized (osteo)arthritis: Secondary | ICD-10-CM

## 2023-11-09 DIAGNOSIS — E782 Mixed hyperlipidemia: Secondary | ICD-10-CM | POA: Diagnosis not present

## 2023-11-09 DIAGNOSIS — E66811 Obesity, class 1: Secondary | ICD-10-CM

## 2023-11-09 DIAGNOSIS — I10 Essential (primary) hypertension: Secondary | ICD-10-CM

## 2023-11-09 DIAGNOSIS — I480 Paroxysmal atrial fibrillation: Secondary | ICD-10-CM

## 2023-11-09 DIAGNOSIS — Z0000 Encounter for general adult medical examination without abnormal findings: Secondary | ICD-10-CM

## 2023-11-09 DIAGNOSIS — G4733 Obstructive sleep apnea (adult) (pediatric): Secondary | ICD-10-CM

## 2023-11-09 DIAGNOSIS — Z5320 Procedure and treatment not carried out because of patient's decision for unspecified reasons: Secondary | ICD-10-CM | POA: Diagnosis not present

## 2023-11-09 DIAGNOSIS — I252 Old myocardial infarction: Secondary | ICD-10-CM

## 2023-11-09 DIAGNOSIS — Z6831 Body mass index (BMI) 31.0-31.9, adult: Secondary | ICD-10-CM

## 2023-11-09 LAB — LIPID PANEL

## 2023-11-09 MED ORDER — AMLODIPINE BESY-BENAZEPRIL HCL 5-10 MG PO CAPS: 1.0000 | ORAL_CAPSULE | Freq: Every day | ORAL | 4 refills | Status: AC

## 2023-11-09 MED ORDER — METOPROLOL SUCCINATE ER 25 MG PO TB24: 25.0000 mg | ORAL_TABLET | Freq: Every day | ORAL | 4 refills | Status: AC

## 2023-11-09 NOTE — Progress Notes (Signed)
 Subjective:    Patient ID: [name]., male    DOB: 04-29-1960, 63 y.o.   MRN: [id number]  Chief Complaint  Patient presents with   Annual Exam    PT presents to the office today for CPE.  Pt has hx of MI, has not seen Cardiologists in awhile.    He has OSA and using a CPAP nightly, but in the process of getting a new one.  Hypertension This is a chronic problem. The current episode started more than 1 year ago. The problem has been resolved since onset. The problem is controlled. Pertinent negatives include no malaise/fatigue, peripheral edema or shortness of breath. Risk factors for coronary artery disease include dyslipidemia, sedentary lifestyle and male gender. The current treatment provides moderate improvement.  Arthritis Presents for follow-up visit. He complains of pain and stiffness. Affected locations include the right knee, left knee, left MCP and right MCP. His pain is at a severity of 1/10.  Hyperlipidemia This is a chronic problem. The current episode started more than 1 year ago. The problem is controlled. Recent lipid tests were reviewed and are normal. Exacerbating diseases include obesity. Pertinent negatives include no shortness of breath. Current antihyperlipidemic treatment includes diet change. The current treatment provides mild improvement of lipids. Risk factors for coronary artery disease include dyslipidemia, hypertension and a sedentary lifestyle.      Review of Systems  Constitutional:  Negative for malaise/fatigue.  Respiratory:  Negative for shortness of breath.   Musculoskeletal:  Positive for stiffness.  All other systems reviewed and are negative.  Family History  Problem Relation Age of Onset   Colon cancer Neg Hx    Colon polyps Neg Hx    Esophageal cancer Neg Hx    Rectal cancer Neg Hx    Stomach cancer Neg Hx    Social History   Socioeconomic History   Marital status: Married    Spouse name: Not on file   Number of children: Not  on file   Years of education: Not on file   Highest education level: 12th grade  Occupational History   Not on file  Tobacco Use   Smoking status: Former    Current packs/day: 0.00    Average packs/day: 0.5 packs/day for 2.0 years (1.0 ttl pk-yrs)    Types: Cigarettes    Start date: 01/16/1981    Quit date: 01/17/1983    Years since quitting: 40.8   Smokeless tobacco: Never  Vaping Use   Vaping status: Never Used  Substance and Sexual Activity   Alcohol use: No   Drug use: No   Sexual activity: Not on file  Other Topics Concern   Not on file  Social History Narrative   Not on file   Social Drivers of Health   Financial Resource Strain: Low Risk  (11/08/2023)   Overall Financial Resource Strain (CARDIA)    Difficulty of Paying Living Expenses: Not hard at all  Food Insecurity: No Food Insecurity (11/08/2023)   Hunger Vital Sign    Worried About Running Out of Food in the Last Year: Never true    Ran Out of Food in the Last Year: Never true  Transportation Needs: No Transportation Needs (11/08/2023)   PRAPARE - Administrator, Civil Service (Medical): No    Lack of Transportation (Non-Medical): No  Physical Activity: Sufficiently Active (11/08/2023)   Exercise Vital Sign    Days of Exercise per Week: 5 days    Minutes of  Exercise per Session: 40 min  Stress: Not on file  Social Connections: Moderately Integrated (11/08/2023)   Social Connection and Isolation Panel    Frequency of Communication with Friends and Family: More than three times a week    Frequency of Social Gatherings with Friends and Family: More than three times a week    Attends Religious Services: More than 4 times per year    Active Member of [employer] or Organizations: No    Attends Engineer, structural: Not on file    Marital Status: Married        Objective:   Physical Exam Vitals reviewed.  Constitutional:      General: He is not in acute distress.    Appearance: He is  well-developed.  HENT:     Head: Normocephalic.     Right Ear: Tympanic membrane normal.     Left Ear: Tympanic membrane normal.  Eyes:     General:        Right eye: No discharge.        Left eye: No discharge.     Pupils: Pupils are equal, round, and reactive to light.  Neck:     Thyroid : No thyromegaly.  Cardiovascular:     Rate and Rhythm: Normal rate and regular rhythm.     Heart sounds: Normal heart sounds. No murmur heard. Pulmonary:     Effort: Pulmonary effort is normal. No respiratory distress.     Breath sounds: Normal breath sounds. No wheezing.  Abdominal:     General: Bowel sounds are normal. There is no distension.     Palpations: Abdomen is soft.     Tenderness: There is no abdominal tenderness.  Musculoskeletal:        General: No tenderness. Normal range of motion.     Cervical back: Normal range of motion and neck supple.  Skin:    General: Skin is warm and dry.     Findings: No erythema or rash.  Neurological:     Mental Status: He is alert and oriented to person, place, and time.     Cranial Nerves: No cranial nerve deficit.     Deep Tendon Reflexes: Reflexes are normal and symmetric.  Psychiatric:        Behavior: Behavior normal.        Thought Content: Thought content normal.        Judgment: Judgment normal.       BP 138/75   Pulse 65   Temp 97.6 F (36.4 C) (Temporal)   Ht 6' (1.829 m)   Wt 235 lb 12.8 oz (107 kg)   SpO2 94%   BMI 31.98 kg/m   Assessment & Plan:  [name]. comes in today with chief complaint of Annual Exam   Diagnosis and orders addressed:  1. Essential hypertension - amLODipine -benazepril  (LOTREL) 5-10 MG capsule; Take 1 capsule by mouth daily.  Dispense: 90 capsule; Refill: 4 - metoprolol  succinate (TOPROL -XL) 25 MG 24 hr tablet; Take 1 tablet (25 mg total) by mouth daily.  Dispense: 90 tablet; Refill: 4 - CMP14+EGFR - CBC with Differential/Platelet  2. Encounter for immunization - Flu vaccine  trivalent PF, 6mos and older(Flulaval,Afluria,Fluarix,Fluzone) - CMP14+EGFR - CBC with Differential/Platelet  3. Mixed hyperlipidemia - CMP14+EGFR - CBC with Differential/Platelet - Lipid panel  4. Refusal of statin medication by patient - CMP14+EGFR - CBC with Differential/Platelet - Lipid panel  5. Paroxysmal atrial fibrillation (HCC) - CMP14+EGFR - CBC with Differential/Platelet  6. Primary  osteoarthritis involving multiple joints - CMP14+EGFR - CBC with Differential/Platelet  7. OSA (obstructive sleep apnea) - CMP14+EGFR - CBC with Differential/Platelet  8. Class 1 obesity with serious comorbidity and body mass index (BMI) of 31.0 to 31.9 in adult, unspecified obesity type - CMP14+EGFR - CBC with Differential/Platelet  9. H/O non-ST elevation myocardial infarction (NSTEMI) - CMP14+EGFR - CBC with Differential/Platelet  10. Annual physical exam (Primary) - CMP14+EGFR - CBC with Differential/Platelet - Lipid panel - PSA, total and free - TSH   Labs pending Continue current medications Encouraged to make Cardiologists  Health Maintenance reviewed Diet and exercise encouraged  Follow up plan: 1 year    Bari Learn, FNP

## 2023-11-09 NOTE — Patient Instructions (Signed)

## 2023-11-10 LAB — CBC WITH DIFFERENTIAL/PLATELET
Basophils Absolute: 0.1 x10E3/uL (ref 0.0–0.2)
Basos: 1 %
EOS (ABSOLUTE): 0.1 x10E3/uL (ref 0.0–0.4)
Eos: 2 %
Hematocrit: 50.3 % (ref 37.5–51.0)
Hemoglobin: 16.5 g/dL (ref 13.0–17.7)
Immature Grans (Abs): 0 x10E3/uL (ref 0.0–0.1)
Immature Granulocytes: 0 %
Lymphocytes Absolute: 2 x10E3/uL (ref 0.7–3.1)
Lymphs: 26 %
MCH: 30.2 pg (ref 26.6–33.0)
MCHC: 32.8 g/dL (ref 31.5–35.7)
MCV: 92 fL (ref 79–97)
Monocytes Absolute: 0.9 x10E3/uL (ref 0.1–0.9)
Monocytes: 11 %
Neutrophils Absolute: 4.7 x10E3/uL (ref 1.4–7.0)
Neutrophils: 60 %
Platelets: 251 x10E3/uL (ref 150–450)
RBC: 5.47 x10E6/uL (ref 4.14–5.80)
RDW: 12.8 % (ref 11.6–15.4)
WBC: 7.8 x10E3/uL (ref 3.4–10.8)

## 2023-11-10 LAB — CMP14+EGFR
ALT: 22 IU/L (ref 0–44)
AST: 21 IU/L (ref 0–40)
Albumin: 4.6 g/dL (ref 3.9–4.9)
Alkaline Phosphatase: 99 IU/L (ref 47–123)
BUN/Creatinine Ratio: 15 (ref 10–24)
BUN: 14 mg/dL (ref 8–27)
Bilirubin Total: 1.1 mg/dL (ref 0.0–1.2)
CO2: 21 mmol/L (ref 20–29)
Calcium: 9.2 mg/dL (ref 8.6–10.2)
Chloride: 103 mmol/L (ref 96–106)
Creatinine, Ser: 0.92 mg/dL (ref 0.76–1.27)
Globulin, Total: 2.3 g/dL (ref 1.5–4.5)
Glucose: 95 mg/dL (ref 70–99)
Potassium: 4.6 mmol/L (ref 3.5–5.2)
Sodium: 139 mmol/L (ref 134–144)
Total Protein: 6.9 g/dL (ref 6.0–8.5)
eGFR: 93 mL/min/1.73 (ref >59)

## 2023-11-10 LAB — TSH: TSH: 2.4 u[IU]/mL (ref 0.450–4.500)

## 2023-11-10 LAB — PSA, TOTAL AND FREE
PSA, Free Pct: 23.1 %
PSA, Free: 0.3 ng/mL
Prostate Specific Ag, Serum: 1.3 ng/mL (ref 0.0–4.0)

## 2023-11-10 LAB — LIPID PANEL
Chol/HDL Ratio: 4.4 ratio (ref 0.0–5.0)
Cholesterol, Total: 159 mg/dL (ref 100–199)
HDL: 36 mg/dL — ABNORMAL LOW (ref >39)
LDL Chol Calc (NIH): 102 mg/dL — ABNORMAL HIGH (ref 0–99)
Triglycerides: 112 mg/dL (ref 0–149)
VLDL Cholesterol Cal: 21 mg/dL (ref 5–40)

## 2023-11-12 ENCOUNTER — Ambulatory Visit: Payer: Self-pay | Admitting: Family

## 2024-11-10 ENCOUNTER — Encounter: Payer: Self-pay | Admitting: Family
